# Patient Record
Sex: Female | Born: 1945 | Race: White | Hispanic: No | Smoking: Former smoker
Health system: Southern US, Community
[De-identification: ages and names within clinical notes are randomized; demographics above are authoritative.]

## PROBLEM LIST (undated history)

## (undated) DIAGNOSIS — K439 Ventral hernia without obstruction or gangrene: Secondary | ICD-10-CM

## (undated) DIAGNOSIS — A4902 Methicillin resistant Staphylococcus aureus infection, unspecified site: Secondary | ICD-10-CM

## (undated) DIAGNOSIS — E039 Hypothyroidism, unspecified: Secondary | ICD-10-CM

## (undated) DIAGNOSIS — F419 Anxiety disorder, unspecified: Secondary | ICD-10-CM

## (undated) DIAGNOSIS — C50411 Malignant neoplasm of upper-outer quadrant of right female breast: Secondary | ICD-10-CM

## (undated) DIAGNOSIS — I10 Essential (primary) hypertension: Secondary | ICD-10-CM

## (undated) DIAGNOSIS — Z170 Estrogen receptor positive status [ER+]: Secondary | ICD-10-CM

## (undated) DIAGNOSIS — I499 Cardiac arrhythmia, unspecified: Secondary | ICD-10-CM

## (undated) DIAGNOSIS — F329 Major depressive disorder, single episode, unspecified: Secondary | ICD-10-CM

## (undated) DIAGNOSIS — M1990 Unspecified osteoarthritis, unspecified site: Secondary | ICD-10-CM

## (undated) DIAGNOSIS — R7303 Prediabetes: Secondary | ICD-10-CM

## (undated) DIAGNOSIS — C55 Malignant neoplasm of uterus, part unspecified: Secondary | ICD-10-CM

## (undated) DIAGNOSIS — F32A Depression, unspecified: Secondary | ICD-10-CM

## (undated) DIAGNOSIS — Z87442 Personal history of urinary calculi: Secondary | ICD-10-CM

## (undated) DIAGNOSIS — K219 Gastro-esophageal reflux disease without esophagitis: Secondary | ICD-10-CM

## (undated) DIAGNOSIS — E785 Hyperlipidemia, unspecified: Secondary | ICD-10-CM

## (undated) DIAGNOSIS — K8020 Calculus of gallbladder without cholecystitis without obstruction: Secondary | ICD-10-CM

## (undated) HISTORY — PX: BUNIONECTOMY: SHX129

## (undated) HISTORY — PX: TUBAL LIGATION: SHX77

## (undated) HISTORY — PX: CHOLECYSTECTOMY: SHX55

## (undated) HISTORY — DX: Unspecified osteoarthritis, unspecified site: M19.90

## (undated) HISTORY — PX: EXCISION NEUROMA: SHX6350

## (undated) HISTORY — DX: Essential (primary) hypertension: I10

## (undated) HISTORY — DX: Malignant neoplasm of uterus, part unspecified: C55

## (undated) HISTORY — PX: NASAL SINUS SURGERY: SHX719

## (undated) SURGERY — CHOLECYSTECTOMY, ROBOT-ASSISTED, LAPAROSCOPIC
Anesthesia: General

---

## 2004-04-22 LAB — HM PAP SMEAR: HM Pap smear: NORMAL

## 2004-09-12 DIAGNOSIS — A4902 Methicillin resistant Staphylococcus aureus infection, unspecified site: Secondary | ICD-10-CM

## 2004-09-12 HISTORY — DX: Methicillin resistant Staphylococcus aureus infection, unspecified site: A49.02

## 2005-09-29 ENCOUNTER — Other Ambulatory Visit: Payer: Self-pay

## 2005-09-29 ENCOUNTER — Ambulatory Visit: Payer: Self-pay | Admitting: Family Medicine

## 2005-10-07 ENCOUNTER — Ambulatory Visit: Payer: Self-pay | Admitting: Podiatry

## 2007-09-13 DIAGNOSIS — C55 Malignant neoplasm of uterus, part unspecified: Secondary | ICD-10-CM

## 2007-09-13 HISTORY — PX: ABDOMINAL HYSTERECTOMY: SHX81

## 2007-09-13 HISTORY — DX: Malignant neoplasm of uterus, part unspecified: C55

## 2007-11-11 ENCOUNTER — Ambulatory Visit: Payer: Self-pay | Admitting: Gynecologic Oncology

## 2008-01-01 ENCOUNTER — Ambulatory Visit: Payer: Self-pay | Admitting: Gynecologic Oncology

## 2008-01-01 ENCOUNTER — Ambulatory Visit: Payer: Self-pay | Admitting: Radiation Oncology

## 2008-01-11 ENCOUNTER — Ambulatory Visit: Payer: Self-pay | Admitting: Radiation Oncology

## 2008-01-29 ENCOUNTER — Ambulatory Visit: Payer: Self-pay | Admitting: Radiation Oncology

## 2008-02-11 ENCOUNTER — Ambulatory Visit: Payer: Self-pay | Admitting: Radiation Oncology

## 2008-03-12 ENCOUNTER — Ambulatory Visit: Payer: Self-pay | Admitting: Radiation Oncology

## 2008-04-12 ENCOUNTER — Ambulatory Visit: Payer: Self-pay | Admitting: Radiation Oncology

## 2008-04-22 ENCOUNTER — Ambulatory Visit: Payer: Self-pay | Admitting: Gynecologic Oncology

## 2008-06-25 ENCOUNTER — Ambulatory Visit: Payer: Self-pay

## 2008-08-11 ENCOUNTER — Ambulatory Visit: Payer: Self-pay | Admitting: Podiatry

## 2008-08-12 ENCOUNTER — Ambulatory Visit: Payer: Self-pay | Admitting: Gynecologic Oncology

## 2008-08-26 ENCOUNTER — Ambulatory Visit: Payer: Self-pay | Admitting: Gynecologic Oncology

## 2008-09-12 ENCOUNTER — Ambulatory Visit: Payer: Self-pay | Admitting: Gynecologic Oncology

## 2008-09-16 ENCOUNTER — Encounter: Payer: Self-pay | Admitting: Podiatry

## 2008-10-09 ENCOUNTER — Ambulatory Visit: Payer: Self-pay | Admitting: Radiation Oncology

## 2008-10-13 ENCOUNTER — Ambulatory Visit: Payer: Self-pay | Admitting: Gynecologic Oncology

## 2009-01-20 ENCOUNTER — Ambulatory Visit: Payer: Self-pay | Admitting: Gynecologic Oncology

## 2009-02-10 ENCOUNTER — Ambulatory Visit: Payer: Self-pay | Admitting: Gynecologic Oncology

## 2009-03-12 ENCOUNTER — Ambulatory Visit: Payer: Self-pay | Admitting: Radiation Oncology

## 2009-03-19 ENCOUNTER — Ambulatory Visit: Payer: Self-pay | Admitting: Radiation Oncology

## 2009-04-12 ENCOUNTER — Ambulatory Visit: Payer: Self-pay | Admitting: Radiation Oncology

## 2009-05-26 LAB — HM DEXA SCAN

## 2009-07-13 ENCOUNTER — Ambulatory Visit: Payer: Self-pay | Admitting: Gynecologic Oncology

## 2009-07-21 ENCOUNTER — Ambulatory Visit: Payer: Self-pay | Admitting: Radiation Oncology

## 2009-08-03 ENCOUNTER — Ambulatory Visit: Payer: Self-pay | Admitting: Podiatry

## 2009-08-12 ENCOUNTER — Ambulatory Visit: Payer: Self-pay | Admitting: Radiation Oncology

## 2009-08-24 ENCOUNTER — Ambulatory Visit: Payer: Self-pay | Admitting: Podiatry

## 2009-09-30 ENCOUNTER — Ambulatory Visit: Payer: Self-pay | Admitting: Podiatry

## 2009-12-28 ENCOUNTER — Ambulatory Visit: Payer: Self-pay | Admitting: Family Medicine

## 2009-12-29 ENCOUNTER — Ambulatory Visit: Payer: Self-pay | Admitting: Family Medicine

## 2010-01-10 ENCOUNTER — Ambulatory Visit: Payer: Self-pay | Admitting: Gynecologic Oncology

## 2010-01-19 ENCOUNTER — Ambulatory Visit: Payer: Self-pay | Admitting: Gynecologic Oncology

## 2010-02-10 ENCOUNTER — Ambulatory Visit: Payer: Self-pay | Admitting: Gynecologic Oncology

## 2010-07-22 ENCOUNTER — Ambulatory Visit: Payer: Self-pay | Admitting: Radiation Oncology

## 2010-08-12 ENCOUNTER — Ambulatory Visit: Payer: Self-pay | Admitting: Radiation Oncology

## 2011-01-13 ENCOUNTER — Ambulatory Visit: Payer: Self-pay | Admitting: Family Medicine

## 2011-01-25 ENCOUNTER — Ambulatory Visit: Payer: Self-pay | Admitting: Radiation Oncology

## 2011-02-11 ENCOUNTER — Ambulatory Visit: Payer: Self-pay | Admitting: Radiation Oncology

## 2011-02-21 ENCOUNTER — Encounter: Payer: Self-pay | Admitting: Gynecologic Oncology

## 2011-03-13 ENCOUNTER — Encounter: Payer: Self-pay | Admitting: Gynecologic Oncology

## 2011-07-28 ENCOUNTER — Ambulatory Visit: Payer: Self-pay | Admitting: Radiation Oncology

## 2011-08-13 ENCOUNTER — Ambulatory Visit: Payer: Self-pay | Admitting: Radiation Oncology

## 2011-08-24 LAB — LIPID PANEL
Cholesterol: 197 mg/dL (ref 0–200)
HDL: 63 mg/dL (ref 35–70)
LDL Cholesterol: 102 mg/dL
LDl/HDL Ratio: 1.6
Triglycerides: 158 mg/dL (ref 40–160)

## 2011-09-23 ENCOUNTER — Emergency Department: Payer: Self-pay | Admitting: Emergency Medicine

## 2011-09-26 ENCOUNTER — Ambulatory Visit: Payer: Self-pay | Admitting: Family Medicine

## 2011-09-29 ENCOUNTER — Ambulatory Visit: Payer: Self-pay | Admitting: Family Medicine

## 2011-10-04 ENCOUNTER — Ambulatory Visit: Payer: Self-pay | Admitting: Family Medicine

## 2012-01-31 ENCOUNTER — Ambulatory Visit: Payer: Self-pay | Admitting: Gynecologic Oncology

## 2012-02-11 ENCOUNTER — Ambulatory Visit: Payer: Self-pay | Admitting: Gynecologic Oncology

## 2012-03-25 ENCOUNTER — Emergency Department: Payer: Self-pay | Admitting: *Deleted

## 2012-03-25 LAB — CBC
HCT: 44 % (ref 35.0–47.0)
HGB: 14.7 g/dL (ref 12.0–16.0)
MCH: 30.2 pg (ref 26.0–34.0)
MCHC: 33.3 g/dL (ref 32.0–36.0)
MCV: 91 fL (ref 80–100)
Platelet: 198 10*3/uL (ref 150–440)
RBC: 4.85 10*6/uL (ref 3.80–5.20)
RDW: 14 % (ref 11.5–14.5)
WBC: 8.3 10*3/uL (ref 3.6–11.0)

## 2012-03-25 LAB — COMPREHENSIVE METABOLIC PANEL
Albumin: 4 g/dL (ref 3.4–5.0)
Alkaline Phosphatase: 92 U/L (ref 50–136)
Anion Gap: 9 (ref 7–16)
BUN: 13 mg/dL (ref 7–18)
Bilirubin,Total: 0.3 mg/dL (ref 0.2–1.0)
Calcium, Total: 8.8 mg/dL (ref 8.5–10.1)
Chloride: 106 mmol/L (ref 98–107)
Co2: 28 mmol/L (ref 21–32)
Creatinine: 0.89 mg/dL (ref 0.60–1.30)
EGFR (African American): >60
EGFR (Non-African Amer.): >60
Glucose: 133 mg/dL — ABNORMAL HIGH (ref 65–99)
Osmolality: 287 (ref 275–301)
Potassium: 3.6 mmol/L (ref 3.5–5.1)
SGOT(AST): 28 U/L (ref 15–37)
SGPT (ALT): 25 U/L
Sodium: 143 mmol/L (ref 136–145)
Total Protein: 7.8 g/dL (ref 6.4–8.2)

## 2012-03-25 LAB — CK TOTAL AND CKMB (NOT AT ARMC)
CK, Total: 66 U/L (ref 21–215)
CK-MB: <0.5 ng/mL — ABNORMAL LOW (ref 0.5–3.6)

## 2012-03-25 LAB — TSH: Thyroid Stimulating Horm: 3.8 u[IU]/mL

## 2012-03-25 LAB — TROPONIN I: Troponin-I: <0.02 ng/mL

## 2012-09-11 ENCOUNTER — Ambulatory Visit: Payer: Self-pay | Admitting: Gynecologic Oncology

## 2012-09-12 ENCOUNTER — Ambulatory Visit: Payer: Self-pay | Admitting: Gynecologic Oncology

## 2012-11-05 ENCOUNTER — Ambulatory Visit: Payer: Self-pay | Admitting: Family Medicine

## 2013-07-15 ENCOUNTER — Ambulatory Visit: Payer: Self-pay | Admitting: Unknown Physician Specialty

## 2013-07-15 LAB — HM COLONOSCOPY

## 2013-07-16 LAB — PATHOLOGY REPORT

## 2014-05-08 ENCOUNTER — Ambulatory Visit: Payer: Self-pay

## 2014-05-16 ENCOUNTER — Ambulatory Visit: Payer: Self-pay | Admitting: Physician Assistant

## 2014-05-26 ENCOUNTER — Ambulatory Visit: Payer: Self-pay | Admitting: Family Medicine

## 2014-05-30 ENCOUNTER — Ambulatory Visit: Payer: Self-pay | Admitting: Family Medicine

## 2014-08-13 LAB — HEPATIC FUNCTION PANEL
ALT: 26 U/L (ref 7–35)
AST: 29 U/L (ref 13–35)
Alkaline Phosphatase: 74 U/L (ref 25–125)

## 2014-08-13 LAB — BASIC METABOLIC PANEL
BUN: 14 mg/dL (ref 4–21)
Creatinine: 1 mg/dL (ref 0.5–1.1)
Glucose: 155 mg/dL
Potassium: 4 mmol/L (ref 3.4–5.3)
Sodium: 141 mmol/L (ref 137–147)

## 2014-08-13 LAB — TSH: TSH: 3.42 u[IU]/mL (ref 0.41–5.90)

## 2014-08-13 LAB — CBC AND DIFFERENTIAL
HCT: 46 % (ref 36–46)
Hemoglobin: 15.2 g/dL (ref 12.0–16.0)
Neutrophils Absolute: 5 /uL
Platelets: 249 10*3/uL (ref 150–399)
WBC: 7.5 10^3/mL

## 2015-02-25 ENCOUNTER — Other Ambulatory Visit: Payer: Self-pay

## 2015-02-25 DIAGNOSIS — I10 Essential (primary) hypertension: Secondary | ICD-10-CM

## 2015-02-25 MED ORDER — QUINAPRIL HCL 40 MG PO TABS: 40.0000 mg | ORAL_TABLET | Freq: Every day | ORAL | Status: DC

## 2015-02-26 MED ORDER — QUINAPRIL HCL 40 MG PO TABS: 40.0000 mg | ORAL_TABLET | Freq: Every day | ORAL | Status: DC

## 2015-03-20 DIAGNOSIS — E039 Hypothyroidism, unspecified: Secondary | ICD-10-CM | POA: Insufficient documentation

## 2015-03-20 DIAGNOSIS — F32A Depression, unspecified: Secondary | ICD-10-CM | POA: Insufficient documentation

## 2015-03-20 DIAGNOSIS — F419 Anxiety disorder, unspecified: Secondary | ICD-10-CM | POA: Insufficient documentation

## 2015-03-20 DIAGNOSIS — M8580 Other specified disorders of bone density and structure, unspecified site: Secondary | ICD-10-CM | POA: Insufficient documentation

## 2015-03-20 DIAGNOSIS — C549 Malignant neoplasm of corpus uteri, unspecified: Secondary | ICD-10-CM | POA: Insufficient documentation

## 2015-03-20 DIAGNOSIS — G4700 Insomnia, unspecified: Secondary | ICD-10-CM | POA: Insufficient documentation

## 2015-03-20 DIAGNOSIS — F329 Major depressive disorder, single episode, unspecified: Secondary | ICD-10-CM | POA: Insufficient documentation

## 2015-03-20 DIAGNOSIS — I10 Essential (primary) hypertension: Secondary | ICD-10-CM | POA: Insufficient documentation

## 2015-03-20 DIAGNOSIS — D126 Benign neoplasm of colon, unspecified: Secondary | ICD-10-CM | POA: Insufficient documentation

## 2015-03-20 DIAGNOSIS — M1990 Unspecified osteoarthritis, unspecified site: Secondary | ICD-10-CM | POA: Insufficient documentation

## 2015-03-20 DIAGNOSIS — E785 Hyperlipidemia, unspecified: Secondary | ICD-10-CM | POA: Insufficient documentation

## 2015-04-21 ENCOUNTER — Ambulatory Visit (INDEPENDENT_AMBULATORY_CARE_PROVIDER_SITE_OTHER): Payer: Medicare Other | Admitting: Family Medicine

## 2015-04-21 ENCOUNTER — Encounter: Payer: Self-pay | Admitting: Family Medicine

## 2015-04-21 VITALS — BP 122/68 | HR 76 | Temp 98.0°F | Resp 16 | Wt 198.0 lb

## 2015-04-21 DIAGNOSIS — I10 Essential (primary) hypertension: Secondary | ICD-10-CM | POA: Diagnosis not present

## 2015-04-21 DIAGNOSIS — E785 Hyperlipidemia, unspecified: Secondary | ICD-10-CM | POA: Diagnosis not present

## 2015-04-21 DIAGNOSIS — E039 Hypothyroidism, unspecified: Secondary | ICD-10-CM | POA: Diagnosis not present

## 2015-04-21 NOTE — Progress Notes (Signed)
Patient ID: Rebecca Patton, female   DOB: 08-21-1946, 69 y.o.   MRN: [id number] Patient: Rebecca Patton, Female    DOB: 1946/06/28, 69 y.o.   MRN: [id number] Visit Date: 04/21/2015  Today's Provider: [name], MD   Subjective:  1. Essential hypertension Patient is a 69 year old female who presents for follow up of her hypertension.  Her last visit was 10/15/14.  Her pressure at that time was 126/68.  No changes in management at that time.  She did complain that her feet were swelling on the Amlodipine and in April she discontinued taking it.  She has not noticed any change in her blood pressure. Her readings from outside the office are running 130s-150s over 70s-90.  She is currently taking Quinapril and HCTZ.  She reports good compliance and tolerance.     2. Hypothyroidism, unspecified hypothyroidism type She is also here for follow up on her thyroid.  Her last TSH was 08/13/14.  She is currently on Synthroid and does not know of any symptoms related to that disease.  - TSH  3. Hyperlipidemia Patient has not had her lipids done since 2012.  She is not currently on any medication but has had elevated lipids in the past that have never required medication.    - Lipid Panel With LDL/HDL Ratio   Review of Systems  Constitutional: Negative for fever, chills, diaphoresis, fatigue and unexpected weight change.  Eyes: Negative.   Respiratory: Negative.  Negative for cough, choking, chest tightness, shortness of breath and wheezing.   Cardiovascular: Positive for leg swelling. Negative for chest pain and palpitations.  Endocrine: Negative for cold intolerance, heat intolerance, polydipsia, polyphagia and polyuria.  Neurological: Positive for light-headedness (Pastient believes this is from her eye glasses.).  Psychiatric/Behavioral: Negative.     History   Social History  . Marital Status: Married    Spouse Name: N/A  . Number of Children: N/A  . Years of Education: N/A   Occupational  History  . Not on file.   Social History Main Topics  . Smoking status: Former Smoker    Quit date: 09/11/1997  . Smokeless tobacco: Not on file  . Alcohol Use: No  . Drug Use: No  . Sexual Activity: Not on file   Other Topics Concern  . Not on file   Social History Narrative    Patient Active Problem List   Diagnosis Date Noted  . Anxiety 03/20/2015  . Arthritis 03/20/2015  . 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 Clinical depression 03/20/2015  . Malignant neoplasm of corpus uteri, except isthmus 03/20/2015  . Essential (primary) hypertension 03/20/2015  . HLD (hyperlipidemia) 03/20/2015  . Cannot sleep 03/20/2015  . Osteopenia 03/20/2015  . Adult hypothyroidism 03/20/2015    Past Surgical History  Procedure Laterality Date  . Abdominal hysterectomy  2009     BSO  . Nasal sinus surgery    . Excision neuroma    . Bunionectomy    . Tubal ligation      Her family history includes CAD in her father; Cancer in her sister; Diabetes in her father; Pancreatic cancer in her brother; Parkinson's disease in her mother; Tremor in her brother.    Outpatient Prescriptions Prior to Visit  Medication Sig Dispense Refill  . aspirin 81 MG tablet Take by mouth.    . hydrochlorothiazide (HYDRODIURIL) 25 MG tablet Take by mouth.    . MULTIPLE VITAMIN PO Take by mouth.    . OMEGA-3 FATTY  ACIDS PO Take by mouth.    . quinapril (ACCUPRIL) 40 MG tablet Take 1 tablet (40 mg total) by mouth daily. 90 tablet 3  . amLODipine (NORVASC) 5 MG tablet Take by mouth.    . levothyroxine (SYNTHROID) 50 MCG tablet Take by mouth.    . meloxicam (MOBIC) 15 MG tablet Take by mouth.    . Pyridoxine HCl (VITAMIN B6) 250 MG TABS Take by mouth.     No facility-administered medications prior to visit.    Patient Care Team: Jerrol Banana., MD as PCP - General (Family Medicine)     Objective:   Vitals:  Filed Vitals:   04/21/15 1446  BP: 122/68  Pulse: 76  Temp: 98 F (36.7 C)   TempSrc: Oral  Resp: 16  Weight: 198 lb (89.812 kg)    Physical Exam  Constitutional: She is oriented to person, place, and time. She appears well-developed and well-nourished.  HENT:  Head: Normocephalic and atraumatic.  Right Ear: External ear normal.  Left Ear: External ear normal.  Nose: Nose normal.  Eyes: Conjunctivae are normal.  Neck: Neck supple.  Cardiovascular: Normal rate, regular rhythm and normal heart sounds.   Pulmonary/Chest: Effort normal and breath sounds normal.  Abdominal: Soft.  Neurological: She is alert and oriented to person, place, and time.  Skin: Skin is warm and dry.  Psychiatric: She has a normal mood and affect. Her behavior is normal. Judgment and thought content normal.       Assessment & Plan:   HTN HLD Hypothyroid Check labs. I have done the exam and reviewed the above chart and it is accurate to the best of my knowledge.  Exercise Activities and Dietary recommendations Goals    None      Immunization History  Administered Date(s) Administered  . Pneumococcal Conjugate-13 08/13/2013  . Zoster 07/03/2012    Health Maintenance  Topic Date Due  . Hepatitis C Screening  06-24-1946  . TETANUS/TDAP  12/20/1966  . MAMMOGRAM  12/19/1997  . COLONOSCOPY  12/19/1997  . PNA vac Low Risk Adult (2 of 2 - PPSV23) 08/13/2014  . INFLUENZA VACCINE  04/13/2015  . DEXA SCAN  Completed  . ZOSTAVAX  Completed       ------------------------------------------------------------------------------------------------------------

## 2015-04-23 LAB — CBC WITH DIFFERENTIAL/PLATELET

## 2015-04-23 LAB — LIPID PANEL WITH LDL/HDL RATIO
Cholesterol, Total: 225 mg/dL — ABNORMAL HIGH (ref 100–199)
HDL: 60 mg/dL (ref >39)
LDL Calculated: 128 mg/dL — ABNORMAL HIGH (ref 0–99)
LDl/HDL Ratio: 2.1 ratio units (ref 0.0–3.2)
Triglycerides: 184 mg/dL — ABNORMAL HIGH (ref 0–149)
VLDL Cholesterol Cal: 37 mg/dL (ref 5–40)

## 2015-04-23 LAB — TSH: TSH: 3.09 u[IU]/mL (ref 0.450–4.500)

## 2015-10-06 ENCOUNTER — Encounter: Payer: Self-pay | Admitting: Obstetrics and Gynecology

## 2015-10-06 ENCOUNTER — Ambulatory Visit (INDEPENDENT_AMBULATORY_CARE_PROVIDER_SITE_OTHER): Payer: Medicare Other | Admitting: Obstetrics and Gynecology

## 2015-10-06 VITALS — BP 146/65 | HR 78 | Ht 66.0 in | Wt 202.9 lb

## 2015-10-06 DIAGNOSIS — Z01419 Encounter for gynecological examination (general) (routine) without abnormal findings: Secondary | ICD-10-CM | POA: Diagnosis not present

## 2015-10-06 NOTE — Patient Instructions (Signed)
  Place annual gynecologic exam patient instructions here.  Thank you for enrolling in [facility]. Please follow the instructions below to securely access your online medical record. MyChart allows you to send messages to your doctor, view your test results, manage appointments, and more.   How Do I Sign Up? 1. In your Internet browser, go to [employer] and enter [email or web address]. 2. Click on the Sign Up Now link in the Sign In box. You will see the New Member Sign Up page. 3. Enter your MyChart Access Code exactly as it appears below. You will not need to use this code after you've completed the sign-up process. If you do not sign up before the expiration date, you must request a new code.  MyChart Access Code: [email or web address] Expires: 12/05/2015  1:59 PM  4. Enter your Social Security Number ([id number]) and Date of Birth (mm/dd/yyyy) as indicated and click Submit. You will be taken to the next sign-up page. 5. Create a MyChart ID. This will be your MyChart login ID and cannot be changed, so think of one that is secure and easy to remember. 6. Create a MyChart password. You can change your password at any time. 7. Enter your Password Reset Question and Answer. This can be used at a later time if you forget your password.  8. Enter your e-mail address. You will receive e-mail notification when new information is available in [place]. 9. Click Sign Up. You can now view your medical record.   Additional Information Remember, MyChart is NOT to be used for urgent needs. For medical emergencies, dial 911.

## 2015-10-06 NOTE — Progress Notes (Signed)
Subjective:   Rebecca Patton is a 70 y.o. No obstetric history on file. Caucasian female here for a routine well-woman exam.  No LMP recorded. Patient has had a hysterectomy.    Current complaints: none PCP: Rosanna Randy       Doesn't need labs as PCP did them in Aug. 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  Past Medical History History reviewed. No pertinent past medical history.  Past Surgical History Past Surgical History  Procedure Laterality Date  . Abdominal hysterectomy  2009     BSO  . Nasal sinus surgery    . Excision neuroma    . Bunionectomy    . Tubal ligation      Gynecologic History No obstetric history on file.  No LMP recorded. Patient has had a hysterectomy. Contraception: status post hysterectomy Last Pap: 2015. Results were: normal Last mammogram: 2016. Results were: normal   Obstetric History OB History  No data available    Current Medications Current Outpatient Prescriptions on File Prior to Visit  Medication Sig Dispense Refill  . aspirin 81 MG tablet Take by mouth.    . hydrochlorothiazide (HYDRODIURIL) 25 MG tablet Take by mouth.    . levothyroxine (SYNTHROID) 50 MCG tablet Take by mouth.    . MULTIPLE VITAMIN PO Take by mouth.    . OMEGA-3 FATTY ACIDS PO Take by mouth.    . Pyridoxine HCl (VITAMIN B6) 250 MG TABS Take by mouth.    . quinapril (ACCUPRIL) 40 MG tablet Take 1 tablet (40 mg total) by mouth daily. 90 tablet 3  . amLODipine (NORVASC) 5 MG tablet Take by mouth. Reported on 10/06/2015    . meloxicam (MOBIC) 15 MG tablet Take by  mouth. Reported on 10/06/2015     No current facility-administered medications on file prior to visit.    Review of Systems Patient denies any headaches, blurred vision, shortness of breath, chest pain, abdominal pain, problems with bowel movements, urination, or intercourse.  Objective:  BP 146/65 mmHg  Pulse 78  Ht 5\' 6"  (1.676 m)  Wt 202 lb 14.4 oz (92.035 kg)  BMI 32.76 kg/m2 Physical Exam  General:  Well developed, well nourished, no acute distress. She is alert and oriented x3. Skin:  Warm and dry Neck:  Midline trachea, no thyromegaly or nodules Cardiovascular: Regular rate and rhythm, no murmur heard Lungs:  Effort normal, all lung fields clear to auscultation bilaterally Breasts:  No dominant palpable mass, retraction, or nipple discharge Abdomen:  Soft, non tender, no hepatosplenomegaly or masses Pelvic:  External genitalia is normal in appearance.  The vagina is atrophic and shortened. The cervix is surgically absent.  Thin prep pap is not done  Uterus is surgically absent  No adnexal masses or tenderness noted. Extremities:  No swelling or varicosities noted Psych:  She has a normal mood and affect  Assessment:   Healthy well-woman exam S/p complete hysterectomy  Plan:  No labs neded F/U 1 year for AE, or sooner if needed Mammogram scheduled  Johnnye Sandford Rockney Ghee, CNM

## 2015-10-27 ENCOUNTER — Encounter: Payer: Medicare Other | Admitting: Family Medicine

## 2015-10-28 ENCOUNTER — Other Ambulatory Visit: Payer: Self-pay | Admitting: Family Medicine

## 2015-11-02 ENCOUNTER — Other Ambulatory Visit: Payer: Self-pay

## 2015-11-02 MED ORDER — HYDROCHLOROTHIAZIDE 25 MG PO TABS: 25.0000 mg | ORAL_TABLET | Freq: Every day | ORAL | Status: DC

## 2015-11-05 ENCOUNTER — Encounter: Payer: Self-pay | Admitting: Family Medicine

## 2015-11-05 ENCOUNTER — Ambulatory Visit (INDEPENDENT_AMBULATORY_CARE_PROVIDER_SITE_OTHER): Payer: Medicare Other | Admitting: Family Medicine

## 2015-11-05 VITALS — BP 130/72 | HR 72 | Temp 98.0°F | Resp 18 | Wt 205.0 lb

## 2015-11-05 DIAGNOSIS — E785 Hyperlipidemia, unspecified: Secondary | ICD-10-CM | POA: Diagnosis not present

## 2015-11-05 DIAGNOSIS — E039 Hypothyroidism, unspecified: Secondary | ICD-10-CM

## 2015-11-05 DIAGNOSIS — I10 Essential (primary) hypertension: Secondary | ICD-10-CM | POA: Diagnosis not present

## 2015-11-05 NOTE — Progress Notes (Signed)
Patient ID: Rebecca Patton, female   DOB: 10-28-1945, 70 y.o.   MRN: [id number]    Subjective:  HPI  Hypertension, follow-up:  BP Readings from Last 3 Encounters:  11/05/15 130/72  10/06/15 146/65  04/21/15 122/68    She was last seen for hypertension 6 months ago.  BP at that visit was 146/55. Management since that visit includes none. She reports good compliance with treatment. She is not having side effects.  She is exercising. She is adherent to low salt diet.   Outside blood pressures are 130-140's/60's. She is experiencing none.  Patient denies chest pain, chest pressure/discomfort, claudication, dyspnea, exertional chest pressure/discomfort, fatigue, lower extremity edema, orthopnea and palpitations.     Wt Readings from Last 3 Encounters:  11/05/15 205 lb (92.987 kg)  10/06/15 202 lb 14.4 oz (92.035 kg)  04/21/15 198 lb (89.812 kg)   -----------------------------------------------------------------------     Prior to Admission medications   Medication Sig Start Date End Date Taking? Authorizing Provider  amLODipine (NORVASC) 5 MG tablet Take by mouth. Reported on 10/06/2015 08/13/14  Yes Historical Provider, MD  aspirin 81 MG tablet Take by mouth.   Yes Historical Provider, MD  hydrochlorothiazide (HYDRODIURIL) 25 MG tablet Take 1 tablet (25 mg total) by mouth daily. 11/02/15  Yes Richard Maceo Pro., MD  levothyroxine (SYNTHROID) 50 MCG tablet Take by mouth. 12/02/14  Yes Historical Provider, MD  meloxicam (MOBIC) 15 MG tablet Take by mouth. Reported on 10/06/2015 10/15/14  Yes Historical Provider, MD  MULTIPLE VITAMIN PO Take by mouth.   Yes Historical Provider, MD  OMEGA-3 FATTY ACIDS PO Take by mouth.   Yes Historical Provider, MD  Pyridoxine HCl (VITAMIN B6) 250 MG TABS Take by mouth daily.    Yes Historical Provider, MD  quinapril (ACCUPRIL) 40 MG tablet Take 1 tablet (40 mg total) by mouth daily. 02/26/15  Yes Richard Maceo Pro., MD    Patient Active Problem  List   Diagnosis Date Noted  . Anxiety 03/20/2015  . Arthritis 03/20/2015  . 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 Clinical depression 03/20/2015  . Malignant neoplasm of corpus uteri, except isthmus ([place]) 03/20/2015  . Essential (primary) hypertension 03/20/2015  . HLD (hyperlipidemia) 03/20/2015  . Cannot sleep 03/20/2015  . Osteopenia 03/20/2015  . Adult hypothyroidism 03/20/2015    History reviewed. No pertinent past medical history.  Social History   Social History  . Marital Status: Married    Spouse Name: N/A  . Number of Children: N/A  . Years of Education: N/A   Occupational History  . Not on file.   Social History Main Topics  . Smoking status: Former Smoker    Quit date: 09/11/1997  . Smokeless tobacco: Not on file  . Alcohol Use: No  . Drug Use: No  . Sexual Activity: Not Currently   Other Topics Concern  . Not on file   Social History Narrative    No Known Allergies  Review of Systems  Constitutional: Negative.   HENT: Negative.   Eyes: Negative.   Respiratory: Negative.   Cardiovascular: Positive for leg swelling.  Gastrointestinal: Negative.   Genitourinary: Negative.   Musculoskeletal: Positive for back pain.  Skin: Negative.   Neurological: Negative.   Endo/Heme/Allergies: Negative.   Psychiatric/Behavioral: Negative.     Immunization History  Administered Date(s) Administered  . Influenza-Unspecified 06/13/2015  . Pneumococcal Conjugate-13 08/13/2013  . Zoster 07/03/2012   Objective:  BP 130/72 mmHg  Pulse 72  Temp(Src) 98 F (  36.7 C) (Oral)  Resp 18  Wt 205 lb (92.987 kg)  Physical Exam  Constitutional: She is oriented to person, place, and time and well-developed, well-nourished, and in no distress.  Eyes: Conjunctivae and EOM are normal. Pupils are equal, round, and reactive to light.  Neck: Normal range of motion. Neck supple.  Cardiovascular: Normal rate, regular rhythm, normal heart sounds and intact distal pulses.     Pulmonary/Chest: Effort normal and breath sounds normal.  Musculoskeletal: Normal range of motion.  Neurological: She is alert and oriented to person, place, and time. She has normal reflexes. Gait normal. GCS score is 15.  Skin: Skin is warm and dry.  Psychiatric: Mood, memory, affect and judgment normal.    Lab Results  Component Value Date   WBC CANCELED 04/21/2015   HGB 15.2 08/13/2014   HCT CANCELED 04/21/2015   PLT CANCELED 04/21/2015   GLUCOSE 133* 03/25/2012   CHOL 225* 04/21/2015   TRIG 184* 04/21/2015   HDL 60 04/21/2015   LDLCALC 128* 04/21/2015   TSH 3.090 04/21/2015    CMP     Component Value Date/Time   NA 141 08/13/2014   NA 143 03/25/2012 2049   K 4.0 08/13/2014   K 3.6 03/25/2012 2049   CL 106 03/25/2012 2049   CO2 28 03/25/2012 2049   GLUCOSE 133* 03/25/2012 2049   BUN 14 08/13/2014   BUN 13 03/25/2012 2049   CREATININE 1.0 08/13/2014   CREATININE 0.89 03/25/2012 2049   CALCIUM 8.8 03/25/2012 2049   PROT 7.8 03/25/2012 2049   ALBUMIN 4.0 03/25/2012 2049   AST 29 08/13/2014   AST 28 03/25/2012 2049   ALT 26 08/13/2014   ALT 25 03/25/2012 2049   ALKPHOS 74 08/13/2014   ALKPHOS 92 03/25/2012 2049   BILITOT 0.3 03/25/2012 2049   GFRNONAA >60 03/25/2012 2049   GFRAA >60 03/25/2012 2049    Assessment and Plan :  1. Hypothyroidism, unspecified hypothyroidism type  - TSH  2. Essential (primary) hypertension Stable. - CBC with Differential/Platelet - Comprehensive metabolic panel  3. HLD (hyperlipidemia)  - Lipid Panel With LDL/HDL Ratio   Patient was seen and examined by Dr. Miguel Aschoff, and noted scribed by Webb Laws, [place] MD [facility] 11/05/2015 1:50 PM

## 2015-11-07 LAB — CBC WITH DIFFERENTIAL/PLATELET
Basophils Absolute: 0 10*3/uL (ref 0.0–0.2)
Basos: 0 %
EOS (ABSOLUTE): 0.1 10*3/uL (ref 0.0–0.4)
Eos: 2 %
Hematocrit: 42.8 % (ref 34.0–46.6)
Hemoglobin: 14.5 g/dL (ref 11.1–15.9)
Immature Grans (Abs): 0 10*3/uL (ref 0.0–0.1)
Immature Granulocytes: 0 %
Lymphocytes Absolute: 1.5 10*3/uL (ref 0.7–3.1)
Lymphs: 25 %
MCH: 29.1 pg (ref 26.6–33.0)
MCHC: 33.9 g/dL (ref 31.5–35.7)
MCV: 86 fL (ref 79–97)
Monocytes Absolute: 0.4 10*3/uL (ref 0.1–0.9)
Monocytes: 8 %
Neutrophils Absolute: 3.8 10*3/uL (ref 1.4–7.0)
Neutrophils: 65 %
Platelets: 245 10*3/uL (ref 150–379)
RBC: 4.98 x10E6/uL (ref 3.77–5.28)
RDW: 13.9 % (ref 12.3–15.4)
WBC: 5.9 10*3/uL (ref 3.4–10.8)

## 2015-11-07 LAB — LIPID PANEL WITH LDL/HDL RATIO
Cholesterol, Total: 217 mg/dL — ABNORMAL HIGH (ref 100–199)
HDL: 58 mg/dL (ref >39)
LDL Calculated: 126 mg/dL — ABNORMAL HIGH (ref 0–99)
LDl/HDL Ratio: 2.2 ratio units (ref 0.0–3.2)
Triglycerides: 164 mg/dL — ABNORMAL HIGH (ref 0–149)
VLDL Cholesterol Cal: 33 mg/dL (ref 5–40)

## 2015-11-07 LAB — COMPREHENSIVE METABOLIC PANEL
ALT: 22 IU/L (ref 0–32)
AST: 24 IU/L (ref 0–40)
Albumin/Globulin Ratio: 1.5 (ref 1.1–2.5)
Albumin: 4.1 g/dL (ref 3.6–4.8)
Alkaline Phosphatase: 72 IU/L (ref 39–117)
BUN/Creatinine Ratio: 18 (ref 11–26)
BUN: 17 mg/dL (ref 8–27)
Bilirubin Total: 0.3 mg/dL (ref 0.0–1.2)
CO2: 25 mmol/L (ref 18–29)
Calcium: 9.6 mg/dL (ref 8.7–10.3)
Chloride: 99 mmol/L (ref 96–106)
Creatinine, Ser: 0.95 mg/dL (ref 0.57–1.00)
GFR calc Af Amer: 72 mL/min/{1.73_m2} (ref >59)
GFR calc non Af Amer: 62 mL/min/{1.73_m2} (ref >59)
Globulin, Total: 2.7 g/dL (ref 1.5–4.5)
Glucose: 104 mg/dL — ABNORMAL HIGH (ref 65–99)
Potassium: 4.1 mmol/L (ref 3.5–5.2)
Sodium: 141 mmol/L (ref 134–144)
Total Protein: 6.8 g/dL (ref 6.0–8.5)

## 2015-11-07 LAB — TSH: TSH: 3.68 u[IU]/mL (ref 0.450–4.500)

## 2015-11-10 ENCOUNTER — Encounter: Payer: Self-pay | Admitting: Family Medicine

## 2015-11-10 ENCOUNTER — Ambulatory Visit (INDEPENDENT_AMBULATORY_CARE_PROVIDER_SITE_OTHER): Payer: Medicare Other | Admitting: Family Medicine

## 2015-11-10 VITALS — BP 160/78 | HR 72 | Temp 98.2°F | Resp 16 | Wt 206.2 lb

## 2015-11-10 DIAGNOSIS — R05 Cough: Secondary | ICD-10-CM

## 2015-11-10 DIAGNOSIS — J069 Acute upper respiratory infection, unspecified: Secondary | ICD-10-CM

## 2015-11-10 DIAGNOSIS — R059 Cough, unspecified: Secondary | ICD-10-CM

## 2015-11-10 MED ORDER — HYDROCODONE-HOMATROPINE 5-1.5 MG/5ML PO SYRP: 5.0000 mL | ORAL_SOLUTION | Freq: Three times a day (TID) | ORAL | Status: DC | PRN

## 2015-11-10 MED ORDER — DOXYCYCLINE HYCLATE 100 MG PO TABS: 100.0000 mg | ORAL_TABLET | Freq: Two times a day (BID) | ORAL | Status: DC

## 2015-11-10 NOTE — Progress Notes (Signed)
Patient ID: Rebecca Patton, female   DOB: 01/17/1946, 70 y.o.   MRN: [id number]   Patient: Rebecca Patton Female    DOB: 1946-04-04   70 y.o.   MRN: [id number] Visit Date: 11/10/2015  Today's Provider: Vernie Murders, PA   Chief Complaint  Patient presents with  . Cough   Subjective:    Cough This is a new problem. Episode onset: onset past 1-2 days. The problem has been unchanged. The cough is productive of sputum. Associated symptoms include nasal congestion, postnasal drip and a sore throat. Pertinent negatives include no ear pain, fever or headaches. Associated symptoms comments: Congestion . Treatments tried: OTC medication. The treatment provided mild relief.    History reviewed. No pertinent past medical history. Patient Active Problem List   Diagnosis Date Noted  . Anxiety 03/20/2015  . Arthritis 03/20/2015  . 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 Clinical depression 03/20/2015  . Malignant neoplasm of corpus uteri, except isthmus ([place]) 03/20/2015  . Essential (primary) hypertension 03/20/2015  . HLD (hyperlipidemia) 03/20/2015  . Cannot sleep 03/20/2015  . Osteopenia 03/20/2015  . Adult hypothyroidism 03/20/2015   Past Surgical History  Procedure Laterality Date  . Abdominal hysterectomy  2009     BSO  . Nasal sinus surgery    . Excision neuroma    . Bunionectomy    . Tubal ligation     Family History  Problem Relation Age of Onset  . Parkinson's disease Mother   . Diabetes Father   . CAD Father   . Cancer Sister     squamous cell cancer in her eye  . Pancreatic cancer Brother   . Tremor Brother    Previous Medications   AMLODIPINE (NORVASC) 5 MG TABLET    Take by mouth. Reported on 10/06/2015   ASPIRIN 81 MG TABLET    Take by mouth.   HYDROCHLOROTHIAZIDE (HYDRODIURIL) 25 MG TABLET    Take 1 tablet (25 mg total) by mouth daily.   LEVOTHYROXINE (SYNTHROID) 50 MCG TABLET    Take by mouth.   MELOXICAM (MOBIC) 15 MG TABLET    Take by mouth. Reported on 10/06/2015    MULTIPLE VITAMIN PO    Take by mouth.   OMEGA-3 FATTY ACIDS PO    Take by mouth.   PYRIDOXINE HCL (VITAMIN B6) 250 MG TABS    Take by mouth daily.    QUINAPRIL (ACCUPRIL) 40 MG TABLET    Take 1 tablet (40 mg total) by mouth daily.   No Known Allergies  Review of Systems  Constitutional: Negative.  Negative for fever.  HENT: Positive for congestion, postnasal drip and sore throat. Negative for ear pain.   Eyes: Negative.   Respiratory: Positive for cough.   Cardiovascular: Negative.   Gastrointestinal: Negative.   Endocrine: Negative.   Genitourinary: Negative.   Musculoskeletal: Negative.   Skin: Negative.   Allergic/Immunologic: Negative.   Neurological: Negative.  Negative for headaches.  Hematological: Negative.   Psychiatric/Behavioral: Negative.     Social History  Substance Use Topics  . Smoking status: Former Smoker    Quit date: 09/11/1997  . Smokeless tobacco: Not on file  . Alcohol Use: No   Objective:   BP 160/78 mmHg  Pulse 72  Temp(Src) 98.2 F (36.8 C) (Oral)  Resp 16  Wt 206 lb 3.2 oz (93.532 kg)  Physical Exam  Constitutional: She is oriented to person, place, and time. She appears well-developed and well-nourished. No distress.  HENT:  Head: Normocephalic  and atraumatic.  Right Ear: Hearing and external ear normal.  Left Ear: Hearing and external ear normal.  Nose: Nose normal.  Slightly erythematous posterior pharynx with cobblestone appearance. No sinus tenderness.  Eyes: Conjunctivae, EOM and lids are normal. Right eye exhibits no discharge. Left eye exhibits no discharge. No scleral icterus.  Cardiovascular: Normal rate, regular rhythm and normal heart sounds.   Pulmonary/Chest: Effort normal and breath sounds normal. No respiratory distress.  Musculoskeletal: Normal range of motion.  Neurological: She is alert and oriented to person, place, and time.  Skin: Skin is intact. No lesion and no rash noted.  Psychiatric: She has a normal mood  and affect. Her speech is normal and behavior is normal. Thought content normal.      Assessment & Plan:     1. Cough Onset over the past 2-3 days without fever. Some clear to white sputum occasionally. Feel some postnasal drip but no wheeze or dyspnea. May use Mucinex-DM for daytime cough and Hycodan in the evenings. Increase fluid intake and may use Claritin or Zyrtec prn rhinorrhea and sneezing. Recheck prn. - HYDROcodone-homatropine (HYCODAN) 5-1.5 MG/5ML syrup; Take 5 mLs by mouth every 8 (eight) hours as needed for cough.  Dispense: 120 mL; Refill: 0  2. Upper respiratory infection Onset with ticklish cough over the past 2-3 days. Nasal stuffiness and postnasal drip with some sneezing. Use OTC antihistamine and cough medications as above. May use Tylenol prn headache, body aches or pain. Given written prescription for antibiotic (concerned about getting "walking pneumonia" she had January 2016) which she can fill if any signs of purulent sputum or fever. Recheck prn. - doxycycline (VIBRA-TABS) 100 MG tablet; Take 1 tablet (100 mg total) by mouth 2 (two) times daily.  Dispense: 20 tablet; Refill: 0

## 2015-11-10 NOTE — Patient Instructions (Signed)
Upper Respiratory Infection, Adult Most upper respiratory infections (URIs) are a viral infection of the air passages leading to the lungs. A URI affects the nose, throat, and upper air passages. The most common type of URI is nasopharyngitis and is typically referred to as "the common cold." URIs run their course and usually go away on their own. Most of the time, a URI does not require medical attention, but sometimes a bacterial infection in the upper airways can follow a viral infection. This is called a secondary infection. Sinus and middle ear infections are common types of secondary upper respiratory infections. Bacterial pneumonia can also complicate a URI. A URI can worsen asthma and chronic obstructive pulmonary disease (COPD). Sometimes, these complications can require emergency medical care and may be life threatening.  CAUSES Almost all URIs are caused by viruses. A virus is a type of germ and can spread from one person to another.  RISKS FACTORS You may be at risk for a URI if:   You smoke.   You have chronic heart or lung disease.  You have a weakened defense (immune) system.   You are very young or very old.   You have nasal allergies or asthma.  You work in crowded or poorly ventilated areas.  You work in health care facilities or schools. SIGNS AND SYMPTOMS  Symptoms typically develop 2-3 days after you come in contact with a cold virus. Most viral URIs last 7-10 days. However, viral URIs from the influenza virus (flu virus) can last 14-18 days and are typically more severe. Symptoms may include:   Runny or stuffy (congested) nose.   Sneezing.   Cough.   Sore throat.   Headache.   Fatigue.   Fever.   Loss of appetite.   Pain in your forehead, behind your eyes, and over your cheekbones (sinus pain).  Muscle aches.  DIAGNOSIS  Your health care provider may diagnose a URI by:  Physical exam.  Tests to check that your symptoms are not due to  another condition such as:  Strep throat.  Sinusitis.  Pneumonia.  Asthma. TREATMENT  A URI goes away on its own with time. It cannot be cured with medicines, but medicines may be prescribed or recommended to relieve symptoms. Medicines may help:  Reduce your fever.  Reduce your cough.  Relieve nasal congestion. HOME CARE INSTRUCTIONS   Take medicines only as directed by your health care provider.   Gargle warm saltwater or take cough drops to comfort your throat as directed by your health care provider.  Use a warm mist humidifier or inhale steam from a shower to increase air moisture. This may make it easier to breathe.  Drink enough fluid to keep your urine clear or pale yellow.   Eat soups and other clear broths and maintain good nutrition.   Rest as needed.   Return to work when your temperature has returned to normal or as your health care provider advises. You may need to stay home longer to avoid infecting others. You can also use a face mask and careful hand washing to prevent spread of the virus.  Increase the usage of your inhaler if you have asthma.   Do not use any tobacco products, including cigarettes, chewing tobacco, or electronic cigarettes. If you need help quitting, ask your health care provider. PREVENTION  The best way to protect yourself from getting a cold is to practice good hygiene.   Avoid oral or hand contact with people with cold   symptoms.   Wash your hands often if contact occurs.  There is no clear evidence that vitamin C, vitamin E, echinacea, or exercise reduces the chance of developing a cold. However, it is always recommended to get plenty of rest, exercise, and practice good nutrition.  SEEK MEDICAL CARE IF:   You are getting worse rather than better.   Your symptoms are not controlled by medicine.   You have chills.  You have worsening shortness of breath.  You have brown or red mucus.  You have yellow or brown nasal  discharge.  You have pain in your face, especially when you bend forward.  You have a fever.  You have swollen neck glands.  You have pain while swallowing.  You have white areas in the back of your throat. SEEK IMMEDIATE MEDICAL CARE IF:   You have severe or persistent:  Headache.  Ear pain.  Sinus pain.  Chest pain.  You have chronic lung disease and any of the following:  Wheezing.  Prolonged cough.  Coughing up blood.  A change in your usual mucus.  You have a stiff neck.  You have changes in your:  Vision.  Hearing.  Thinking.  Mood. MAKE SURE YOU:   Understand these instructions.  Will watch your condition.  Will get help right away if you are not doing well or get worse.   This information is not intended to replace advice given to you by your health care provider. Make sure you discuss any questions you have with your health care provider.   Document Released: 02/22/2001 Document Revised: 01/13/2015 Document Reviewed: 12/04/2013 Elsevier Interactive Patient Education 2016 Elsevier Inc.  

## 2016-01-27 ENCOUNTER — Other Ambulatory Visit: Payer: Self-pay

## 2016-01-27 DIAGNOSIS — I10 Essential (primary) hypertension: Secondary | ICD-10-CM

## 2016-01-27 MED ORDER — LEVOTHYROXINE SODIUM 50 MCG PO TABS: 50.0000 ug | ORAL_TABLET | Freq: Every day | ORAL | Status: DC

## 2016-01-27 MED ORDER — QUINAPRIL HCL 40 MG PO TABS: 40.0000 mg | ORAL_TABLET | Freq: Every day | ORAL | Status: DC

## 2016-05-04 ENCOUNTER — Encounter: Payer: Self-pay | Admitting: Family Medicine

## 2016-05-04 ENCOUNTER — Ambulatory Visit (INDEPENDENT_AMBULATORY_CARE_PROVIDER_SITE_OTHER): Payer: Medicare Other | Admitting: Family Medicine

## 2016-05-04 VITALS — BP 128/60 | HR 76 | Temp 98.6°F | Resp 16 | Wt 195.0 lb

## 2016-05-04 DIAGNOSIS — E785 Hyperlipidemia, unspecified: Secondary | ICD-10-CM

## 2016-05-04 DIAGNOSIS — G5793 Unspecified mononeuropathy of bilateral lower limbs: Secondary | ICD-10-CM

## 2016-05-04 DIAGNOSIS — I10 Essential (primary) hypertension: Secondary | ICD-10-CM

## 2016-05-04 DIAGNOSIS — E039 Hypothyroidism, unspecified: Secondary | ICD-10-CM

## 2016-05-04 MED ORDER — GABAPENTIN 100 MG PO CAPS: 100.0000 mg | ORAL_CAPSULE | Freq: Three times a day (TID) | ORAL | 3 refills | Status: DC

## 2016-05-04 NOTE — Patient Instructions (Signed)
Take 1 capsule at bedtime for 1 week, then 2 capsule at bedtime for 2 weeks, then 1 capsule in the morning and 2 capsules at bedtime.

## 2016-05-04 NOTE — Progress Notes (Signed)
Patient: Rebecca Patton Female    DOB: 1946/07/31   70 y.o.   MRN: [id number] Visit Date: 05/04/2016  Today's Provider: [name], MD   Chief Complaint  Patient presents with  . Hypertension    6 month follow up   . Hyperlipidemia  . Hypothyroidism   Subjective:    HPI  Hypertension, follow-up:  BP Readings from Last 3 Encounters:  05/04/16 128/60  11/10/15 (!) 160/78  11/05/15 130/72    She was last seen for hypertension 6 months ago.  BP at that visit was 160/78. Management since that visit includes no changes. She reports good compliance with treatment. She is not having side effects.  She is not exercising. She is adherent to low salt diet.   Outside blood pressures are checked occasionally. Patient reports that it is usually around 120s-130s/60s-70s. Patient denies chest pain, dyspnea, irregular heart beat and lower extremity edema.   Cardiovascular risk factors include dyslipidemia. .     Weight trend: stable Wt Readings from Last 3 Encounters:  05/04/16 195 lb (88.5 kg)  11/10/15 206 lb 3.2 oz (93.5 kg)  11/05/15 205 lb (93 kg)    Current diet: well balanced     Lipid/Cholesterol, Follow-up:   Last seen for this6 months ago.  Management changes since that visit include no changes. . Last Lipid Panel:    Component Value Date/Time   CHOL 217 (H) 11/06/2015 0810   TRIG 164 (H) 11/06/2015 0810   HDL 58 11/06/2015 0810   LDLCALC 126 (H) 11/06/2015 0810    She reports good compliance with treatment. She is not having side effects.  Current symptoms include none and have been stable. Weight trend: stable Prior visit with dietician: no Current diet: well balanced Current exercise: none  Wt Readings from Last 3 Encounters:  05/04/16 195 lb (88.5 kg)  11/10/15 206 lb 3.2 oz (93.5 kg)  11/05/15 205 lb (93 kg)     Hypothyroidism: Patient is also here to follow up on her thyroid levels. Patient denies any hair loss or excessive  fatigue. Denies abnormal weight gain, or changes in appetite.     No Known Allergies Current Meds  Medication Sig  . amLODipine (NORVASC) 5 MG tablet Take by mouth. Reported on 10/06/2015  . aspirin 81 MG tablet Take by mouth.  . hydrochlorothiazide (HYDRODIURIL) 25 MG tablet Take 1 tablet (25 mg total) by mouth daily.  Marland Kitchen levothyroxine (SYNTHROID) 50 MCG tablet Take 1 tablet (50 mcg total) by mouth daily before breakfast.  . MULTIPLE VITAMIN PO Take by mouth.  . OMEGA-3 FATTY ACIDS PO Take by mouth.  . Pyridoxine HCl (VITAMIN B6) 250 MG TABS Take by mouth daily.   . quinapril (ACCUPRIL) 40 MG tablet Take 1 tablet (40 mg total) by mouth daily.  . [DISCONTINUED] meloxicam (MOBIC) 15 MG tablet Take by mouth. Reported on 10/06/2015    Review of Systems  Constitutional: Negative.   Respiratory: Negative.   Cardiovascular: Negative.   Endocrine: Negative.   Musculoskeletal: Negative.   Skin: Negative.   Neurological: Negative.   Psychiatric/Behavioral: Negative.     Social History  Substance Use Topics  . Smoking status: Former Smoker    Quit date: 09/11/1997  . Smokeless tobacco: Not on file  . Alcohol use No   Objective:   BP 128/60   Pulse 76   Temp 98.6 F (37 C)   Resp 16   Wt 195 lb (88.5 kg)  BMI 31.47 kg/m   Physical Exam  Constitutional: She is oriented to person, place, and time. She appears well-developed and well-nourished.  HENT:  Head: Normocephalic and atraumatic.  Right Ear: External ear normal.  Left Ear: External ear normal.  Nose: Nose normal.  Eyes: Conjunctivae are normal. No scleral icterus.  Neck: Neck supple. No thyromegaly present.  Cardiovascular: Normal rate, regular rhythm and normal heart sounds.   Pulmonary/Chest: Effort normal and breath sounds normal.  Abdominal: Soft.  Lymphadenopathy:    She has no cervical adenopathy.  Neurological: She is alert and oriented to person, place, and time.  Skin: Skin is warm and dry.  Psychiatric:  She has a normal mood and affect. Her behavior is normal. Judgment and thought content normal.        Assessment & Plan:    1. Essential (primary) hypertension   2. Hypothyroidism, unspecified hypothyroidism type   3. HLD (hyperlipidemia)   4. Neuropathy involving both lower extremities (HCC)  -gabapentin (NEURONTIN) 100 MG capsule. Take 1 capsule (100mg  total) by mouth 3 (three) times daily.   May need neurology referral. Presently idiopathic. Check A1c and B12 level.       Paetyn Pietrzak Cranford Mon, MD  [facility]

## 2016-05-05 NOTE — Addendum Note (Signed)
Addended by: Luvenia Heller on: 05/05/2016 09:09 AM   Modules accepted: Orders

## 2016-05-12 LAB — HEMOGLOBIN A1C
Est. average glucose Bld gHb Est-mCnc: 114 mg/dL
Hgb A1c MFr Bld: 5.6 % (ref 4.8–5.6)

## 2016-05-12 LAB — VITAMIN B12: Vitamin B-12: 635 pg/mL (ref 211–946)

## 2016-08-09 ENCOUNTER — Telehealth: Payer: Self-pay | Admitting: Family Medicine

## 2016-08-09 NOTE — Telephone Encounter (Signed)
Patient states that she wanted to make sure its okay to stop medication "cold [place]" or if she should be weaned off this medication? Please advise. KW

## 2016-08-09 NOTE — Telephone Encounter (Signed)
Please review-aa 

## 2016-08-09 NOTE — Telephone Encounter (Signed)
Every other day for 1 week then stop.

## 2016-08-09 NOTE — Telephone Encounter (Signed)
Pt stated that she thinks she is having side effects of the gabapentin (NEURONTIN) 100 MG capsule. Pt stated for the last month she has had stomach pain, nausea, dizzy, & fatigue. Pt believes it is due to the medication and would like to know if she can stop all together or if she needs to lower the dosage and slowly stop taking the medication. Pt declined the OV that was offered and stated that she is fairly certain the medication is causing the symptoms and she wants to know who to stop the medication. Pt stated then if the symptoms continue she will come in for OV. Please advise. Thanks TNP

## 2016-08-09 NOTE — Telephone Encounter (Signed)
Patient has been advised. KW 

## 2016-08-09 NOTE — Telephone Encounter (Signed)
Ok to stop

## 2016-10-03 ENCOUNTER — Encounter: Payer: Self-pay | Admitting: Obstetrics and Gynecology

## 2016-10-05 ENCOUNTER — Ambulatory Visit
Admission: RE | Admit: 2016-10-05 | Discharge: 2016-10-05 | Disposition: A | Discharge status: Home / Self Care | Payer: Medicare HMO | Source: Ambulatory Visit | Attending: Obstetrics and Gynecology | Admitting: Obstetrics and Gynecology

## 2016-10-05 DIAGNOSIS — Z1231 Encounter for screening mammogram for malignant neoplasm of breast: Secondary | ICD-10-CM | POA: Insufficient documentation

## 2016-10-05 DIAGNOSIS — Z01419 Encounter for gynecological examination (general) (routine) without abnormal findings: Secondary | ICD-10-CM

## 2016-10-07 ENCOUNTER — Encounter: Payer: Self-pay | Admitting: Obstetrics and Gynecology

## 2016-10-07 ENCOUNTER — Ambulatory Visit (INDEPENDENT_AMBULATORY_CARE_PROVIDER_SITE_OTHER): Payer: Medicare HMO | Admitting: Obstetrics and Gynecology

## 2016-10-07 VITALS — BP 139/61 | HR 71 | Ht 66.0 in | Wt 196.2 lb

## 2016-10-07 DIAGNOSIS — Z01419 Encounter for gynecological examination (general) (routine) without abnormal findings: Secondary | ICD-10-CM | POA: Diagnosis not present

## 2016-10-07 NOTE — Progress Notes (Signed)
Subjective:   Rebecca Patton is a 71 y.o. G41P2 Caucasian female here for a routine well-woman exam.  No LMP recorded. Patient has had a hysterectomy.    Current complaints: No new complaints.  PCP: Dr. Eulas Post      Does not need labs.   Social History:  Sexual: heterosexual Marital Status: married Living situation: with spouse Occupation: Research scientist (physical sciences) Tobacco/alcohol: no tobacco use Illicit drugs: no history of illicit drug use  The following portions of the patient's history were reviewed and updated as appropriate: allergies, current medications, past family history, past medical history, past social history, past surgical history and problem list.  Past Medical History Past Medical History:  Diagnosis Date  . Arthritis   . High blood pressure   . Uterine cancer [facility])     Past Surgical History Past Surgical History:  Procedure Laterality Date  . ABDOMINAL HYSTERECTOMY  2009    BSO  . BUNIONECTOMY    . EXCISION NEUROMA    . NASAL SINUS SURGERY    . TUBAL LIGATION      Gynecologic History G2P2  No LMP recorded. Patient has had a hysterectomy. Contraception: post menopausal status Last Pap: 2015. Results were: normal Last mammogram: October 05, 2016. Results were: normal   Obstetric History OB History  Gravida Para Term Preterm AB Living  2 2          SAB TAB Ectopic Multiple Live Births               # Outcome Date GA Lbr Len/2nd Weight Sex Delivery Anes PTL Lv  2 Para 1972    F Vag-Spont     1 Para 1969    F Vag-Spont         Current Medications Current Outpatient Prescriptions on File Prior to Visit  Medication Sig Dispense Refill  . aspirin 81 MG tablet Take by mouth.    . hydrochlorothiazide (HYDRODIURIL) 25 MG tablet Take 1 tablet (25 mg total) by mouth daily. 90 tablet 3  . levothyroxine (SYNTHROID) 50 MCG tablet Take 1 tablet (50 mcg total) by mouth daily before breakfast. 90 tablet 3  . MULTIPLE VITAMIN PO Take by mouth.    . OMEGA-3  FATTY ACIDS PO Take by mouth.    . Pyridoxine HCl (VITAMIN B6) 250 MG TABS Take by mouth daily.     . quinapril (ACCUPRIL) 40 MG tablet Take 1 tablet (40 mg total) by mouth daily. 90 tablet 3   No current facility-administered medications on file prior to visit.     Review of Systems Patient denies any headaches, blurred vision, shortness of breath, chest pain, abdominal pain, problems with bowel movements, urination, or intercourse.  Objective:  BP 139/61   Pulse 71   Ht 5\' 6"  (1.676 m)   Wt 196 lb 3.2 oz (89 kg)   BMI 31.67 kg/m  Physical Exam  General:  Well developed, well nourished, no acute distress. She is alert and oriented x3. Skin:  Warm and dry Neck:  Midline trachea, no thyromegaly or nodules Cardiovascular: Regular rate and rhythm, no murmur heard Lungs:  Effort normal, all lung fields clear to auscultation bilaterally Breasts:  Not examined. Abdomen:  Not examined. Pelvic:  External genitalia is normal in appearance.  The vagina is atrophic and shortened (no change from last years exam). The cervical cuff is atrophic.  Thin prep pap is not done . Uterus is surgically absentr.  No adnexal masses or tenderness noted. Extremities:  No swelling or varicosities noted Psych:  She has a normal mood and affect  Assessment:   Healthy well-woman exam Pelvic completed. Hysterectomy.   Plan:  No labs needed F/U 1 year for pelvic exam, or sooner if needed Mammogram 1 year or sooner if problems  Kamerin Axford Rockney Ghee, CNM

## 2016-10-18 DIAGNOSIS — D492 Neoplasm of unspecified behavior of bone, soft tissue, and skin: Secondary | ICD-10-CM | POA: Diagnosis not present

## 2016-10-18 DIAGNOSIS — L570 Actinic keratosis: Secondary | ICD-10-CM | POA: Diagnosis not present

## 2016-10-18 DIAGNOSIS — L821 Other seborrheic keratosis: Secondary | ICD-10-CM | POA: Diagnosis not present

## 2016-10-18 DIAGNOSIS — Z859 Personal history of malignant neoplasm, unspecified: Secondary | ICD-10-CM | POA: Diagnosis not present

## 2016-10-27 ENCOUNTER — Ambulatory Visit
Admission: RE | Admit: 2016-10-27 | Discharge: 2016-10-27 | Disposition: A | Discharge status: Home / Self Care | Payer: Medicare HMO | Source: Ambulatory Visit | Attending: Physician Assistant | Admitting: Physician Assistant

## 2016-10-27 ENCOUNTER — Telehealth: Payer: Self-pay

## 2016-10-27 ENCOUNTER — Ambulatory Visit (INDEPENDENT_AMBULATORY_CARE_PROVIDER_SITE_OTHER): Payer: Medicare HMO | Admitting: Physician Assistant

## 2016-10-27 VITALS — BP 132/72 | HR 66 | Temp 99.1°F | Resp 16 | Wt 195.0 lb

## 2016-10-27 DIAGNOSIS — J069 Acute upper respiratory infection, unspecified: Secondary | ICD-10-CM

## 2016-10-27 DIAGNOSIS — I700 Atherosclerosis of aorta: Secondary | ICD-10-CM | POA: Insufficient documentation

## 2016-10-27 DIAGNOSIS — B9789 Other viral agents as the cause of diseases classified elsewhere: Principal | ICD-10-CM

## 2016-10-27 DIAGNOSIS — R05 Cough: Secondary | ICD-10-CM | POA: Diagnosis not present

## 2016-10-27 LAB — POCT INFLUENZA A/B
Influenza A, POC: NEGATIVE
Influenza B, POC: NEGATIVE

## 2016-10-27 MED ORDER — HYDROCODONE-HOMATROPINE 5-1.5 MG/5ML PO SYRP: 5.0000 mL | ORAL_SOLUTION | Freq: Every evening | ORAL | 0 refills | Status: DC | PRN

## 2016-10-27 MED ORDER — DOXYCYCLINE HYCLATE 100 MG PO TABS: 100.0000 mg | ORAL_TABLET | Freq: Two times a day (BID) | ORAL | 0 refills | Status: AC

## 2016-10-27 NOTE — Telephone Encounter (Signed)
-----   Message from Trinna Post, [place] sent at 10/27/2016  3:38 PM EST ----- CXR normal.

## 2016-10-27 NOTE — Patient Instructions (Signed)
Upper Respiratory Infection, Adult Most upper respiratory infections (URIs) are caused by a virus. A URI affects the nose, throat, and upper air passages. The most common type of URI is often called "the common cold." Follow these instructions at home:  Take medicines only as told by your doctor.  Gargle warm saltwater or take cough drops to comfort your throat as told by your doctor.  Use a warm mist humidifier or inhale steam from a shower to increase air moisture. This may make it easier to breathe.  Drink enough fluid to keep your pee (urine) clear or pale yellow.  Eat soups and other clear broths.  Have a healthy diet.  Rest as needed.  Go back to work when your fever is gone or your doctor says it is okay.  You may need to stay home longer to avoid giving your URI to others.  You can also wear a face mask and wash your hands often to prevent spread of the virus.  Use your inhaler more if you have asthma.  Do not use any tobacco products, including cigarettes, chewing tobacco, or electronic cigarettes. If you need help quitting, ask your doctor. Contact a doctor if:  You are getting worse, not better.  Your symptoms are not helped by medicine.  You have chills.  You are getting more short of breath.  You have brown or red mucus.  You have yellow or brown discharge from your nose.  You have pain in your face, especially when you bend forward.  You have a fever.  You have puffy (swollen) neck glands.  You have pain while swallowing.  You have white areas in the back of your throat. Get help right away if:  You have very bad or constant:  Headache.  Ear pain.  Pain in your forehead, behind your eyes, and over your cheekbones (sinus pain).  Chest pain.  You have long-lasting (chronic) lung disease and any of the following:  Wheezing.  Long-lasting cough.  Coughing up blood.  A change in your usual mucus.  You have a stiff neck.  You have  changes in your:  Vision.  Hearing.  Thinking.  Mood. This information is not intended to replace advice given to you by your health care provider. Make sure you discuss any questions you have with your health care provider. Document Released: 02/15/2008 Document Revised: 05/01/2016 Document Reviewed: 12/04/2013 Elsevier Interactive Patient Education  2017 Elsevier Inc.  

## 2016-10-27 NOTE — Telephone Encounter (Signed)
Patient has been advised. KW 

## 2016-10-27 NOTE — Addendum Note (Signed)
Addended by: Trinna Post on: 10/27/2016 02:10 PM   Modules accepted: Orders

## 2016-10-27 NOTE — Progress Notes (Signed)
Patient has been advised. KW 

## 2016-10-27 NOTE — Progress Notes (Signed)
Patient: Rebecca Patton Female    DOB: [birth year]   71 y.o.   MRN: [id number] Visit Date: 10/27/2016  Today's Provider: Trinna Post, PA-C   Chief Complaint  Patient presents with  . URI   Subjective:    URI   The current episode started in the past 7 days (about 3 days ago). The problem has been gradually worsening. There has been no fever (has felt feverish). Associated symptoms include congestion, coughing, headaches, rhinorrhea, sinus pain, sneezing and a sore throat. She has tried decongestant for the symptoms. The treatment provided mild relief.   Patient is a 71 y/o woman with hx of sinus surgery in 2000- c/o above sx. Says she had walking pneumonia last year and it felt like this. Is taking care of her grand kids next week and wants to "nip this in the bud." Some nausea, no vomiting. No diarrhea.     No Known Allergies   Current Outpatient Prescriptions:  .  aspirin 81 MG tablet, Take by mouth., Disp: , Rfl:  .  hydrochlorothiazide (HYDRODIURIL) 25 MG tablet, Take 1 tablet (25 mg total) by mouth daily., Disp: 90 tablet, Rfl: 3 .  levothyroxine (SYNTHROID) 50 MCG tablet, Take 1 tablet (50 mcg total) by mouth daily before breakfast., Disp: 90 tablet, Rfl: 3 .  MULTIPLE VITAMIN PO, Take by mouth., Disp: , Rfl:  .  OMEGA-3 FATTY ACIDS PO, Take by mouth., Disp: , Rfl:  .  Pyridoxine HCl (VITAMIN B6) 250 MG TABS, Take by mouth daily. , Disp: , Rfl:  .  quinapril (ACCUPRIL) 40 MG tablet, Take 1 tablet (40 mg total) by mouth daily., Disp: 90 tablet, Rfl: 3 .  doxycycline (VIBRA-TABS) 100 MG tablet, Take 1 tablet (100 mg total) by mouth 2 (two) times daily., Disp: 14 tablet, Rfl: 0 .  HYDROcodone-homatropine (HYCODAN) 5-1.5 MG/5ML syrup, Take 5 mLs by mouth at bedtime as needed for cough., Disp: 120 mL, Rfl: 0  Review of Systems  HENT: Positive for congestion, rhinorrhea, sinus pain, sneezing and sore throat.   Respiratory: Positive for cough.   Neurological: Positive for  headaches.    Social History  Substance Use Topics  . Smoking status: Former Smoker    Quit date: 09/11/1997  . Smokeless tobacco: Never Used  . Alcohol use No   Objective:   BP 132/72 (BP Location: Left Arm, Patient Position: Sitting, Cuff Size: Normal)   Pulse 66   Temp 99.1 F (37.3 C)   Resp 16   Wt 195 lb (88.5 kg)   SpO2 96%   BMI 31.47 kg/m   Physical Exam  Constitutional: She appears well-developed and well-nourished.  HENT:  Right Ear: External ear normal.  Left Ear: External ear normal.  Nose: Rhinorrhea present.  Mouth/Throat: Oropharynx is clear and moist. No oropharyngeal exudate.  Eyes: Right eye exhibits discharge. Left eye exhibits discharge.  Neck: Neck supple.  Cardiovascular: Normal rate and regular rhythm.   Pulmonary/Chest: Effort normal and breath sounds normal. No respiratory distress. She has no rales.  Lymphadenopathy:    She has no cervical adenopathy.  Skin: Skin is warm and dry.  Psychiatric: She has a normal mood and affect. Her behavior is normal.        Assessment & Plan:     1. Viral URI with cough  Will eval as below. Counseled on sedation of cough syrup. If CXR is negative, would like pt to hold off on filling doxycycline unless she  gets worse.  - HYDROcodone-homatropine (HYCODAN) 5-1.5 MG/5ML syrup; Take 5 mLs by mouth at bedtime as needed for cough.  Dispense: 120 mL; Refill: 0 - doxycycline (VIBRA-TABS) 100 MG tablet; Take 1 tablet (100 mg total) by mouth 2 (two) times daily.  Dispense: 14 tablet; Refill: 0 - DG Chest 2 View; Future  Return if symptoms worsen or fail to improve.  Patient Instructions  Upper Respiratory Infection, Adult Most upper respiratory infections (URIs) are caused by a virus. A URI affects the nose, throat, and upper air passages. The most common type of URI is often called "the common cold." Follow these instructions at home:  Take medicines only as told by your doctor.  Gargle warm saltwater or  take cough drops to comfort your throat as told by your doctor.  Use a warm mist humidifier or inhale steam from a shower to increase air moisture. This may make it easier to breathe.  Drink enough fluid to keep your pee (urine) clear or pale yellow.  Eat soups and other clear broths.  Have a healthy diet.  Rest as needed.  Go back to work when your fever is gone or your doctor says it is okay.  You may need to stay home longer to avoid giving your URI to others.  You can also wear a face mask and wash your hands often to prevent spread of the virus.  Use your inhaler more if you have asthma.  Do not use any tobacco products, including cigarettes, chewing tobacco, or electronic cigarettes. If you need help quitting, ask your doctor. Contact a doctor if:  You are getting worse, not better.  Your symptoms are not helped by medicine.  You have chills.  You are getting more short of breath.  You have brown or red mucus.  You have yellow or brown discharge from your nose.  You have pain in your face, especially when you bend forward.  You have a fever.  You have puffy (swollen) neck glands.  You have pain while swallowing.  You have white areas in the back of your throat. Get help right away if:  You have very bad or constant:  Headache.  Ear pain.  Pain in your forehead, behind your eyes, and over your cheekbones (sinus pain).  Chest pain.  You have long-lasting (chronic) lung disease and any of the following:  Wheezing.  Long-lasting cough.  Coughing up blood.  A change in your usual mucus.  You have a stiff neck.  You have changes in your:  Vision.  Hearing.  Thinking.  Mood. This information is not intended to replace advice given to you by your health care provider. Make sure you discuss any questions you have with your health care provider. Document Released: 02/15/2008 Document Revised: 05/01/2016 Document Reviewed: 12/04/2013 Elsevier  Interactive Patient Education  2017 [employer].    The entirety of the information documented in the History of Present Illness, Review of Systems and Physical Exam were personally obtained by me. Portions of this information were initially documented by Ashley Royalty, CMA and reviewed by me for thoroughness and accuracy.        Trinna Post, PA-C  [facility]

## 2016-11-07 ENCOUNTER — Ambulatory Visit: Payer: Medicare Other | Admitting: Family Medicine

## 2016-12-06 ENCOUNTER — Telehealth: Payer: Self-pay | Admitting: Family Medicine

## 2016-12-06 DIAGNOSIS — I10 Essential (primary) hypertension: Secondary | ICD-10-CM

## 2016-12-06 MED ORDER — LEVOTHYROXINE SODIUM 50 MCG PO TABS: 50.0000 ug | ORAL_TABLET | Freq: Every day | ORAL | 3 refills | Status: DC

## 2016-12-06 MED ORDER — QUINAPRIL HCL 40 MG PO TABS: 40.0000 mg | ORAL_TABLET | Freq: Every day | ORAL | 3 refills | Status: DC

## 2016-12-06 MED ORDER — HYDROCHLOROTHIAZIDE 25 MG PO TABS: 25.0000 mg | ORAL_TABLET | Freq: Every day | ORAL | 3 refills | Status: DC

## 2016-12-06 NOTE — Telephone Encounter (Signed)
Pt contacted office for refill request on the following medications: 1. hydrochlorothiazide (HYDRODIURIL) 25 MG tablet  2. levothyroxine (SYNTHROID) 50 MCG tablet 3. quinapril (ACCUPRIL) 40 MG tablet 90 day supply  [place] [phone] Pt stated that her insurance changed and she has to use the mail order. Pt stated that she had advised [place] of this in January but the new Rx haven't been sent it. Pt stated that she is about to run out of the medications and would like them sent in today if possible. Please advise. Thanks TNP

## 2016-12-06 NOTE — Telephone Encounter (Signed)
Ok to send in medication?   

## 2016-12-06 NOTE — Telephone Encounter (Signed)
6 months thx

## 2017-02-13 DIAGNOSIS — R69 Illness, unspecified: Secondary | ICD-10-CM | POA: Diagnosis not present

## 2017-06-20 DIAGNOSIS — R69 Illness, unspecified: Secondary | ICD-10-CM | POA: Diagnosis not present

## 2017-07-04 DIAGNOSIS — H40019 Open angle with borderline findings, low risk, unspecified eye: Secondary | ICD-10-CM | POA: Diagnosis not present

## 2017-07-04 DIAGNOSIS — H401130 Primary open-angle glaucoma, bilateral, stage unspecified: Secondary | ICD-10-CM | POA: Diagnosis not present

## 2017-07-04 DIAGNOSIS — H2513 Age-related nuclear cataract, bilateral: Secondary | ICD-10-CM | POA: Diagnosis not present

## 2017-08-17 DIAGNOSIS — R69 Illness, unspecified: Secondary | ICD-10-CM | POA: Diagnosis not present

## 2017-10-10 ENCOUNTER — Encounter: Payer: Medicare HMO | Admitting: Obstetrics and Gynecology

## 2017-10-18 DIAGNOSIS — Z872 Personal history of diseases of the skin and subcutaneous tissue: Secondary | ICD-10-CM | POA: Diagnosis not present

## 2017-10-18 DIAGNOSIS — L821 Other seborrheic keratosis: Secondary | ICD-10-CM | POA: Diagnosis not present

## 2017-10-18 DIAGNOSIS — L918 Other hypertrophic disorders of the skin: Secondary | ICD-10-CM | POA: Diagnosis not present

## 2017-10-18 DIAGNOSIS — D1800 Hemangioma unspecified site: Secondary | ICD-10-CM | POA: Diagnosis not present

## 2017-10-18 DIAGNOSIS — L578 Other skin changes due to chronic exposure to nonionizing radiation: Secondary | ICD-10-CM | POA: Diagnosis not present

## 2017-10-18 DIAGNOSIS — Z859 Personal history of malignant neoplasm, unspecified: Secondary | ICD-10-CM | POA: Diagnosis not present

## 2017-11-01 ENCOUNTER — Ambulatory Visit (INDEPENDENT_AMBULATORY_CARE_PROVIDER_SITE_OTHER): Payer: Medicare HMO

## 2017-11-01 VITALS — BP 144/64 | HR 72 | Temp 98.5°F | Ht 66.0 in | Wt 205.8 lb

## 2017-11-01 DIAGNOSIS — Z0000 Encounter for general adult medical examination without abnormal findings: Secondary | ICD-10-CM
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
## 2017-11-01 NOTE — Patient Instructions (Addendum)
Rebecca Patton , Thank you for taking time to come for your Medicare Wellness Visit. I appreciate your ongoing commitment to your health goals. Please review the following plan we discussed and let me know if I can assist you in the future.   Screening recommendations/referrals: Colonoscopy: Up to date Mammogram: Up to date Bone Density: Up to date Recommended yearly ophthalmology/optometry visit for glaucoma screening and checkup Recommended yearly dental visit for hygiene and checkup  Vaccinations: Influenza vaccine: Up to date Pneumococcal vaccine: Up to date Tdap vaccine: Pt declines today.  Shingles vaccine: Pt declines today.     Advanced directives: Please bring a copy of your POA (Power of Attorney) and/or Living Will to your next appointment.   Conditions/risks identified: Obesity- recommend exercising 3 days a week for at least 30 minutes. Pt to start swimming in Spring.   Next appointment: 01/04/18 @ 3:00 PM   Preventive Care 70 Years and Older, Female Preventive care refers to lifestyle choices and visits with your health care provider that can promote health and wellness. What does preventive care include?  A yearly physical exam. This is also called an annual well check.  Dental exams once or twice a year.  Routine eye exams. Ask your health care provider how often you should have your eyes checked.  Personal lifestyle choices, including:  Daily care of your teeth and gums.  Regular physical activity.  Eating a healthy diet.  Avoiding tobacco and drug use.  Limiting alcohol use.  Practicing safe sex.  Taking low-dose aspirin every day.  Taking vitamin and mineral supplements as recommended by your health care provider. What happens during an annual well check? The services and screenings done by your health care provider during your annual well check will depend on your age, overall health, lifestyle risk factors, and family history of disease. Counseling   Your health care provider may ask you questions about your:  Alcohol use.  Tobacco use.  Drug use.  Emotional well-being.  Home and relationship well-being.  Sexual activity.  Eating habits.  History of falls.  Memory and ability to understand (cognition).  Work and work Statistician.  Reproductive health. Screening  You may have the following tests or measurements:  Height, weight, and BMI.  Blood pressure.  Lipid and cholesterol levels. These may be checked every 5 years, or more frequently if you are over 53 years old.  Skin check.  Lung cancer screening. You may have this screening every year starting at age 60 if you have a 30-pack-year history of smoking and currently smoke or have quit within the past 15 years.  Fecal occult blood test (FOBT) of the stool. You may have this test every year starting at age 72.  Flexible sigmoidoscopy or colonoscopy. You may have a sigmoidoscopy every 5 years or a colonoscopy every 10 years starting at age 76.  Hepatitis C blood test.  Hepatitis B blood test.  Sexually transmitted disease (STD) testing.  Diabetes screening. This is done by checking your blood sugar (glucose) after you have not eaten for a while (fasting). You may have this done every 1-3 years.  Bone density scan. This is done to screen for osteoporosis. You may have this done starting at age 71.  Mammogram. This may be done every 1-2 years. Talk to your health care provider about how often you should have regular mammograms. Talk with your health care provider about your test results, treatment options, and if necessary, the need for more tests. Vaccines  Your health care provider may recommend certain vaccines, such as:  Influenza vaccine. This is recommended every year.  Tetanus, diphtheria, and acellular pertussis (Tdap, Td) vaccine. You may need a Td booster every 10 years.  Zoster vaccine. You may need this after age 17.  Pneumococcal 13-valent  conjugate (PCV13) vaccine. One dose is recommended after age 14.  Pneumococcal polysaccharide (PPSV23) vaccine. One dose is recommended after age 55. Talk to your health care provider about which screenings and vaccines you need and how often you need them. This information is not intended to replace advice given to you by your health care provider. Make sure you discuss any questions you have with your health care provider. Document Released: 09/25/2015 Document Revised: 05/18/2016 Document Reviewed: 06/30/2015 Elsevier Interactive Patient Education  2017 [place] Prevention in the Home Falls can cause injuries. They can happen to people of all ages. There are many things you can do to make your home safe and to help prevent falls. What can I do on the outside of my home?  Regularly fix the edges of walkways and driveways and fix any cracks.  Remove anything that might make you trip as you walk through a door, such as a raised step or threshold.  Trim any bushes or trees on the path to your home.  Use bright outdoor lighting.  Clear any walking paths of anything that might make someone trip, such as rocks or tools.  Regularly check to see if handrails are loose or broken. Make sure that both sides of any steps have handrails.  Any raised decks and porches should have guardrails on the edges.  Have any leaves, snow, or ice cleared regularly.  Use sand or salt on walking paths during winter.  Clean up any spills in your garage right away. This includes oil or grease spills. What can I do in the bathroom?  Use night lights.  Install grab bars by the toilet and in the tub and shower. Do not use towel bars as grab bars.  Use non-skid mats or decals in the tub or shower.  If you need to sit down in the shower, use a plastic, non-slip stool.  Keep the floor dry. Clean up any water that spills on the floor as soon as it happens.  Remove soap buildup in the tub or  shower regularly.  Attach bath mats securely with double-sided non-slip rug tape.  Do not have throw rugs and other things on the floor that can make you trip. What can I do in the bedroom?  Use night lights.  Make sure that you have a light by your bed that is easy to reach.  Do not use any sheets or blankets that are too big for your bed. They should not hang down onto the floor.  Have a firm chair that has side arms. You can use this for support while you get dressed.  Do not have throw rugs and other things on the floor that can make you trip. What can I do in the kitchen?  Clean up any spills right away.  Avoid walking on wet floors.  Keep items that you use a lot in easy-to-reach places.  If you need to reach something above you, use a strong step stool that has a grab bar.  Keep electrical cords out of the way.  Do not use floor polish or wax that makes floors slippery. If you must use wax, use non-skid floor wax.  Do  not have throw rugs and other things on the floor that can make you trip. What can I do with my stairs?  Do not leave any items on the stairs.  Make sure that there are handrails on both sides of the stairs and use them. Fix handrails that are broken or loose. Make sure that handrails are as long as the stairways.  Check any carpeting to make sure that it is firmly attached to the stairs. Fix any carpet that is loose or worn.  Avoid having throw rugs at the top or bottom of the stairs. If you do have throw rugs, attach them to the floor with carpet tape.  Make sure that you have a light switch at the top of the stairs and the bottom of the stairs. If you do not have them, ask someone to add them for you. What else can I do to help prevent falls?  Wear shoes that:  Do not have high heels.  Have rubber bottoms.  Are comfortable and fit you well.  Are closed at the toe. Do not wear sandals.  If you use a stepladder:  Make sure that it is fully  opened. Do not climb a closed stepladder.  Make sure that both sides of the stepladder are locked into place.  Ask someone to hold it for you, if possible.  Clearly mark and make sure that you can see:  Any grab bars or handrails.  First and last steps.  Where the edge of each step is.  Use tools that help you move around (mobility aids) if they are needed. These include:  Canes.  Walkers.  Scooters.  Crutches.  Turn on the lights when you go into a dark area. Replace any light bulbs as soon as they burn out.  Set up your furniture so you have a clear path. Avoid moving your furniture around.  If any of your floors are uneven, fix them.  If there are any pets around you, be aware of where they are.  Review your medicines with your doctor. Some medicines can make you feel dizzy. This can increase your chance of falling. Ask your doctor what other things that you can do to help prevent falls. This information is not intended to replace advice given to you by your health care provider. Make sure you discuss any questions you have with your health care provider. Document Released: 06/25/2009 Document Revised: 02/04/2016 Document Reviewed: 10/03/2014 Elsevier Interactive Patient Education  2017 [employer].

## 2017-11-07 ENCOUNTER — Ambulatory Visit (INDEPENDENT_AMBULATORY_CARE_PROVIDER_SITE_OTHER): Payer: Medicare HMO | Admitting: Family Medicine

## 2017-11-07 VITALS — BP 148/72 | HR 78 | Temp 97.8°F | Resp 16 | Wt 205.0 lb

## 2017-11-07 DIAGNOSIS — E7800 Pure hypercholesterolemia, unspecified: Secondary | ICD-10-CM | POA: Diagnosis not present

## 2017-11-07 DIAGNOSIS — R0602 Shortness of breath: Secondary | ICD-10-CM | POA: Diagnosis not present

## 2017-11-07 DIAGNOSIS — R42 Dizziness and giddiness: Secondary | ICD-10-CM

## 2017-11-07 DIAGNOSIS — Z1159 Encounter for screening for other viral diseases: Secondary | ICD-10-CM | POA: Diagnosis not present

## 2017-11-07 DIAGNOSIS — I10 Essential (primary) hypertension: Secondary | ICD-10-CM | POA: Diagnosis not present

## 2017-11-07 MED ORDER — METOPROLOL SUCCINATE ER 25 MG PO TB24
25.0000 mg | ORAL_TABLET | Freq: Every day | ORAL | 11 refills | Status: DC
Start: 2017-11-07 — End: 2017-11-07

## 2017-11-07 MED ORDER — METOPROLOL SUCCINATE ER 25 MG PO TB24: 25.0000 mg | ORAL_TABLET | Freq: Every day | ORAL | 11 refills | Status: DC

## 2017-11-07 NOTE — Progress Notes (Signed)
Patient: Rebecca Patton Female    DOB: [birth year]   72 y.o.   MRN: [id number] Visit Date: 11/07/2017  Today's Provider: [name], MD   Chief Complaint  Patient presents with  . Dizziness  . Shortness of Breath   Subjective:    HPI Pt is here today because she has not been feeling well. She woke up about 2 weeks ago dizzy and thought she had vertigo. She reports that has not gotten any better, it is made worse when she turns her head fast. She has been checking her blood pressure and it has been running higher 130-150/160-70 and has been as high as 154/100. She reports that she just feels tired all the time, is short of breath, yesterday her right arm felt like it was going numb. She reports episodes of feeling shaky. Pt has also had a slight headache. Denies any chest pain. She also reports that she has decreased hearing in both ears but is worse in her right ear. She has known episodes of tachycardia and she reports that she has felt like she was about to have an episode of that a couple of times but never had the episode.       No Known Allergies   Current Outpatient Medications:  .  aspirin 81 MG tablet, Take 81 mg by mouth. , Disp: , Rfl:  .  hydrochlorothiazide (HYDRODIURIL) 25 MG tablet, Take 1 tablet (25 mg total) by mouth daily., Disp: 90 tablet, Rfl: 3 .  levothyroxine (SYNTHROID) 50 MCG tablet, Take 1 tablet (50 mcg total) by mouth daily before breakfast., Disp: 90 tablet, Rfl: 3 .  MULTIPLE VITAMIN PO, Take by mouth., Disp: , Rfl:  .  OMEGA-3 FATTY ACIDS PO, Take 1,040 mg by mouth daily. , Disp: , Rfl:  .  quinapril (ACCUPRIL) 40 MG tablet, Take 1 tablet (40 mg total) by mouth daily., Disp: 90 tablet, Rfl: 3 .  vitamin B-12 (CYANOCOBALAMIN) 100 MCG tablet, Take 200 mcg by mouth daily., Disp: , Rfl:   Review of Systems  Constitutional: Positive for fatigue.  HENT: Positive for hearing loss.   Eyes: Negative.   Respiratory: Positive for shortness of  breath.   Cardiovascular: Positive for palpitations.  Gastrointestinal: Negative.   Endocrine: Negative.   Genitourinary: Negative.   Musculoskeletal: Positive for back pain.  Skin: Negative.   Allergic/Immunologic: Negative.   Neurological: Positive for dizziness and numbness.  Hematological: Negative.   Psychiatric/Behavioral: Negative.     Social History   Tobacco Use  . Smoking status: Former Smoker    Last attempt to quit: 09/11/1997    Years since quitting: 20.1  . Smokeless tobacco: Never Used  Substance Use Topics  . Alcohol use: No    Alcohol/week: 0.0 oz   Objective:   BP (!) 148/72 (BP Location: Left Arm, Patient Position: Sitting, Cuff Size: Normal)   Pulse 78   Temp 97.8 F (36.6 C) (Oral)   Resp 16   Wt 205 lb (93 kg)   SpO2 98%   BMI 33.09 kg/m  Vitals:   11/07/17 1539  BP: (!) 148/72  Pulse: 78  Resp: 16  Temp: 97.8 F (36.6 C)  TempSrc: Oral  SpO2: 98%  Weight: 205 lb (93 kg)     Physical Exam  Constitutional: She is oriented to person, place, and time. She appears well-developed.  HENT:  Head: Normocephalic and atraumatic.  Right Ear: External ear normal.  Left Ear: External ear  normal.  Nose: Nose normal.  Eyes: Conjunctivae and EOM are normal. Pupils are equal, round, and reactive to light.  Mild nystagmus   Neck: Normal range of motion. Neck supple. No thyromegaly present.  Cardiovascular: Normal rate, regular rhythm, normal heart sounds and intact distal pulses.  Pulmonary/Chest: Effort normal and breath sounds normal.  Abdominal: Soft.  Musculoskeletal: Normal range of motion.  Neurological: She is alert and oriented to person, place, and time. She has normal reflexes.  Skin: Skin is warm and dry.  Psychiatric: She has a normal mood and affect. Her behavior is normal. Judgment and thought content normal.      Assessment & Plan:     1. Dizziness Mild vertigo - EKG 12-Lead  2. Shortness of breath Resolved. - EKG  12-Lead  3. Essential (primary) hypertension  - metoprolol succinate (TOPROL-XL) 25 MG 24 hr tablet; Take 1 tablet (25 mg total) by mouth daily. Take with or immediately following a meal.  Dispense: 30 tablet; Refill: 11 - CBC with Differential/Platelet - TSH  4. Pure hypercholesterolemia  - Lipid panel - Comprehensive metabolic panel  5. Need for hepatitis C screening test  - Hepatitis C Antibody 6.Possible [place]    HPI, Exam, and A&P Transcribed under the direction and in the presence of Richard L. Cranford Mon, MD  Electronically Signed: Katina Dung, CMA  I have done the exam and reviewed the above chart and it is accurate to the best of my knowledge. Development worker, community has been used in this note in any air is in the dictation or transcription are unintentional.  [name], MD  [place]

## 2017-11-07 NOTE — Patient Instructions (Signed)
Push fluids and move slowly for help with the dizziness.

## 2017-11-08 DIAGNOSIS — Z1159 Encounter for screening for other viral diseases: Secondary | ICD-10-CM | POA: Diagnosis not present

## 2017-11-08 DIAGNOSIS — I10 Essential (primary) hypertension: Secondary | ICD-10-CM | POA: Diagnosis not present

## 2017-11-08 DIAGNOSIS — E7800 Pure hypercholesterolemia, unspecified: Secondary | ICD-10-CM | POA: Diagnosis not present

## 2017-11-09 ENCOUNTER — Other Ambulatory Visit: Payer: Self-pay | Admitting: Family Medicine

## 2017-11-09 DIAGNOSIS — Z1231 Encounter for screening mammogram for malignant neoplasm of breast: Secondary | ICD-10-CM

## 2017-11-09 LAB — LIPID PANEL
Chol/HDL Ratio: 3.9 ratio (ref 0.0–4.4)
Cholesterol, Total: 219 mg/dL — ABNORMAL HIGH (ref 100–199)
HDL: 56 mg/dL (ref >39)
LDL Calculated: 126 mg/dL — ABNORMAL HIGH (ref 0–99)
Triglycerides: 183 mg/dL — ABNORMAL HIGH (ref 0–149)
VLDL Cholesterol Cal: 37 mg/dL (ref 5–40)

## 2017-11-09 LAB — CBC WITH DIFFERENTIAL/PLATELET
Basophils Absolute: 0 10*3/uL (ref 0.0–0.2)
Basos: 0 %
EOS (ABSOLUTE): 0.2 10*3/uL (ref 0.0–0.4)
Eos: 3 %
Hematocrit: 44 % (ref 34.0–46.6)
Hemoglobin: 14.5 g/dL (ref 11.1–15.9)
Immature Grans (Abs): 0 10*3/uL (ref 0.0–0.1)
Immature Granulocytes: 0 %
Lymphocytes Absolute: 1.7 10*3/uL (ref 0.7–3.1)
Lymphs: 27 %
MCH: 29.9 pg (ref 26.6–33.0)
MCHC: 33 g/dL (ref 31.5–35.7)
MCV: 91 fL (ref 79–97)
Monocytes Absolute: 0.4 10*3/uL (ref 0.1–0.9)
Monocytes: 6 %
Neutrophils Absolute: 4.1 10*3/uL (ref 1.4–7.0)
Neutrophils: 64 %
Platelets: 236 10*3/uL (ref 150–379)
RBC: 4.85 x10E6/uL (ref 3.77–5.28)
RDW: 14.4 % (ref 12.3–15.4)
WBC: 6.4 10*3/uL (ref 3.4–10.8)

## 2017-11-09 LAB — COMPREHENSIVE METABOLIC PANEL
ALT: 18 IU/L (ref 0–32)
AST: 18 IU/L (ref 0–40)
Albumin/Globulin Ratio: 1.7 (ref 1.2–2.2)
Albumin: 4.3 g/dL (ref 3.6–4.8)
Alkaline Phosphatase: 65 IU/L (ref 39–117)
BUN/Creatinine Ratio: 21 (ref 12–28)
BUN: 20 mg/dL (ref 8–27)
Bilirubin Total: 0.3 mg/dL (ref 0.0–1.2)
CO2: 25 mmol/L (ref 20–29)
Calcium: 9.4 mg/dL (ref 8.7–10.3)
Chloride: 101 mmol/L (ref 96–106)
Creatinine, Ser: 0.97 mg/dL (ref 0.57–1.00)
GFR calc Af Amer: 69 mL/min/{1.73_m2} (ref >59)
GFR calc non Af Amer: 60 mL/min/{1.73_m2} (ref >59)
Globulin, Total: 2.5 g/dL (ref 1.5–4.5)
Glucose: 103 mg/dL — ABNORMAL HIGH (ref 65–99)
Potassium: 4.3 mmol/L (ref 3.5–5.2)
Sodium: 141 mmol/L (ref 134–144)
Total Protein: 6.8 g/dL (ref 6.0–8.5)

## 2017-11-09 LAB — TSH: TSH: 4.18 u[IU]/mL (ref 0.450–4.500)

## 2017-11-09 LAB — HEPATITIS C ANTIBODY: Hep C Virus Ab: <0.1 s/co ratio (ref 0.0–0.9)

## 2017-11-14 ENCOUNTER — Other Ambulatory Visit: Payer: Self-pay | Admitting: Family Medicine

## 2017-11-14 DIAGNOSIS — I10 Essential (primary) hypertension: Secondary | ICD-10-CM

## 2017-11-14 MED ORDER — METOPROLOL SUCCINATE ER 25 MG PO TB24: 25.0000 mg | ORAL_TABLET | Freq: Every day | ORAL | 3 refills | Status: DC

## 2017-11-14 NOTE — Telephone Encounter (Signed)
Pt contacted office for refill request on the following medications:  metoprolol succinate (TOPROL-XL) 25 MG 24 hr tablet  [place]  90 day supply  Pt is requesting that this be sent to mail order because it is cheaper than her getting it from her local pharmacy. Please advise. Thanks TNP

## 2017-11-19 ENCOUNTER — Other Ambulatory Visit: Payer: Self-pay | Admitting: Family Medicine

## 2017-11-19 DIAGNOSIS — I10 Essential (primary) hypertension: Secondary | ICD-10-CM

## 2017-11-28 ENCOUNTER — Encounter: Payer: Self-pay | Admitting: Family Medicine

## 2017-11-28 ENCOUNTER — Ambulatory Visit (INDEPENDENT_AMBULATORY_CARE_PROVIDER_SITE_OTHER): Payer: Medicare HMO | Admitting: Family Medicine

## 2017-11-28 VITALS — BP 124/60 | HR 54 | Temp 97.9°F | Resp 16

## 2017-11-28 DIAGNOSIS — R5383 Other fatigue: Secondary | ICD-10-CM | POA: Diagnosis not present

## 2017-11-28 DIAGNOSIS — R42 Dizziness and giddiness: Secondary | ICD-10-CM | POA: Diagnosis not present

## 2017-11-28 DIAGNOSIS — I10 Essential (primary) hypertension: Secondary | ICD-10-CM

## 2017-11-28 NOTE — Progress Notes (Signed)
Patient: Rebecca Patton Female    DOB: 1946-06-21   72 y.o.   MRN: [id number] Visit Date: 11/28/2017  Today's Provider: [name], MD    Chief Complaint  Patient presents with  . Hypertension   Subjective:    HPI  Hypertension, follow-up:  BP Readings from Last 3 Encounters:  11/28/17 124/60  11/07/17 (!) 148/72  11/01/17 (!) 144/64    She was last seen for hypertension 3 weeks ago.  BP at that visit was 148/72. Management since that visit includes add Metoprolol to 25 mg daily . She reports good compliance with treatment. She is not having side effects.  She is not exercising. She is adherent to low salt diet.   Outside blood pressures are 140/70's. She is experiencing fatigue.  Patient denies chest pain, chest pressure/discomfort, claudication, exertional chest pressure/discomfort, irregular heart beat, lower extremity edema, near-syncope, orthopnea, palpitations, paroxysmal nocturnal dyspnea, syncope and tachypnea.   Wt Readings from Last 3 Encounters:  11/07/17 205 lb (93 kg)  11/01/17 205 lb 12.8 oz (93.4 kg)  10/27/16 195 lb (88.5 kg)   ------------------------------------------------------------------------ Pt reports that she is feeling better as far as the dizziness goes but she is still having the fatigue. She reports that she has had some pains in her right arm, like numbness. It last a few minutes then it stops.        No Known Allergies   Current Outpatient Medications:  .  aspirin 81 MG tablet, Take 81 mg by mouth. , Disp: , Rfl:  .  hydrochlorothiazide (HYDRODIURIL) 25 MG tablet, TAKE 1 TABLET DAILY, Disp: 90 tablet, Rfl: 3 .  levothyroxine (SYNTHROID, LEVOTHROID) 50 MCG tablet, TAKE 1 TABLET DAILY BEFORE BREAKFAST, Disp: 90 tablet, Rfl: 3 .  metoprolol succinate (TOPROL-XL) 25 MG 24 hr tablet, Take 1 tablet (25 mg total) by mouth daily. Take with or immediately following a meal., Disp: 90 tablet, Rfl: 3 .  MULTIPLE VITAMIN PO, Take  by mouth., Disp: , Rfl:  .  OMEGA-3 FATTY ACIDS PO, Take 1,040 mg by mouth daily. , Disp: , Rfl:  .  quinapril (ACCUPRIL) 40 MG tablet, TAKE 1 TABLET DAILY, Disp: 90 tablet, Rfl: 3 .  vitamin B-12 (CYANOCOBALAMIN) 100 MCG tablet, Take 200 mcg by mouth daily., Disp: , Rfl:   Review of Systems  Constitutional: Positive for fatigue.  HENT: Positive for congestion and postnasal drip.   Eyes: Negative.   Respiratory: Negative.   Cardiovascular: Negative.   Gastrointestinal: Negative.   Endocrine: Negative.   Genitourinary: Negative.   Musculoskeletal: Positive for arthralgias.  Skin: Negative.   Allergic/Immunologic: Negative.   Neurological: Negative.   Hematological: Negative.   Psychiatric/Behavioral: Negative.     Social History   Tobacco Use  . Smoking status: Former Smoker    Last attempt to quit: 09/11/1997    Years since quitting: 20.2  . Smokeless tobacco: Never Used  Substance Use Topics  . Alcohol use: No    Alcohol/week: 0.0 oz   Objective:   BP 124/60 (BP Location: Left Arm, Patient Position: Sitting, Cuff Size: Large)   Pulse (!) 54   Temp 97.9 F (36.6 C) (Oral)   Resp 16  Vitals:   11/28/17 1430  BP: 124/60  Pulse: (!) 54  Resp: 16  Temp: 97.9 F (36.6 C)  TempSrc: Oral     Physical Exam  Constitutional: She is oriented to person, place, and time. She appears well-developed and well-nourished.  Eyes:  Conjunctivae and EOM are normal. Pupils are equal, round, and reactive to light.  Mild nystagmus to the left   Neck: Normal range of motion. Neck supple.  Cardiovascular: Normal rate, regular rhythm, normal heart sounds and intact distal pulses.  Pulmonary/Chest: Effort normal and breath sounds normal.  Musculoskeletal: Normal range of motion.  Neurological: She is alert and oriented to person, place, and time. She has normal reflexes.  Mild nystagmus to left.otherwise normal.  Skin: Skin is warm and dry.  Psychiatric: She has a normal mood and  affect. Her behavior is normal. Judgment and thought content normal.        Assessment & Plan:     1. Essential (primary) hypertension Improved. Continue current medications encouraged to exercise.     2. Dizziness/vertigo Improved. If does not continue to get better. Pt will call in and will send in a short dose of prednisone taper.   3. Other fatigue  4.Chronic allergic Rhinitis      HPI, Exam, and A&P Transcribed under the direction and in the presence of Braxxton Stoudt L. Cranford Mon, MD  Electronically Signed: Katina Dung, CMA    I have done the exam and reviewed the above chart and it is accurate to the best of my knowledge. Development worker, community has been used in this note in any air is in the dictation or transcription are unintentional.  [name], MD  [place]

## 2017-12-05 ENCOUNTER — Ambulatory Visit
Admission: RE | Admit: 2017-12-05 | Discharge: 2017-12-05 | Disposition: A | Discharge status: Home / Self Care | Payer: Medicare HMO | Source: Ambulatory Visit | Attending: Family Medicine | Admitting: Family Medicine

## 2017-12-05 DIAGNOSIS — Z1231 Encounter for screening mammogram for malignant neoplasm of breast: Secondary | ICD-10-CM | POA: Diagnosis not present

## 2018-01-04 ENCOUNTER — Encounter: Payer: Medicare HMO | Admitting: Obstetrics and Gynecology

## 2018-02-06 ENCOUNTER — Telehealth: Payer: Self-pay | Admitting: Family Medicine

## 2018-02-06 NOTE — Telephone Encounter (Signed)
Chart updated

## 2018-02-06 NOTE — Telephone Encounter (Signed)
Pt called saying she does not use any [facility]ies.  She uses [facility] or [facility] in [place] for local if she needs a local.  Thank s Con Memos

## 2018-02-08 ENCOUNTER — Ambulatory Visit (INDEPENDENT_AMBULATORY_CARE_PROVIDER_SITE_OTHER): Payer: Medicare HMO | Admitting: Physician Assistant

## 2018-02-08 ENCOUNTER — Encounter: Payer: Self-pay | Admitting: Physician Assistant

## 2018-02-08 VITALS — BP 132/72 | HR 60 | Temp 98.3°F | Resp 16 | Wt 207.0 lb

## 2018-02-08 DIAGNOSIS — R0981 Nasal congestion: Secondary | ICD-10-CM | POA: Diagnosis not present

## 2018-02-08 NOTE — Progress Notes (Signed)
Patient: Rebecca Patton Female    DOB: 10-May-1946   72 y.o.   MRN: [id number] Visit Date: 02/08/2018  Today's Provider: Trinna Post, PA-C   Chief Complaint  Patient presents with  . Sinusitis    Started last night   Subjective:    Rebecca Patton is a 72 y/o woman with history of sinus surgery who presents today with 12 hours of congestion. She reports sinus congestion. She has not tried any OTC medications including antihistamines, pain relievers.   Sinusitis  This is a recurrent problem. The current episode started yesterday. The problem has been gradually worsening since onset. There has been no fever. Associated symptoms include congestion, sinus pressure, a sore throat and swollen glands. Pertinent negatives include no chills, coughing, diaphoresis, ear pain, headaches, hoarse voice, neck pain, shortness of breath or sneezing. Past treatments include nothing. The treatment provided no relief.       No Known Allergies   Current Outpatient Medications:  .  aspirin 81 MG tablet, Take 81 mg by mouth. , Disp: , Rfl:  .  hydrochlorothiazide (HYDRODIURIL) 25 MG tablet, TAKE 1 TABLET DAILY, Disp: 90 tablet, Rfl: 3 .  levothyroxine (SYNTHROID, LEVOTHROID) 50 MCG tablet, TAKE 1 TABLET DAILY BEFORE BREAKFAST, Disp: 90 tablet, Rfl: 3 .  metoprolol succinate (TOPROL-XL) 25 MG 24 hr tablet, Take 1 tablet (25 mg total) by mouth daily. Take with or immediately following a meal., Disp: 90 tablet, Rfl: 3 .  MULTIPLE VITAMIN PO, Take by mouth., Disp: , Rfl:  .  OMEGA-3 FATTY ACIDS PO, Take 1,040 mg by mouth daily. , Disp: , Rfl:  .  quinapril (ACCUPRIL) 40 MG tablet, TAKE 1 TABLET DAILY, Disp: 90 tablet, Rfl: 3 .  vitamin B-12 (CYANOCOBALAMIN) 100 MCG tablet, Take 200 mcg by mouth daily., Disp: , Rfl:   Review of Systems  Constitutional: Positive for fatigue. Negative for activity change, appetite change, chills, diaphoresis, fever and unexpected weight change.  HENT: Positive for  congestion, postnasal drip, sinus pressure, sinus pain and sore throat. Negative for ear discharge, ear pain, hoarse voice, nosebleeds, rhinorrhea, sneezing, tinnitus, trouble swallowing and voice change.   Eyes: Negative.   Respiratory: Negative for cough and shortness of breath.   Gastrointestinal: Negative.   Musculoskeletal: Negative for neck pain.  Neurological: Negative for dizziness, light-headedness and headaches.    Social History   Tobacco Use  . Smoking status: Former Smoker    Last attempt to quit: 09/11/1997    Years since quitting: 20.4  . Smokeless tobacco: Never Used  Substance Use Topics  . Alcohol use: No    Alcohol/week: 0.0 oz   Objective:   BP 132/72 (BP Location: Right Arm, Patient Position: Sitting, Cuff Size: Large)   Pulse 60   Temp 98.3 F (36.8 C) (Oral)   Resp 16   Wt 207 lb (93.9 kg)   BMI 33.41 kg/m  Vitals:   02/08/18 1043  BP: 132/72  Pulse: 60  Resp: 16  Temp: 98.3 F (36.8 C)  TempSrc: Oral  Weight: 207 lb (93.9 kg)     Physical Exam  Constitutional: She is oriented to person, place, and time. She appears well-developed and well-nourished.  HENT:  Head: Normocephalic and atraumatic.  Right Ear: External ear normal.  Left Ear: External ear normal.  Nose: Right sinus exhibits no maxillary sinus tenderness and no frontal sinus tenderness. Left sinus exhibits no maxillary sinus tenderness and no frontal sinus tenderness.  Mouth/Throat:  Oropharynx is clear and moist.  Eyes: Pupils are equal, round, and reactive to light. Conjunctivae are normal. Right eye exhibits no discharge. Left eye exhibits no discharge.  Neck: Neck supple.  Cardiovascular: Normal rate and regular rhythm.  Pulmonary/Chest: Effort normal and breath sounds normal.  Lymphadenopathy:    She has no cervical adenopathy.  Neurological: She is alert and oriented to person, place, and time.  Skin: Skin is warm and dry.  Psychiatric: She has a normal mood and affect. Her  behavior is normal.        Assessment & Plan:     1. Sinus congestion  Patient does have a history of sinus surgery, and so I would be inclined to giver her antibiotics sooner than 10 days if she has persistent or worsening symptoms. However, she has had symptoms for a total of 12 hours which include sinus congestion but no fever, facial pain, or worsening of symptoms. She has not tried any over the counter medications including antihistamines or pain relief. She says she knows her body, she's been dealing with this for 20 years and she wants an antibiotic because she needs to "get ahead of it." She says "so you want me to continue suffering." I assured her I did not want her to suffer, but I think an antibiotic after 12 hours of symptoms is a little premature and has the potential to cause more harm than good, and does not prevent any infection. I have told her I will call in an antibiotic if her symptoms persist on Monday and recommended OTC treatment in the meantime.   Return if symptoms worsen or fail to improve.  The entirety of the information documented in the History of Present Illness, Review of Systems and Physical Exam were personally obtained by me. Portions of this information were initially documented by Ashley Royalty, CMA and reviewed by me for thoroughness and accuracy.          Trinna Post, PA-C  [facility]

## 2018-02-08 NOTE — Patient Instructions (Signed)

## 2018-02-12 ENCOUNTER — Telehealth: Payer: Self-pay | Admitting: Family Medicine

## 2018-02-12 ENCOUNTER — Other Ambulatory Visit: Payer: Self-pay

## 2018-02-12 MED ORDER — AMOXICILLIN-POT CLAVULANATE 875-125 MG PO TABS: 1.0000 | ORAL_TABLET | Freq: Two times a day (BID) | ORAL | 0 refills | Status: DC

## 2018-02-12 NOTE — Telephone Encounter (Signed)
Augmentin if not allergic.

## 2018-02-12 NOTE — Telephone Encounter (Signed)
done

## 2018-02-12 NOTE — Telephone Encounter (Signed)
Pas in last week and seen week and was told she could get an antibiotic if she wasn't feeling any better.  She would like an antbiobic sent in   She uses local pharmacy [facility]  Thanks teri

## 2018-02-12 NOTE — Telephone Encounter (Signed)
Pt saw Adriana on 02/08/18 and told pt if she was not better on Monday she would call in an antibiotic. Fabio Bering is not here this morning) please advise thanks.

## 2018-02-12 NOTE — Progress Notes (Signed)
mentin

## 2018-03-12 DIAGNOSIS — R69 Illness, unspecified: Secondary | ICD-10-CM | POA: Diagnosis not present

## 2018-04-20 ENCOUNTER — Encounter: Payer: Self-pay | Admitting: Obstetrics and Gynecology

## 2018-04-20 ENCOUNTER — Encounter

## 2018-04-20 ENCOUNTER — Ambulatory Visit (INDEPENDENT_AMBULATORY_CARE_PROVIDER_SITE_OTHER): Payer: Medicare HMO | Admitting: Obstetrics and Gynecology

## 2018-04-20 VITALS — BP 124/74 | HR 88 | Ht 66.0 in | Wt 206.3 lb

## 2018-04-20 DIAGNOSIS — Z01419 Encounter for gynecological examination (general) (routine) without abnormal findings: Secondary | ICD-10-CM | POA: Diagnosis not present

## 2018-04-20 NOTE — Progress Notes (Signed)
Subjective:   Rebecca Patton is a 72 y.o. G20P2 Caucasian female here for a routine well-woman exam.  No LMP recorded. Patient has had a hysterectomy.    Current complaints: arthritis flare up.  PCP: Rosanna Randy       doesn't desire labs  Social History: Sexual: heterosexual Marital Status: married Living situation: with spouse Occupation: PT Research scientist (physical sciences) at [employer] Tobacco/alcohol: no tobacco use Illicit drugs: no history of illicit drug use  The following portions of the patient's history were reviewed and updated as appropriate: allergies, current medications, past family history, past medical history, past social history, past surgical history and problem list.  Past Medical History Past Medical History:  Diagnosis Date  . Arthritis   . High blood pressure   . Uterine cancer [facility])     Past Surgical History Past Surgical History:  Procedure Laterality Date  . ABDOMINAL HYSTERECTOMY  2009    BSO  . BUNIONECTOMY    . EXCISION NEUROMA    . NASAL SINUS SURGERY    . TUBAL LIGATION      Gynecologic History G2P2  No LMP recorded. Patient has had a hysterectomy. Contraception: post menopausal status Last Pap: ?Marland Kitchen Results were: normal   Obstetric History OB History  Gravida Para Term Preterm AB Living  2 2          SAB TAB Ectopic Multiple Live Births               # Outcome Date GA Lbr Len/2nd Weight Sex Delivery Anes PTL Lv  2 Para 1972    F Vag-Spont     1 Para 1969    F Vag-Spont       Current Medications Current Outpatient Medications on File Prior to Visit  Medication Sig Dispense Refill  . amoxicillin-clavulanate (AUGMENTIN) 875-125 MG tablet Take 1 tablet by mouth 2 (two) times daily. 20 tablet 0  . aspirin 81 MG tablet Take 81 mg by mouth.     . hydrochlorothiazide (HYDRODIURIL) 25 MG tablet TAKE 1 TABLET DAILY 90 tablet 3  . levothyroxine (SYNTHROID, LEVOTHROID) 50 MCG tablet TAKE 1 TABLET DAILY BEFORE BREAKFAST 90 tablet 3  . metoprolol succinate  (TOPROL-XL) 25 MG 24 hr tablet Take 1 tablet (25 mg total) by mouth daily. Take with or immediately following a meal. 90 tablet 3  . MULTIPLE VITAMIN PO Take by mouth.    . OMEGA-3 FATTY ACIDS PO Take 1,040 mg by mouth daily.     . quinapril (ACCUPRIL) 40 MG tablet TAKE 1 TABLET DAILY 90 tablet 3  . vitamin B-12 (CYANOCOBALAMIN) 100 MCG tablet Take 200 mcg by mouth daily.     No current facility-administered medications on file prior to visit.     Review of Systems Patient denies any headaches, blurred vision, shortness of breath, chest pain, abdominal pain, problems with bowel movements, urination, or intercourse.  Objective:  There were no vitals taken for this visit. Physical Exam  General:  Well developed, well nourished, no acute distress. She is alert and oriented x3. Skin:  Warm and dry Neck:  Midline trachea, no thyromegaly or nodules Cardiovascular: Regular rate and rhythm, no murmur heard Lungs:  Effort normal, all lung fields clear to auscultation bilaterally Breasts:  No dominant palpable mass, retraction, or nipple discharge. Tenderness with increased density noted on underside of right breast in midline. Abdomen:  Soft, non tender, no hepatosplenomegaly or masses Pelvic:  External genitalia is normal in appearance.  The vagina is normal in appearance.  The cervix is bulbous, no CMT.  Thin prep pap is not done . Uterus is felt to be normal size, shape, and contour.  No adnexal masses or tenderness noted. Extremities:  No swelling or varicosities noted Psych:  She has a normal mood and affect  Assessment:   Healthy well-woman exam Right breast density with pain   Plan:  To watch are on right breast and if persist or worsens she will let me know and will order ultrasound. (declines MMG due to pain in that area caused at this years films) F/U  1 year for AE, or sooner if needed  Colonoscopy not due Melody Rockney Ghee, CNM

## 2018-05-08 ENCOUNTER — Encounter: Payer: Self-pay | Admitting: Obstetrics and Gynecology

## 2018-05-08 ENCOUNTER — Ambulatory Visit: Payer: Medicare HMO | Admitting: Obstetrics and Gynecology

## 2018-05-08 VITALS — BP 128/64 | HR 68 | Ht 66.0 in | Wt 206.8 lb

## 2018-05-08 DIAGNOSIS — R1900 Intra-abdominal and pelvic swelling, mass and lump, unspecified site: Secondary | ICD-10-CM

## 2018-05-08 NOTE — Patient Instructions (Signed)

## 2018-05-08 NOTE — Progress Notes (Signed)
  Subjective:     Patient ID: Rebecca Patton, female   DOB: 02-20-1946, 72 y.o.   MRN: [id number]  HPI Had upper abdominal bulging and pain, also has pain in back. Pain and pressure x 5 days, hurts to touch. Reports noticing a 'bulge' when she goes from laying down to sitting up for some time, but it has recently became painful.  Denies any fever, or nausea, or changes in BMs. Denies any recent trauma and does have history of hysterectomy and BTL.  Review of Systems  Constitutional: Negative.   HENT: Negative.   Eyes: Negative.   Respiratory: Negative.   Gastrointestinal: Positive for abdominal distention and abdominal pain.  Endocrine: Negative.   Genitourinary: Negative.   Musculoskeletal: Positive for back pain.  Skin: Negative.   Allergic/Immunologic: Negative.   Neurological: Negative.   Hematological: Negative.   Psychiatric/Behavioral: Negative.        Objective:   Physical Exam A&Ox4 Well female in no distress Blood pressure 128/64, pulse 68, height 5\' 6"  (1.676 m), weight 206 lb 12.8 oz (93.8 kg). Abdomen tender at midline with reducable bulging in middle of abdomen when laying down, tender to touch with 2 FB noted in herniation. Negative flank pain. Gallbladder not tender.     Assessment:     Retractable abdominal bulging with pain    Plan:     Abdominal ultrasound ordered and will refer to General surgery afterwards. Counseled on hernias and what would constitute ER visit.     [name],CNM

## 2018-05-09 ENCOUNTER — Ambulatory Visit
Admission: RE | Admit: 2018-05-09 | Discharge: 2018-05-09 | Disposition: A | Discharge status: Home / Self Care | Payer: Medicare HMO | Source: Ambulatory Visit | Attending: Obstetrics and Gynecology | Admitting: Obstetrics and Gynecology

## 2018-05-09 ENCOUNTER — Other Ambulatory Visit: Payer: Self-pay | Admitting: Obstetrics and Gynecology

## 2018-05-09 DIAGNOSIS — K439 Ventral hernia without obstruction or gangrene: Secondary | ICD-10-CM | POA: Diagnosis not present

## 2018-05-09 DIAGNOSIS — R1900 Intra-abdominal and pelvic swelling, mass and lump, unspecified site: Secondary | ICD-10-CM

## 2018-05-09 DIAGNOSIS — K429 Umbilical hernia without obstruction or gangrene: Secondary | ICD-10-CM | POA: Diagnosis not present

## 2018-05-10 ENCOUNTER — Telehealth: Payer: Self-pay

## 2018-05-10 ENCOUNTER — Ambulatory Visit (INDEPENDENT_AMBULATORY_CARE_PROVIDER_SITE_OTHER): Payer: Medicare HMO | Admitting: Surgery

## 2018-05-10 ENCOUNTER — Encounter: Payer: Self-pay | Admitting: Surgery

## 2018-05-10 VITALS — BP 161/77 | HR 59 | Temp 97.7°F | Ht 66.0 in | Wt 205.0 lb

## 2018-05-10 DIAGNOSIS — K429 Umbilical hernia without obstruction or gangrene: Secondary | ICD-10-CM

## 2018-05-10 NOTE — Telephone Encounter (Signed)
Medical clearance was faxed to patient's PCP. Awaiting on response to schedule surgery for patient. Patient's surgeon is Dr. Rosana Hoes.

## 2018-05-10 NOTE — Patient Instructions (Signed)
We will send a medical clearance to make sure that you are at your optimum health for surgery.  Once we have a response from your primary care doctor, we will call you to schedule your surgery.

## 2018-05-11 ENCOUNTER — Telehealth: Payer: Self-pay | Admitting: Family Medicine

## 2018-05-11 NOTE — Telephone Encounter (Signed)
Received medical clearance form  from [place], place form in providers box. Thanks
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
## 2018-05-15 ENCOUNTER — Ambulatory Visit (INDEPENDENT_AMBULATORY_CARE_PROVIDER_SITE_OTHER): Payer: Medicare HMO | Admitting: Family Medicine

## 2018-05-15 VITALS — BP 138/70 | HR 60 | Temp 97.9°F | Resp 16 | Wt 207.0 lb

## 2018-05-15 DIAGNOSIS — K439 Ventral hernia without obstruction or gangrene: Secondary | ICD-10-CM

## 2018-05-15 DIAGNOSIS — Z01818 Encounter for other preprocedural examination: Secondary | ICD-10-CM | POA: Diagnosis not present

## 2018-05-15 DIAGNOSIS — I10 Essential (primary) hypertension: Secondary | ICD-10-CM

## 2018-05-15 DIAGNOSIS — E7800 Pure hypercholesterolemia, unspecified: Secondary | ICD-10-CM | POA: Diagnosis not present
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
## 2018-05-16 ENCOUNTER — Other Ambulatory Visit: Payer: Self-pay

## 2018-05-16 ENCOUNTER — Telehealth: Payer: Self-pay

## 2018-05-16 ENCOUNTER — Encounter
Admission: RE | Admit: 2018-05-16 | Discharge: 2018-05-16 | Disposition: A | Discharge status: Home / Self Care | Payer: Medicare HMO | Source: Ambulatory Visit | Attending: Surgery | Admitting: Surgery

## 2018-05-16 ENCOUNTER — Encounter: Payer: Self-pay | Admitting: Surgery

## 2018-05-16 DIAGNOSIS — K432 Incisional hernia without obstruction or gangrene: Secondary | ICD-10-CM | POA: Diagnosis present

## 2018-05-16 DIAGNOSIS — Z7982 Long term (current) use of aspirin: Secondary | ICD-10-CM | POA: Diagnosis not present

## 2018-05-16 DIAGNOSIS — I10 Essential (primary) hypertension: Secondary | ICD-10-CM | POA: Diagnosis not present

## 2018-05-16 DIAGNOSIS — E669 Obesity, unspecified: Secondary | ICD-10-CM | POA: Diagnosis not present

## 2018-05-16 DIAGNOSIS — Z87891 Personal history of nicotine dependence: Secondary | ICD-10-CM | POA: Diagnosis not present

## 2018-05-16 DIAGNOSIS — R69 Illness, unspecified: Secondary | ICD-10-CM | POA: Diagnosis not present

## 2018-05-16 DIAGNOSIS — Z79899 Other long term (current) drug therapy: Secondary | ICD-10-CM | POA: Diagnosis not present

## 2018-05-16 DIAGNOSIS — F419 Anxiety disorder, unspecified: Secondary | ICD-10-CM | POA: Diagnosis not present

## 2018-05-16 DIAGNOSIS — M1990 Unspecified osteoarthritis, unspecified site: Secondary | ICD-10-CM | POA: Diagnosis not present

## 2018-05-16 DIAGNOSIS — Z6832 Body mass index (BMI) 32.0-32.9, adult: Secondary | ICD-10-CM | POA: Diagnosis not present

## 2018-05-16 DIAGNOSIS — F329 Major depressive disorder, single episode, unspecified: Secondary | ICD-10-CM | POA: Diagnosis not present

## 2018-05-16 HISTORY — DX: Major depressive disorder, single episode, unspecified: F32.9

## 2018-05-16 HISTORY — DX: Cardiac arrhythmia, unspecified: I49.9

## 2018-05-16 HISTORY — DX: Depression, unspecified: F32.A

## 2018-05-16 HISTORY — DX: Hypothyroidism, unspecified: E03.9

## 2018-05-16 LAB — CBC WITH DIFFERENTIAL/PLATELET
Basophils Absolute: 0.1 10*3/uL (ref 0–0.1)
Basophils Relative: 1 %
Eosinophils Absolute: 0.2 10*3/uL (ref 0–0.7)
Eosinophils Relative: 2 %
HCT: 45 % (ref 35.0–47.0)
Hemoglobin: 15.2 g/dL (ref 12.0–16.0)
Lymphocytes Relative: 19 %
Lymphs Abs: 1.7 10*3/uL (ref 1.0–3.6)
MCH: 29.9 pg (ref 26.0–34.0)
MCHC: 33.8 g/dL (ref 32.0–36.0)
MCV: 88.4 fL (ref 80.0–100.0)
Monocytes Absolute: 0.7 10*3/uL (ref 0.2–0.9)
Monocytes Relative: 8 %
Neutro Abs: 6.2 10*3/uL (ref 1.4–6.5)
Neutrophils Relative %: 70 %
Platelets: 230 10*3/uL (ref 150–440)
RBC: 5.1 MIL/uL (ref 3.80–5.20)
RDW: 14.5 % (ref 11.5–14.5)
WBC: 8.7 10*3/uL (ref 3.6–11.0)

## 2018-05-16 LAB — BASIC METABOLIC PANEL
Anion gap: 8 (ref 5–15)
BUN: 18 mg/dL (ref 8–23)
CO2: 28 mmol/L (ref 22–32)
Calcium: 9.3 mg/dL (ref 8.9–10.3)
Chloride: 103 mmol/L (ref 98–111)
Creatinine, Ser: 0.86 mg/dL (ref 0.44–1.00)
GFR calc Af Amer: >60 mL/min (ref >60)
GFR calc non Af Amer: >60 mL/min (ref >60)
Glucose, Bld: 120 mg/dL — ABNORMAL HIGH (ref 70–99)
Potassium: 3.6 mmol/L (ref 3.5–5.1)
Sodium: 139 mmol/L (ref 135–145)

## 2018-05-16 NOTE — Patient Instructions (Signed)
Your procedure is scheduled on: 05/18/18 Report to Day Surgery. [facility]OND FLOOR To find out your arrival time please call [phone] between 1PM - 3PM on 05/17/18  Remember: Instructions that are not followed completely may result in serious medical risk,  up to and including death, or upon the discretion of your surgeon and anesthesiologist your  surgery may need to be rescheduled.     _X__ 1. Do not eat food after midnight the night before your procedure.                 No gum chewing or hard candies. You may drink clear liquids up to 2 hours                 before you are scheduled to arrive for your surgery- DO not drink clear                 liquids within 2 hours of the start of your surgery.                 Clear Liquids include:  water, apple juice without pulp, clear carbohydrate                 drink such as Clearfast of Gatorade, Black Coffee or Tea (Do not add                 anything to coffee or tea).  __X__2.  On the morning of surgery brush your teeth with toothpaste and water, you                may rinse your mouth with mouthwash if you wish.  Do not swallow any toothpaste of mouthwash.     _X__ 3.  No Alcohol for 24 hours before or after surgery.   _X__ 4.  Do Not Smoke or use e-cigarettes For 24 Hours Prior to Your Surgery.                 Do not use any chewable tobacco products for at least 6 hours prior to                 surgery.  ____  5.  Bring all medications with you on the day of surgery if instructed.   ___X_  6.  Notify your doctor if there is any change in your medical condition      (cold, fever, infections).     Do not wear jewelry, make-up, hairpins, clips or nail polish. Do not wear lotions, powders, or perfumes. You may wear deodorant. Do not shave 48 hours prior to surgery. Men may shave face and neck. Do not bring valuables to the hospital.    [facility] is not responsible for any belongings or  valuables.  Contacts, dentures or bridgework may not be worn into surgery. Leave your suitcase in the car. After surgery it may be brought to your room. For patients admitted to the hospital, discharge time is determined by your treatment team.   Patients discharged the day of surgery will not be allowed to drive home.   Please read over the following fact sheets that you were given:   Surgical Site Infection Prevention    __X_ Take these medicines the morning of surgery with A SIP OF WATER:    1.LEVOTHROID  2. METOPROLOL  3.   4.  5.  6.  ____ Fleet Enema (as directed)   __X__ Use CHG Soap as directed  ____ Use inhalers on the day of surgery  ____ Stop metformin 2 days prior to surgery    ____ Take 1/2 of usual insulin dose the night before surgery. No insulin the morning          of surgery.   __X__ Stop Coumadin/Plavix/aspirin on      ALREADY STOPPED ASPIRIN  ____ Stop Anti-inflammatories on    _X___ Stop supplements until after surgery.  STOP FISH OIL TODAY 05/16/18  ____ Bring C-Pap to the hospital.

## 2018-05-16 NOTE — Telephone Encounter (Signed)
We received medical clearance from patient's PCP. She will have her surgery done this Friday 05/18/2018.

## 2018-05-16 NOTE — Telephone Encounter (Signed)
Patient's FMLA form was filled out and faxed to [phone] per patient's request.

## 2018-05-16 NOTE — Telephone Encounter (Signed)
Patient called and would like to get her surgery set up for this Friday. The patient is scheduled for repair of umbilical hernia at [facility] with Dr Rosana Hoes on 05/18/18. We have obtained medical clearance for her. She will pre admit by phone. The patient is aware of date and instructions.

## 2018-05-17 ENCOUNTER — Encounter: Payer: Self-pay | Admitting: Anesthesiology

## 2018-05-17 MED ORDER — CEFAZOLIN SODIUM-DEXTROSE 2-4 GM/100ML-% IV SOLN
2.0000 g | INTRAVENOUS | Status: AC
  Administered 2018-05-18: 2 g via INTRAVENOUS

## 2018-05-18 ENCOUNTER — Other Ambulatory Visit: Payer: Self-pay

## 2018-05-18 ENCOUNTER — Encounter
Admission: RE | Disposition: A | Discharge status: Home / Self Care | Payer: Self-pay | Source: Ambulatory Visit | Attending: Surgery

## 2018-05-18 ENCOUNTER — Ambulatory Visit: Payer: Medicare HMO | Admitting: Anesthesiology

## 2018-05-18 ENCOUNTER — Ambulatory Visit
Admission: RE | Admit: 2018-05-18 | Discharge: 2018-05-18 | Disposition: A | Discharge status: Home / Self Care | Payer: Medicare HMO | Source: Ambulatory Visit | Attending: Surgery | Admitting: Surgery

## 2018-05-18 DIAGNOSIS — I10 Essential (primary) hypertension: Secondary | ICD-10-CM | POA: Diagnosis not present

## 2018-05-18 DIAGNOSIS — E669 Obesity, unspecified: Secondary | ICD-10-CM | POA: Diagnosis not present

## 2018-05-18 DIAGNOSIS — Z6832 Body mass index (BMI) 32.0-32.9, adult: Secondary | ICD-10-CM | POA: Insufficient documentation

## 2018-05-18 DIAGNOSIS — F419 Anxiety disorder, unspecified: Secondary | ICD-10-CM | POA: Insufficient documentation

## 2018-05-18 DIAGNOSIS — Z79899 Other long term (current) drug therapy: Secondary | ICD-10-CM | POA: Diagnosis not present

## 2018-05-18 DIAGNOSIS — Z7982 Long term (current) use of aspirin: Secondary | ICD-10-CM | POA: Diagnosis not present

## 2018-05-18 DIAGNOSIS — Z87891 Personal history of nicotine dependence: Secondary | ICD-10-CM | POA: Diagnosis not present

## 2018-05-18 DIAGNOSIS — K432 Incisional hernia without obstruction or gangrene: Secondary | ICD-10-CM | POA: Insufficient documentation

## 2018-05-18 DIAGNOSIS — M1990 Unspecified osteoarthritis, unspecified site: Secondary | ICD-10-CM | POA: Insufficient documentation

## 2018-05-18 DIAGNOSIS — K429 Umbilical hernia without obstruction or gangrene: Secondary | ICD-10-CM | POA: Diagnosis not present

## 2018-05-18 DIAGNOSIS — R69 Illness, unspecified: Secondary | ICD-10-CM | POA: Diagnosis not present

## 2018-05-18 DIAGNOSIS — F329 Major depressive disorder, single episode, unspecified: Secondary | ICD-10-CM | POA: Insufficient documentation

## 2018-05-18 HISTORY — PX: INSERTION OF MESH: SHX5868

## 2018-05-18 HISTORY — PX: UMBILICAL HERNIA REPAIR: SHX196

## 2018-05-18 HISTORY — DX: Methicillin resistant Staphylococcus aureus infection, unspecified site: A49.02

## 2018-05-18 SURGERY — REPAIR, HERNIA, UMBILICAL, ADULT
Anesthesia: General | Site: Abdomen | Wound class: Clean

## 2018-05-18 MED ORDER — SUCCINYLCHOLINE 20MG/ML (10ML) SYRINGE FOR MEDFUSION PUMP - OPTIME
INTRAMUSCULAR | Status: DC | PRN
  Administered 2018-05-18: 100 mg via INTRAVENOUS

## 2018-05-18 MED ORDER — PROPOFOL 10 MG/ML IV BOLUS
INTRAVENOUS | Status: DC | PRN
  Administered 2018-05-18: 120 mg via INTRAVENOUS

## 2018-05-18 MED ORDER — ROCURONIUM BROMIDE 100 MG/10ML IV SOLN
INTRAVENOUS | Status: DC | PRN
  Administered 2018-05-18: 10 mg via INTRAVENOUS
  Administered 2018-05-18: 30 mg via INTRAVENOUS

## 2018-05-18 MED ORDER — ONDANSETRON HCL 4 MG/2ML IJ SOLN
INTRAMUSCULAR | Status: DC | PRN
  Administered 2018-05-18: 4 mg via INTRAVENOUS

## 2018-05-18 MED ORDER — LIDOCAINE HCL (PF) 1 % IJ SOLN
INTRAMUSCULAR | Status: AC
  Filled 2018-05-18: qty 30

## 2018-05-18 MED ORDER — FAMOTIDINE 20 MG PO TABS
ORAL_TABLET | ORAL | Status: AC
  Administered 2018-05-18: 20 mg via ORAL
  Filled 2018-05-18: qty 1

## 2018-05-18 MED ORDER — LACTATED RINGERS IV SOLN
INTRAVENOUS | Status: DC
  Administered 2018-05-18: 06:00:00 via INTRAVENOUS

## 2018-05-18 MED ORDER — DEXAMETHASONE SODIUM PHOSPHATE 10 MG/ML IJ SOLN
INTRAMUSCULAR | Status: DC | PRN
  Administered 2018-05-18: 5 mg via INTRAVENOUS

## 2018-05-18 MED ORDER — LIDOCAINE HCL (CARDIAC) PF 100 MG/5ML IV SOSY
PREFILLED_SYRINGE | INTRAVENOUS | Status: DC | PRN
  Administered 2018-05-18: 100 mg via INTRATRACHEAL

## 2018-05-18 MED ORDER — PROPOFOL 10 MG/ML IV BOLUS
INTRAVENOUS | Status: AC
  Filled 2018-05-18: qty 20

## 2018-05-18 MED ORDER — FENTANYL CITRATE (PF) 100 MCG/2ML IJ SOLN
INTRAMUSCULAR | Status: DC | PRN
  Administered 2018-05-18: 100 ug via INTRAVENOUS

## 2018-05-18 MED ORDER — OXYCODONE-ACETAMINOPHEN 5-325 MG PO TABS
1.0000 | ORAL_TABLET | Freq: Once | ORAL | Status: AC
  Administered 2018-05-18: 1 via ORAL

## 2018-05-18 MED ORDER — GABAPENTIN 300 MG PO CAPS
ORAL_CAPSULE | ORAL | Status: AC
  Administered 2018-05-18: 300 mg via ORAL
  Filled 2018-05-18: qty 1

## 2018-05-18 MED ORDER — LIDOCAINE HCL 1 % IJ SOLN
INTRAMUSCULAR | Status: DC | PRN
  Administered 2018-05-18: 20 mL

## 2018-05-18 MED ORDER — NEOSTIGMINE METHYLSULFATE 10 MG/10ML IV SOLN
INTRAVENOUS | Status: DC | PRN
  Administered 2018-05-18: 2 mg via INTRAVENOUS

## 2018-05-18 MED ORDER — FENTANYL CITRATE (PF) 100 MCG/2ML IJ SOLN
INTRAMUSCULAR | Status: AC
  Filled 2018-05-18: qty 2

## 2018-05-18 MED ORDER — GABAPENTIN 300 MG PO CAPS
300.0000 mg | ORAL_CAPSULE | ORAL | Status: AC
  Administered 2018-05-18: 300 mg via ORAL

## 2018-05-18 MED ORDER — LIDOCAINE HCL (CARDIAC) PF 100 MG/5ML IV SOSY: PREFILLED_SYRINGE | INTRAVENOUS | Status: DC | PRN

## 2018-05-18 MED ORDER — OXYCODONE-ACETAMINOPHEN 5-325 MG PO TABS
ORAL_TABLET | ORAL | Status: AC
  Filled 2018-05-18: qty 1

## 2018-05-18 MED ORDER — BUPIVACAINE HCL (PF) 0.5 % IJ SOLN
INTRAMUSCULAR | Status: AC
  Filled 2018-05-18: qty 30

## 2018-05-18 MED ORDER — CHLORHEXIDINE GLUCONATE CLOTH 2 % EX PADS: 6.0000 | MEDICATED_PAD | Freq: Once | CUTANEOUS | Status: DC

## 2018-05-18 MED ORDER — ACETAMINOPHEN 500 MG PO TABS
ORAL_TABLET | ORAL | Status: AC
  Administered 2018-05-18: 1000 mg via ORAL
  Filled 2018-05-18: qty 2

## 2018-05-18 MED ORDER — FAMOTIDINE 20 MG PO TABS
20.0000 mg | ORAL_TABLET | Freq: Once | ORAL | Status: AC
  Administered 2018-05-18: 20 mg via ORAL

## 2018-05-18 MED ORDER — MIDAZOLAM HCL 5 MG/5ML IJ SOLN
INTRAMUSCULAR | Status: DC | PRN
  Administered 2018-05-18: 1 mg via INTRAVENOUS

## 2018-05-18 MED ORDER — OXYCODONE-ACETAMINOPHEN 5-325 MG PO TABS: 1.0000 | ORAL_TABLET | ORAL | 0 refills | Status: DC | PRN

## 2018-05-18 MED ORDER — SEVOFLURANE IN SOLN
RESPIRATORY_TRACT | Status: AC
  Filled 2018-05-18: qty 250

## 2018-05-18 MED ORDER — CEFAZOLIN SODIUM-DEXTROSE 2-4 GM/100ML-% IV SOLN
INTRAVENOUS | Status: AC
  Filled 2018-05-18: qty 100

## 2018-05-18 MED ORDER — MIDAZOLAM HCL 2 MG/2ML IJ SOLN
INTRAMUSCULAR | Status: AC
  Filled 2018-05-18: qty 2

## 2018-05-18 MED ORDER — GLYCOPYRROLATE 0.2 MG/ML IJ SOLN
INTRAMUSCULAR | Status: DC | PRN
  Administered 2018-05-18: 0.2 mg via INTRAVENOUS
  Administered 2018-05-18: 0.4 mg via INTRAVENOUS

## 2018-05-18 MED ORDER — FENTANYL CITRATE (PF) 100 MCG/2ML IJ SOLN: 25.0000 ug | INTRAMUSCULAR | Status: DC | PRN

## 2018-05-18 MED ORDER — ACETAMINOPHEN 500 MG PO TABS
1000.0000 mg | ORAL_TABLET | ORAL | Status: AC
  Administered 2018-05-18: 1000 mg via ORAL

## 2018-05-18 MED ORDER — ONDANSETRON HCL 4 MG/2ML IJ SOLN: 4.0000 mg | Freq: Once | INTRAMUSCULAR | Status: DC | PRN

## 2018-05-18 SURGICAL SUPPLY — 25 items
CHLORAPREP W/TINT 26ML (MISCELLANEOUS) ×2 IMPLANT
DECANTER SPIKE VIAL GLASS SM (MISCELLANEOUS) ×4 IMPLANT
DERMABOND ADVANCED (GAUZE/BANDAGES/DRESSINGS) ×1
DERMABOND ADVANCED .7 DNX12 (GAUZE/BANDAGES/DRESSINGS) ×1 IMPLANT
DRAPE LAPAROTOMY 77X122 PED (DRAPES) ×2 IMPLANT
ELECT REM PT RETURN 9FT ADLT (ELECTROSURGICAL) ×2
ELECTRODE REM PT RTRN 9FT ADLT (ELECTROSURGICAL) ×1 IMPLANT
GLOVE BIO SURGEON STRL SZ7 (GLOVE) ×2 IMPLANT
GLOVE BIOGEL PI IND STRL 7.5 (GLOVE) ×1 IMPLANT
GLOVE BIOGEL PI INDICATOR 7.5 (GLOVE) ×1
GOWN STRL REUS W/ TWL LRG LVL3 (GOWN DISPOSABLE) ×1 IMPLANT
GOWN STRL REUS W/TWL LRG LVL3 (GOWN DISPOSABLE) ×1
INSERT FOAM CUSHION (MISCELLANEOUS) IMPLANT
KIT TURNOVER KIT A (KITS) ×2 IMPLANT
MESH VENTRALEX ST 2.5 CRC MED (Mesh General) ×2 IMPLANT
NEEDLE HYPO 22GX1.5 SAFETY (NEEDLE) ×2 IMPLANT
NS IRRIG 1000ML POUR BTL (IV SOLUTION) ×2 IMPLANT
PACK BASIN MINOR ARMC (MISCELLANEOUS) ×2 IMPLANT
SUT ETHIBOND 0 MO6 C/R (SUTURE) ×2 IMPLANT
SUT MNCRL AB 4-0 PS2 18 (SUTURE) ×2 IMPLANT
SUT VIC AB 2-0 SH 27 (SUTURE) ×1
SUT VIC AB 2-0 SH 27XBRD (SUTURE) ×1 IMPLANT
SUT VIC AB 3-0 SH 27 (SUTURE) ×1
SUT VIC AB 3-0 SH 27X BRD (SUTURE) ×1 IMPLANT
SYR 10ML LL (SYRINGE) ×2 IMPLANT

## 2018-05-18 NOTE — Anesthesia Postprocedure Evaluation (Signed)
Anesthesia Post Note  Patient: Rebecca Patton  Procedure(s) Performed: HERNIA REPAIR SUPRA-UMBILICAL ADULT (N/A Abdomen) INSERTION OF MESH (N/A Abdomen)  Patient location during evaluation: PACU Anesthesia Type: General Level of consciousness: awake and alert Pain management: pain level controlled Vital Signs Assessment: post-procedure vital signs reviewed and stable Respiratory status: spontaneous breathing, nonlabored ventilation, respiratory function stable and patient connected to nasal cannula oxygen Cardiovascular status: blood pressure returned to baseline and stable Postop Assessment: no apparent nausea or vomiting Anesthetic complications: no     Last Vitals:  Vitals:   05/18/18 1046 05/18/18 1102  BP: (!) 154/58 (!) 141/54  Pulse: (!) 54   Resp: 16   Temp:    SpO2: 98%     Last Pain:  Vitals:   05/18/18 1046  TempSrc:   PainSc: 2                  Ikenna Ohms S

## 2018-05-18 NOTE — Discharge Instructions (Addendum)
In addition to included general post-operative instructions for Open Repair of Supra-Umbilical Incisional Hernia with mesh,  Diet: Resume home heart healthy diet.   Activity: No heavy lifting >15 - 20 pounds (children, pets, laundry, garbage) or strenuous activity until follow-up, but light activity and walking are encouraged. Do not drive or drink alcohol if taking narcotic pain medications.  Wound care: 2 days after surgery (Sunday, 9/8), you may shower/get incision wet with soapy water and pat dry (do not rub incisions), but no baths or submerging incision underwater until follow-up.   Medications: Resume all home medications. For mild to moderate pain: acetaminophen (Tylenol) or ibuprofen/naproxen (if no kidney disease). Combining Tylenol with alcohol can substantially increase your risk of causing liver disease. Narcotic pain medications, if prescribed, can be used for severe pain, though may cause nausea, constipation, and drowsiness. Do not combine Tylenol and Percocet (or similar) within a 6 hour period as Percocet (and similar) contain(s) Tylenol. If you do not need the narcotic pain medication, you do not need to fill the prescription.  Call office [phone]) at any time if any questions, worsening pain, fevers/chills, bleeding, drainage from incision site, or other concerns.  AMBULATORY SURGERY  DISCHARGE INSTRUCTIONS   1) The drugs that you were given will stay in your system until tomorrow so for the next 24 hours you should not:  A) Drive an automobile B) Make any legal decisions C) Drink any alcoholic beverage   2) You may resume regular meals tomorrow.  Today it is better to start with liquids and gradually work up to solid foods.  You may eat anything you prefer, but it is better to start with liquids, then soup and crackers, and gradually work up to solid foods.   3) Please notify your doctor immediately if you have any unusual bleeding, trouble breathing, redness  and pain at the surgery site, drainage, fever, or pain not relieved by medication.    4) Additional Instructions:        Please contact your physician with any problems or Same Day Surgery at [phone], Monday through Friday 6 am to 4 pm, or [place] at [facility] number at [phone].

## 2018-05-18 NOTE — Op Note (Signed)
SURGICAL OPERATIVE REPORT  DATE OF PROCEDURE: 05/18/2018  ATTENDING Surgeon(s): Vickie Epley, MD  ANESTHESIA: GETA  PRE-OPERATIVE DIAGNOSIS: Increasingly symptomatic (painful) reducible supra-umbilical incisional port site hernia (icd-10: K43.2)  POST-OPERATIVE DIAGNOSIS: Increasingly symptomatic (painful) reducible supra-umbilical incisional port site hernia (icd-10: K43.2)  PROCEDURE(S):  1.) Open repair of increasingly symptomatic (painful) supra-umbilical incisional hernia (cpt: 49561) 2.) Implantation of mesh for open repair of incisional abdominal wall hernia (cpt: 48889)  INTRAOPERATIVE FINDINGS: 1.5 - 2 cm umbilical hernia with adherent omentum and hernia sac  INTRAVENOUS FLUIDS: 700 mL crystalloid   ESTIMATED BLOOD LOSS: Minimal (<20 mL)   URINE OUTPUT: No Foley catheter  SPECIMENS: None  IMPLANTS: 6.4 cm Bard Ventralex ST ventral hernia repair mesh  DRAINS: None  COMPLICATIONS: None apparent  CONDITION AT END OF PROCEDURE: Hemodynamically stable and extubated  DISPOSITION OF PATIENT: PACU  INDICATIONS FOR PROCEDURE:  Patient is a 72 y.o. female who presented upon referral from her PMD for evaluation of patient's supra-umbilical incisional port site hernia. She denied abdominal distention, change in bowel function, or N/V and likewise denied constipation, straining with urination, or frequent coughing and expressed that she'd like to have it repaired. All risks, benefits, and alternatives to above elective procedure were discussed with the patient, all of patient's questions were answered to her expressed satisfaction, and informed consent was obtained and documented accordingly.  DETAILS OF PROCEDURE: Patient was brought to the operating suite and appropriately identified. General anesthesia was administered along with appropriate pre-operative antibiotics, and endotracheal intubation was performed by anesthetist. In supine position, operative site was prepped  and draped in the usual sterile fashion, and following a brief time out, local anesthetic was injected superior to the umbilicus, and a 2 - 3 cm vertical midline incision was made using a #15 blade scalpel and extended deep through subcutaneous tissues using blunt dissection and electrocautery. The hernia sac was then dissected from the fascial defect and peritoneum, which was cleared of all omental adhesions. A 6.4 cm Bard Ventralex ST mesh patch was selected, inserted, confirmed to approximate well to fascial edges without intervening viscera or omentum, and patch was secured without tension using Ethibond braided non-absorbable suture. The umbilicus was then invaginated using 2-0 Vicryl suture from the dermis to fascia, and the wound was irrigated using sterile saline. Overlying tissues were re-approximated in layers using buried interrupted 3-0 Vicryl suture for dermis and interrupted subcuticular 4-0 Monocryl suture where indicated to re-approximate skin, which was cleaned and dried and sterile Dermabond skin glue was applied and allowed to dry.  Patient was then safely able to be extubated, awakened, and transferred to PACU for post-operative monitoring and care.  I was present for all aspects of the above procedure, and no operative complications were apparent.

## 2018-05-18 NOTE — Transfer of Care (Signed)
Immediate Anesthesia Transfer of Care Note  Patient: Rebecca Patton  Procedure(s) Performed: HERNIA REPAIR SUPRA-UMBILICAL ADULT (N/A Abdomen) INSERTION OF MESH (N/A Abdomen)  Patient Location: PACU  Anesthesia Type:General  Level of Consciousness: awake and drowsy  Airway & Oxygen Therapy: Patient Spontanous Breathing and Patient connected to face mask oxygen  Post-op Assessment: Report given to RN and Post -op Vital signs reviewed and stable  Post vital signs: Reviewed and stable  Last Vitals:  Vitals Value Taken Time  BP 154/69 05/18/2018  9:15 AM  Temp 36.8 C 05/18/2018  9:13 AM  Pulse 56 05/18/2018  9:17 AM  Resp 15 05/18/2018  9:17 AM  SpO2 100 % 05/18/2018  9:17 AM  Vitals shown include unvalidated device data.  Last Pain:  Vitals:   05/18/18 0606  TempSrc: Temporal  PainSc: 0-No pain         Complications: No apparent anesthesia complications

## 2018-05-18 NOTE — Anesthesia Procedure Notes (Signed)
Procedure Name: Intubation Date/Time: 05/18/2018 7:43 AM Performed by: Babs Sciara, CRNA Pre-anesthesia Checklist: Patient identified, Emergency Drugs available, Suction available, Patient being monitored and Timeout performed Patient Re-evaluated:Patient Re-evaluated prior to induction Oxygen Delivery Method: Circle system utilized Preoxygenation: Pre-oxygenation with 100% oxygen Induction Type: IV induction Ventilation: Mask ventilation without difficulty and Oral airway inserted - appropriate to patient size Laryngoscope Size: Mac and 3 Grade View: Grade I Tube type: Oral Tube size: 7.5 mm Number of attempts: 1 Airway Equipment and Method: Stylet Placement Confirmation: ETT inserted through vocal cords under direct vision,  positive ETCO2 and breath sounds checked- equal and bilateral Secured at: 21 (lip) cm Tube secured with: Tape Dental Injury: Teeth and Oropharynx as per pre-operative assessment

## 2018-05-18 NOTE — Anesthesia Post-op Follow-up Note (Signed)
Anesthesia QCDR form completed.        

## 2018-05-18 NOTE — Anesthesia Preprocedure Evaluation (Addendum)
Anesthesia Evaluation  Patient identified by MRN, date of birth, ID band Patient awake    Reviewed: Allergy & Precautions, NPO status , Patient's Chart, lab work & pertinent test results, reviewed documented beta blocker date and time   Airway Mallampati: III  TM Distance: >3 FB     Dental  (+) Chipped   Pulmonary former smoker,           Cardiovascular hypertension, Pt. on medications and Pt. on home beta blockers + dysrhythmias      Neuro/Psych PSYCHIATRIC DISORDERS Anxiety Depression    GI/Hepatic   Endo/Other    Renal/GU      Musculoskeletal  (+) Arthritis ,   Abdominal   Peds  Hematology   Anesthesia Other Findings Obese. Constant sinus drainage. Uvula and part of the hard palate removed in the past.  Reproductive/Obstetrics                            Anesthesia Physical Anesthesia Plan  ASA: II  Anesthesia Plan: General   Post-op Pain Management:    Induction: Intravenous  PONV Risk Score and Plan:   Airway Management Planned: Oral ETT and LMA  Additional Equipment:   Intra-op Plan:   Post-operative Plan:   Informed Consent: I have reviewed the patients History and Physical, chart, labs and discussed the procedure including the risks, benefits and alternatives for the proposed anesthesia with the patient or authorized representative who has indicated his/her understanding and acceptance.     Plan Discussed with: CRNA  Anesthesia Plan Comments:         Anesthesia Quick Evaluation

## 2018-05-18 NOTE — Interval H&P Note (Signed)
History and Physical Interval Note:  05/18/2018 7:25 AM  Rebecca Patton  has presented today for surgery, with the diagnosis of [place]  The various methods of treatment have been discussed with the patient and family. After consideration of risks, benefits and other options for treatment, the patient has consented to  Procedure(s): HERNIA REPAIR SUPRA-UMBILICAL ADULT (N/A) INSERTION OF MESH (N/A) as a surgical intervention .  The patient's history has been reviewed, patient examined, no change in status, stable for surgery.  I have reviewed the patient's chart and labs.  Questions were answered to the patient's satisfaction.     Vickie Epley

## 2018-05-25 ENCOUNTER — Ambulatory Visit (INDEPENDENT_AMBULATORY_CARE_PROVIDER_SITE_OTHER): Payer: Medicare HMO | Admitting: Surgery

## 2018-05-25 ENCOUNTER — Encounter: Payer: Self-pay | Admitting: Surgery

## 2018-05-25 ENCOUNTER — Telehealth: Payer: Self-pay

## 2018-05-25 ENCOUNTER — Telehealth: Payer: Self-pay | Admitting: *Deleted

## 2018-05-25 ENCOUNTER — Ambulatory Visit: Payer: Self-pay

## 2018-05-25 VITALS — BP 130/70 | HR 86 | Temp 97.9°F | Resp 12 | Ht 67.0 in | Wt 202.0 lb

## 2018-05-25 DIAGNOSIS — K429 Umbilical hernia without obstruction or gangrene: Secondary | ICD-10-CM

## 2018-05-25 DIAGNOSIS — Z09 Encounter for follow-up examination after completed treatment for conditions other than malignant neoplasm: Secondary | ICD-10-CM

## 2018-05-25 MED ORDER — IBUPROFEN 600 MG PO TABS: 600.0000 mg | ORAL_TABLET | Freq: Three times a day (TID) | ORAL | 0 refills | Status: DC | PRN

## 2018-05-25 NOTE — Progress Notes (Signed)
05/25/2018  HPI: Patient is s/p periumbilical hernia repair with mesh by Dr. Rosana Hoes on 9/6.  She called today because she felt a "popping" sensation yesterday and started having more pain at the surgical site.  She also reports that she can feel a lump which feels like her hernia prior to surgical repair.  She was given an appointment for Tuesday 9/17 but she then called again because her pain continued and she wanted to be seen sooner.  She denies any fevers, chills, chest pain, shortness of breath, nausea, or vomiting.  She reports she was constipated until 4 days ago but has been having bowel movements and not straining hard.  She also has not been exerting herself too much and has not done any heavy lifting.  Denies any skin changes except for post-surgical bruising.  Vital signs: BP 130/70   Pulse 86   Temp 97.9 F (36.6 C) (Skin)   Resp 12   Ht 5\' 7"  (1.702 m)   Wt 202 lb (91.6 kg)   BMI 31.64 kg/m    Physical Exam: Constitutional: No acute distress Abdomen:  Soft, obese, nondistended, with tenderness to palpation at the incision above the umbilicus.  There is surrounding ecchymosis.  No drainage or other skin changes.  Posterior to the incision is an area of firm tissue, but unclear if there is any recurrence or hernia defect.  Ultrasound was used and no clear defect or tissue protruding through.  Assessment/Plan: 72 yo female s/p periumbilical hernia repair with mesh.  Based on exam and ultrasound at bedside, I cannot confirm that there is a recurrence of her hernia.  The mesh appears to be in place.  I suspect that the lump that the patient is feeling is post-surgical changes and scarring, and if so, this would continue to improve as she heals.  Discussed with the patient that a CT scan may not show anything but post-surgical changes and inflammation.  Recommended that she can start taking Ibuprofen 600 mg every 8 hrs as needed and apply ice packs to the wound to help with pain.  If  there is no improvement or if there is any worsening, then she should come to the ED for further evaluation.  She will come back on Tuesday 9/17 to see Dr. Rosana Hoes as scheduled.   Melvyn Neth, [facility]

## 2018-05-25 NOTE — Telephone Encounter (Signed)
Patient called and stated that she had umbilical hernia surgery with Dr.Davis on 05/18/18 and she stated that since yesterday she feels like the hernia has came back. Please call and advise

## 2018-05-25 NOTE — Telephone Encounter (Signed)
Spoke with patient. She states she feels her hernia is back. We discussed she could be feeling the mesh that was used, however patient is adamant that the hernia is back and is requesting to be seen. She states she is having pain as she had before however she is not using the Oxycodone . She feels a lump moving around.   Spoke with Dr.Davis and he advised adding her to schedule on 05/29/18.  Called patient back and she stated she is going to see her daughter ( paramedic) and see if she thinks her hernia has come back as she was the one who diagnosed it.  She was given the option to being seen in the emergency room and is not for sure she will go. I made an appointment 05/29/18 @ 2:15.

## 2018-05-25 NOTE — Telephone Encounter (Signed)
Patient added to Dr.Piscoy's schedule today.

## 2018-05-25 NOTE — Patient Instructions (Addendum)
Return in on 05/29/2018. Use ice.

## 2018-05-28 NOTE — Telephone Encounter (Signed)
Patient came in on 05/25/2018.

## 2018-05-29 ENCOUNTER — Ambulatory Visit (INDEPENDENT_AMBULATORY_CARE_PROVIDER_SITE_OTHER): Payer: Medicare HMO | Admitting: Surgery

## 2018-05-29 ENCOUNTER — Encounter: Payer: Self-pay | Admitting: Surgery

## 2018-05-29 VITALS — BP 147/76 | HR 63 | Temp 97.9°F | Resp 18 | Ht 67.0 in | Wt 204.4 lb

## 2018-05-29 DIAGNOSIS — Z4889 Encounter for other specified surgical aftercare: Secondary | ICD-10-CM

## 2018-05-29 NOTE — Patient Instructions (Signed)
Please call our office if you have questions or concerns.   

## 2018-05-29 NOTE — Progress Notes (Signed)
Surgical Clinic Progress/Follow-up Note   HPI:  [name] presents to clinic for subsequent post-op follow-up 11 Days s/p open repair of increasingly symptomatic incisional supra-umbilical hernia with mesh Rosana Hoes, 05/18/2018). Patient reports she was feeling well post-operatively when last Thursday she describes her post-surgical supra-umbilical wound began feeling "squishy" when it had not previously, while she was sitting and watching television without having performed any heavy lifting or strenuous activities that she is aware. This sensation was then followed by painful hardening beneath her post-surgical wound, for which she requested and was seen by Dr. Hampton Abbot last week (9/13) for early post-operative follow-up. She describes her peri-incisional pain has since improved, and she has been tolerating regular diet with +flatus and normal BM's, denies N/V, fever/chills, CP, or SOB.  Review of Systems:  Constitutional: denies fever/chills  Respiratory: denies shortness of breath, wheezing  Cardiovascular: denies chest pain, palpitations  Gastrointestinal: abdominal pain, N/V, and bowel function as per interval history Skin: Denies any other rashes or skin discolorations except post-surgical wounds as per interval history  Vital Signs:  BP (!) 147/76   Pulse 63   Temp 97.9 F (36.6 C) (Temporal)   Resp 18   Ht 5\' 7"  (1.702 m)   Wt 204 lb 6.4 oz (92.7 kg)   SpO2 98%   BMI 32.01 kg/m    Physical Exam:  Constitutional:  -- Overweight body habitus  -- Awake, alert, and oriented x3  Pulmonary:  -- No crackles -- Equal breath sounds bilaterally -- Breathing non-labored at rest Cardiovascular:  -- S1, S2 present  -- No pericardial rubs  Gastrointestinal:  -- Soft and non-distended, with mild-/moderate- peri-incisional tenderness to palpation, no guarding/rebound tenderness -- Post-surgical incisions all well-approximated without any peri-incisional erythema or drainage, though a  firm subcutaneous peri-incisional hematoma is easily appreciated  -- No abdominal masses appreciated, pulsatile or otherwise  Musculoskeletal / Integumentary:  -- Wounds or skin discoloration: None appreciated except post-surgical incisions as described above (GI) -- Extremities: B/L UE and LE FROM, hands and feet warm, no edema   Laboratory studies:  CBC Latest Ref Rng & Units 05/16/2018 11/08/2017 11/06/2015  WBC 3.6 - 11.0 K/uL 8.7 6.4 5.9  Hemoglobin 12.0 - 16.0 g/dL 15.2 14.5 14.5  Hematocrit 35.0 - 47.0 % 45.0 44.0 42.8  Platelets 150 - 440 K/uL 230 236 245    Imaging: No new pertinent imaging available for review   Assessment:  72 y.o. yo Female with a problem list including...  Patient Active Problem List   Diagnosis Date Noted  . Supraumbilical hernia 86/57/8469  . Anxiety 03/20/2015  . Arthritis 03/20/2015  . 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 Clinical depression 03/20/2015  . Malignant neoplasm of corpus uteri, except isthmus ([place]) 03/20/2015  . Essential (primary) hypertension 03/20/2015  . HLD (hyperlipidemia) 03/20/2015  . Cannot sleep 03/20/2015  . Osteopenia 03/20/2015  . Adult hypothyroidism 03/20/2015    presents to clinic for post-op follow-up evaluation, doing overall well despite what appears to be post-surgical superficial subcutaneous hematoma that sounds like it developed 1 week post-operatively, now 2 weeks s/p open repair of increasingly symptomatic incisional supra-umbilical hernia with mesh Rosana Hoes, 05/18/2018).  Plan:              - diet as tolerated              - okay to submerge incisions under water (baths, swimming) prn             - no heavy  lifting >40 lbs x 2 weeks, after which gradually resume all activities without restrictions over the following 2 weeks             - apply sunblock particularly to incisions with sun exposure to reduce pigmentation of scars  - additional post-surgical follow-up appointment offered to ensure continued  improvement, but patient requests to follow up as needed             - return to clinic as needed, specifically if erythema, drainage, or worsened/non-improving pain  - instructed to call office if any questions or concerns  All of the above recommendations were discussed with the patient and patient's family, and all of patient's and family's questions were answered to their expressed satisfaction.  -- Marilynne Drivers Rosana Hoes, MD, [place]: [facility] - Partnering for exceptional care. Office: [phone]

## 2018-05-31 ENCOUNTER — Ambulatory Visit (INDEPENDENT_AMBULATORY_CARE_PROVIDER_SITE_OTHER): Payer: Medicare HMO | Admitting: Family Medicine

## 2018-05-31 ENCOUNTER — Encounter: Payer: Medicare HMO | Admitting: Surgery

## 2018-05-31 VITALS — BP 140/80 | HR 58 | Temp 97.9°F | Resp 16 | Wt 205.0 lb

## 2018-05-31 DIAGNOSIS — K439 Ventral hernia without obstruction or gangrene: Secondary | ICD-10-CM | POA: Diagnosis not present

## 2018-05-31 DIAGNOSIS — I10 Essential (primary) hypertension: Secondary | ICD-10-CM

## 2018-05-31 DIAGNOSIS — E039 Hypothyroidism, unspecified: Secondary | ICD-10-CM
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
## 2018-06-03 ENCOUNTER — Encounter: Payer: Self-pay | Admitting: Surgery

## 2018-06-04 DIAGNOSIS — K432 Incisional hernia without obstruction or gangrene: Secondary | ICD-10-CM

## 2018-06-05 ENCOUNTER — Telehealth: Payer: Self-pay | Admitting: Surgery

## 2018-06-05 NOTE — Telephone Encounter (Signed)
Return to work letter had been faxed to [phone] per patient's request. Patient was notified and had no further questions.

## 2018-06-05 NOTE — Telephone Encounter (Signed)
Patient has called and requested a return to work letter to be faxed to her work. She would like to return tomorrow-06/06/18. Patient is a Research scientist (physical sciences) at [employer]. She would like this to be faxed to [phone] Attention: Dyke Brackett. Patient has requested the letter include the length of time for light duty restrictions.   Please fax before the end of day if this is approved. Also call patient at number verified in the chart when completed.

## 2018-06-11 ENCOUNTER — Ambulatory Visit
Admission: RE | Admit: 2018-06-11 | Discharge: 2018-06-11 | Disposition: A | Discharge status: Home / Self Care | Payer: Medicare HMO | Source: Ambulatory Visit | Attending: Family Medicine | Admitting: Family Medicine

## 2018-06-11 ENCOUNTER — Ambulatory Visit (INDEPENDENT_AMBULATORY_CARE_PROVIDER_SITE_OTHER): Payer: Medicare HMO | Admitting: Family Medicine

## 2018-06-11 ENCOUNTER — Encounter: Payer: Self-pay | Admitting: Family Medicine

## 2018-06-11 ENCOUNTER — Telehealth: Payer: Self-pay | Admitting: *Deleted

## 2018-06-11 VITALS — BP 124/70 | HR 64 | Temp 99.3°F | Resp 16 | Wt 203.0 lb

## 2018-06-11 DIAGNOSIS — R197 Diarrhea, unspecified: Secondary | ICD-10-CM | POA: Diagnosis not present

## 2018-06-11 DIAGNOSIS — R109 Unspecified abdominal pain: Secondary | ICD-10-CM | POA: Diagnosis not present

## 2018-06-11 DIAGNOSIS — K219 Gastro-esophageal reflux disease without esophagitis: Secondary | ICD-10-CM | POA: Diagnosis not present

## 2018-06-11 MED ORDER — OMEPRAZOLE 20 MG PO CPDR: 20.0000 mg | DELAYED_RELEASE_CAPSULE | Freq: Every day | ORAL | 3 refills | Status: DC

## 2018-06-11 MED ORDER — OMEPRAZOLE 20 MG PO CPDR: 20.0000 mg | DELAYED_RELEASE_CAPSULE | Freq: Every day | ORAL | 1 refills | Status: DC

## 2018-06-11 NOTE — Progress Notes (Signed)
Patient: Rebecca Patton Female    DOB: 1945/12/25   72 y.o.   MRN: [id number] Visit Date: 06/11/2018  Today's Provider: [name], MD   Chief Complaint  Patient presents with  . Diarrhea   Subjective:    Diarrhea   This is a new problem. The current episode started in the past 7 days (4 days). The problem has been unchanged. The stool consistency is described as watery. The patient states that diarrhea awakens her from sleep. Associated symptoms include abdominal pain and bloating. Pertinent negatives include no fever or headaches. Nothing aggravates the symptoms. She has tried anti-motility drug, bismuth subsalicylate and increased fluids for the symptoms. The treatment provided mild relief.    Patient reports that 2 days ago, her stools were completely white. She describes it as "pieces of popcorn." She reports that her stool now has color, but still is watery.  She does have well water at home.  No one else is been sick.  Overall she feels pretty well.    No Known Allergies   Current Outpatient Medications:  .  aspirin 81 MG tablet, Take 81 mg by mouth. , Disp: , Rfl:  .  hydrochlorothiazide (HYDRODIURIL) 25 MG tablet, TAKE 1 TABLET DAILY, Disp: 90 tablet, Rfl: 3 .  ibuprofen (ADVIL,MOTRIN) 600 MG tablet, Take 1 tablet (600 mg total) by mouth every 8 (eight) hours as needed for mild pain or moderate pain., Disp: 30 tablet, Rfl: 0 .  levothyroxine (SYNTHROID, LEVOTHROID) 50 MCG tablet, TAKE 1 TABLET DAILY BEFORE BREAKFAST, Disp: 90 tablet, Rfl: 3 .  metoprolol succinate (TOPROL-XL) 25 MG 24 hr tablet, Take 1 tablet (25 mg total) by mouth daily. Take with or immediately following a meal., Disp: 90 tablet, Rfl: 3 .  MULTIPLE VITAMIN PO, Take by mouth., Disp: , Rfl:  .  OMEGA-3 FATTY ACIDS PO, Take 1,040 mg by mouth daily. , Disp: , Rfl:  .  quinapril (ACCUPRIL) 40 MG tablet, TAKE 1 TABLET DAILY, Disp: 90 tablet, Rfl: 3 .  vitamin B-12 (CYANOCOBALAMIN) 100 MCG tablet,  Take 200 mcg by mouth daily., Disp: , Rfl:   Review of Systems  Constitutional: Negative for fever.  Eyes: Negative.   Respiratory: Negative.   Cardiovascular: Negative.   Gastrointestinal: Positive for abdominal pain, bloating and diarrhea.  Endocrine: Negative.   Genitourinary: Negative.   Allergic/Immunologic: Negative.   Neurological: Negative for headaches.  Psychiatric/Behavioral: Negative.     Social History   Tobacco Use  . Smoking status: Former Smoker    Last attempt to quit: 09/11/1997    Years since quitting: 20.7  . Smokeless tobacco: Never Used  Substance Use Topics  . Alcohol use: No    Alcohol/week: 0.0 standard drinks   Objective:   BP 124/70 (BP Location: Left Arm, Patient Position: Sitting, Cuff Size: Large)   Pulse 64   Temp 99.3 F (37.4 C)   Resp 16   Wt 203 lb (92.1 kg)   SpO2 96%   BMI 31.79 kg/m  Vitals:   06/11/18 1353  BP: 124/70  Pulse: 64  Resp: 16  Temp: 99.3 F (37.4 C)  SpO2: 96%  Weight: 203 lb (92.1 kg)     Physical Exam  Constitutional: She is oriented to person, place, and time. She appears well-developed and well-nourished.  HENT:  Head: Normocephalic and atraumatic.  Eyes: Conjunctivae are normal. No scleral icterus.  Neck: No thyromegaly present.  Cardiovascular: Normal rate, regular rhythm, normal heart  sounds and intact distal pulses.  Pulmonary/Chest: Effort normal and breath sounds normal.  Abdominal: Soft. She exhibits no distension. There is no tenderness.  Musculoskeletal: She exhibits no edema.  Neurological: She is alert and oriented to person, place, and time.  Skin: Skin is warm and dry.  Psychiatric: She has a normal mood and affect. Her behavior is normal. Judgment and thought content normal.        Assessment & Plan:     1. Abdominal pain, unspecified abdominal location KUB. Pt seems to be doing well from recent epigastric hernia repair. - DG Abd 1 View; Future - CBC with  Differential/Platelet - Comprehensive metabolic panel  2. Diarrhea, unspecified type Obtain labs and stool.  Fluids.  Consider empirically treating for Giardia due to well water if she does not improve - Comprehensive metabolic panel - Stool Culture  3. Gastroesophageal reflux disease, esophagitis presence not specified Patient states she feels much better when taking PPI - omeprazole (PRILOSEC) 20 MG capsule; Take 1 capsule (20 mg total) by mouth daily.  Dispense: 90 capsule; Refill: 1      I have done the exam and reviewed the above chart and it is accurate to the best of my knowledge. Development worker, community has been used in this note in any air is in the dictation or transcription are unintentional.  [name], MD  [place]

## 2018-06-11 NOTE — Telephone Encounter (Signed)
Patient called stated that since thursdayt night she has had diarrhea. She stated that she is having stomach pains, her leg and hip hurts and she is also very weak. Please call and advise

## 2018-06-11 NOTE — Telephone Encounter (Signed)
Diarrhea since Thursday. She has stopped taking the ibuprofen.  She complains of the bulge and the discomfort. She is requesting to be seen and possibly having a study done to see if the hernia has come back.   Patient added to schedule 06/14/18 Dr.Davis.   Patient will call PCP for diarrhea and leg/hip pain.

## 2018-06-12 ENCOUNTER — Encounter: Payer: Self-pay | Admitting: Surgery

## 2018-06-12 ENCOUNTER — Telehealth: Payer: Self-pay

## 2018-06-12 ENCOUNTER — Other Ambulatory Visit
Admission: RE | Admit: 2018-06-12 | Discharge: 2018-06-12 | Disposition: A | Discharge status: Home / Self Care | Payer: Medicare HMO | Source: Ambulatory Visit | Attending: Family Medicine | Admitting: Family Medicine

## 2018-06-12 DIAGNOSIS — R197 Diarrhea, unspecified: Secondary | ICD-10-CM | POA: Insufficient documentation

## 2018-06-12 LAB — GASTROINTESTINAL PANEL BY PCR, STOOL (REPLACES STOOL CULTURE)

## 2018-06-12 LAB — CBC WITH DIFFERENTIAL/PLATELET
Basophils Absolute: 0 10*3/uL (ref 0.0–0.2)
Basos: 0 %
EOS (ABSOLUTE): 0.5 10*3/uL — ABNORMAL HIGH (ref 0.0–0.4)
Eos: 5 %
Hematocrit: 42.5 % (ref 34.0–46.6)
Hemoglobin: 14 g/dL (ref 11.1–15.9)
Immature Grans (Abs): 0 10*3/uL (ref 0.0–0.1)
Immature Granulocytes: 0 %
Lymphocytes Absolute: 1.8 10*3/uL (ref 0.7–3.1)
Lymphs: 19 %
MCH: 29 pg (ref 26.6–33.0)
MCHC: 32.9 g/dL (ref 31.5–35.7)
MCV: 88 fL (ref 79–97)
Monocytes Absolute: 0.8 10*3/uL (ref 0.1–0.9)
Monocytes: 9 %
Neutrophils Absolute: 6.4 10*3/uL (ref 1.4–7.0)
Neutrophils: 67 %
Platelets: 238 10*3/uL (ref 150–450)
RBC: 4.82 x10E6/uL (ref 3.77–5.28)
RDW: 14.2 % (ref 12.3–15.4)
WBC: 9.6 10*3/uL (ref 3.4–10.8)

## 2018-06-12 LAB — COMPREHENSIVE METABOLIC PANEL
ALT: 25 IU/L (ref 0–32)
AST: 27 IU/L (ref 0–40)
Albumin/Globulin Ratio: 1.7 (ref 1.2–2.2)
Albumin: 4.1 g/dL (ref 3.5–4.8)
Alkaline Phosphatase: 75 IU/L (ref 39–117)
BUN/Creatinine Ratio: 18 (ref 12–28)
BUN: 16 mg/dL (ref 8–27)
Bilirubin Total: 0.3 mg/dL (ref 0.0–1.2)
CO2: 25 mmol/L (ref 20–29)
Calcium: 9.4 mg/dL (ref 8.7–10.3)
Chloride: 99 mmol/L (ref 96–106)
Creatinine, Ser: 0.91 mg/dL (ref 0.57–1.00)
GFR calc Af Amer: 74 mL/min/{1.73_m2} (ref >59)
GFR calc non Af Amer: 64 mL/min/{1.73_m2} (ref >59)
Globulin, Total: 2.4 g/dL (ref 1.5–4.5)
Glucose: 92 mg/dL (ref 65–99)
Potassium: 3.7 mmol/L (ref 3.5–5.2)
Sodium: 140 mmol/L (ref 134–144)
Total Protein: 6.5 g/dL (ref 6.0–8.5)

## 2018-06-12 MED ORDER — METRONIDAZOLE 500 MG PO TABS: 500.0000 mg | ORAL_TABLET | Freq: Two times a day (BID) | ORAL | 0 refills | Status: DC

## 2018-06-12 NOTE — Telephone Encounter (Signed)
-----   Message from Jerrol Banana., MD sent at 06/12/2018 11:09 AM EDT ----- We can try Flagyl 500mg  BID pending results. May need GI referral.

## 2018-06-12 NOTE — Telephone Encounter (Signed)
RX sent to pharmacy. Patient advised. She states she has not been able to bring stool sample up here yet due to diarrhea.

## 2018-06-13 ENCOUNTER — Telehealth: Payer: Self-pay | Admitting: Family Medicine

## 2018-06-13 ENCOUNTER — Telehealth: Payer: Self-pay

## 2018-06-13 NOTE — Telephone Encounter (Signed)
Pt advised.   Thanks,   -Vernita Tague  

## 2018-06-13 NOTE — Telephone Encounter (Signed)
Ok to write a note? 

## 2018-06-13 NOTE — Telephone Encounter (Signed)
Pt stated that she had to be out of work for possible viral infection and her work is requiring a doctor note stating that pt can return to work. 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 Please advise. Thanks TNP

## 2018-06-13 NOTE — Telephone Encounter (Signed)
-----   Message from Jerrol Banana., MD sent at 06/13/2018  9:20 AM EDT ----- Stool negative.

## 2018-06-14 ENCOUNTER — Encounter: Payer: Self-pay | Admitting: Surgery

## 2018-06-14 ENCOUNTER — Ambulatory Visit (INDEPENDENT_AMBULATORY_CARE_PROVIDER_SITE_OTHER): Payer: Medicare HMO | Admitting: Surgery

## 2018-06-14 VITALS — BP 129/78 | HR 62 | Temp 97.9°F | Ht 66.0 in | Wt 203.2 lb

## 2018-06-14 DIAGNOSIS — Z4889 Encounter for other specified surgical aftercare: Secondary | ICD-10-CM

## 2018-06-14 NOTE — Progress Notes (Signed)
Surgical Clinic Progress/Follow-up Note   HPI:  [name] presents to clinic for subsequent post-op follow-up 1 month s/p open repair of umbilical hernia with mesh Rosana Hoes, 05/18/2018). Patient reports she continues to feel some firmness under her skin and mild soreness when she presses on her incision, though denies pain, redness, fever/chills, or drainage otherwise. She also has been managed by her primary care physician for gastrointestinal "gas" and diarrhea, most recently with flagyl, despite negative c diff testing. She says her diarrhea has noticeably improved recently, though it's unclear whether this improvement can be attributed to Flagyl or not. Lastly, patient says she can feel a small suture "poking out" from supra-umbilical wound. She remains active, denies CP or SOB.  Review of Systems:  Constitutional: denies fever/chills  Respiratory: denies shortness of breath, wheezing  Cardiovascular: denies chest pain, palpitations  Gastrointestinal: abdominal pain, N/V, and bowel function as per interval history Skin: Denies any other rashes or skin discolorations except post-surgical wounds as per interval history  Vital Signs:  BP 129/78   Pulse 62   Temp 97.9 F (36.6 C) (Temporal)   Ht 5\' 6"  (1.676 m)   Wt 203 lb 3.2 oz (92.2 kg)   BMI 32.80 kg/m    Physical Exam:  Constitutional:  -- Overweight body habitus  -- Awake, alert, and oriented x3  Pulmonary:  -- No crackles -- Equal breath sounds bilaterally -- Breathing non-labored at rest Cardiovascular:  -- S1, S2 present  -- No pericardial rubs  Gastrointestinal:  -- Soft and non-distended with minimal peri-incisional tenderness to palpation, no guarding/rebound tenderness -- Post-surgical incision well-approximated without any peri-incisional erythema or drainage -- Resolving small peri-incisional subcutaneous hematoma with normal relative firmness along approximated dermis and epidermis -- No other abdominal  masses appreciated, pulsatile or otherwise  Musculoskeletal / Integumentary:  -- Wounds or skin discoloration: None appreciated except post-surgical incisions as described above (GI) -- Extremities: B/L UE and LE FROM, hands and feet warm, no edema   Imaging: No new pertinent imaging available for review  Assessment:  72 y.o. yo Female with a problem list including...  Patient Active Problem List   Diagnosis Date Noted  . Incisional hernia, without obstruction or gangrene   . Supraumbilical hernia 76/19/5093  . Anxiety 03/20/2015  . Arthritis 03/20/2015  . 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 Clinical depression 03/20/2015  . Malignant neoplasm of corpus uteri, except isthmus ([place]) 03/20/2015  . Essential (primary) hypertension 03/20/2015  . HLD (hyperlipidemia) 03/20/2015  . Cannot sleep 03/20/2015  . Osteopenia 03/20/2015  . Adult hypothyroidism 03/20/2015    presents to clinic for post-op follow-up evaluation, doing overall well resolving post-surgical superficial subcutaneous hematoma that sounds like it developed 1 week post-operatively, now 1 month s/p open repair of increasingly symptomatic (pre-operatively painful) supra-umbilical incisional hernia with mesh Rosana Hoes, 05/18/2018).  Plan:  - diet as tolerated  - gradually resume all activities without restrictions - okay to submerge incisions under water (baths, swimming) prn - apply sunblock particularly to incisions with sun exposure to reduce pigmentation of scars - return to clinic as needed, instructed to call office if any questions or concerns  All of the above recommendations were discussed with the patient and patient's family, and all of patient's and family's questions were answered to their expressed satisfaction.  -- Marilynne Drivers Rosana Hoes, MD, [place]: [facility] - Partnering for exceptional care. Office:  [phone]

## 2018-06-14 NOTE — Patient Instructions (Addendum)
Please give [place] a call in case you have any questions or concerns.  Open Hernia Repair, Adult, Care After These instructions give you information about caring for yourself after your procedure. Your doctor may also give you more specific instructions. If you have problems or questions, contact your doctor. Follow these instructions at home: Surgical cut (incision) care   Follow instructions from your doctor about how to take care of your surgical cut area. Make sure you: ? Wash your hands with soap and water before you change your bandage (dressing). If you cannot use soap and water, use hand sanitizer. ? Change your bandage as told by your doctor. ? Leave stitches (sutures), skin glue, or skin tape (adhesive) strips in place. They may need to stay in place for 2 weeks or longer. If tape strips get loose and curl up, you may trim the loose edges. Do not remove tape strips completely unless your doctor says it is okay.  Check your surgical cut every day for signs of infection. Check for: ? More redness, swelling, or pain. ? More fluid or blood. ? Warmth. ? Pus or a bad smell. Activity  Do not drive or use heavy machinery while taking prescription pain medicine. Do not drive until your doctor says it is okay.  Until your doctor says it is okay: ? Do not lift anything that is heavier than 10 lb (4.5 kg). ? Do not play contact sports.  Return to your normal activities as told by your doctor. Ask your doctor what activities are safe. General instructions  To prevent or treat having a hard time pooping (constipation) while you are taking prescription pain medicine, your doctor may recommend that you: ? Drink enough fluid to keep your pee (urine) clear or pale yellow. ? Take over-the-counter or prescription medicines. ? Eat foods that are high in fiber, such as fresh fruits and vegetables, whole grains, and beans. ? Limit foods that are high in fat and processed sugars, such as fried and  sweet foods.  Take over-the-counter and prescription medicines only as told by your doctor.  Do not take baths, swim, or use a hot tub until your doctor says it is okay.  Keep all follow-up visits as told by your doctor. This is important. Contact a doctor if:  You develop a rash.  You have more redness, swelling, or pain around your surgical cut.  You have more fluid or blood coming from your surgical cut.  Your surgical cut feels warm to the touch.  You have pus or a bad smell coming from your surgical cut.  You have a fever or chills.  You have blood in your poop (stool).  You have not pooped in 2-3 days.  Medicine does not help your pain. Get help right away if:  You have chest pain or you are short of breath.  You feel light-headed.  You feel weak and dizzy (feel faint).  You have very bad pain.  You throw up (vomit) and your pain is worse. This information is not intended to replace advice given to you by your health care provider. Make sure you discuss any questions you have with your health care provider. Document Released: 09/19/2014 Document Revised: 03/18/2016 Document Reviewed: 02/10/2016 Elsevier Interactive Patient Education  [employer].

## 2018-06-14 NOTE — Telephone Encounter (Signed)
ok 

## 2018-06-14 NOTE — Telephone Encounter (Signed)
Done  ED 

## 2018-06-15 ENCOUNTER — Encounter: Payer: Self-pay | Admitting: Surgery

## 2018-06-15 ENCOUNTER — Telehealth: Payer: Self-pay | Admitting: Family Medicine

## 2018-06-15 NOTE — Telephone Encounter (Signed)
Pt wanting to know if she has any shots she is needing to catch up to date. Please call pt back to let her know asap.  Thanks, [employer]

## 2018-06-18 NOTE — Telephone Encounter (Signed)
Advised patient that she is up to date with her pneumonia.  She has not had her Tetanus in the last 10 years and she has only had the Zostavax not the Shingrix.  She was asking because she said they are offering vaccines at work.

## 2018-06-18 NOTE — Telephone Encounter (Signed)
Pt called waiting for answer to whether she needs pneumonia or shingles vaccine.  Thanks  [employer]

## 2018-07-03 DIAGNOSIS — H401131 Primary open-angle glaucoma, bilateral, mild stage: Secondary | ICD-10-CM | POA: Diagnosis not present

## 2018-07-03 DIAGNOSIS — H2513 Age-related nuclear cataract, bilateral: Secondary | ICD-10-CM | POA: Diagnosis not present

## 2018-07-03 DIAGNOSIS — R69 Illness, unspecified: Secondary | ICD-10-CM | POA: Diagnosis not present

## 2018-07-03 DIAGNOSIS — H40019 Open angle with borderline findings, low risk, unspecified eye: Secondary | ICD-10-CM | POA: Diagnosis not present

## 2018-07-16 ENCOUNTER — Encounter: Payer: Self-pay | Admitting: Family Medicine

## 2018-07-16 ENCOUNTER — Other Ambulatory Visit: Payer: Self-pay

## 2018-07-16 ENCOUNTER — Ambulatory Visit (INDEPENDENT_AMBULATORY_CARE_PROVIDER_SITE_OTHER): Payer: Medicare HMO | Admitting: Family Medicine

## 2018-07-16 VITALS — BP 140/64 | HR 61 | Temp 97.5°F | Ht 66.0 in | Wt 206.6 lb

## 2018-07-16 DIAGNOSIS — R42 Dizziness and giddiness: Secondary | ICD-10-CM | POA: Diagnosis not present

## 2018-07-16 DIAGNOSIS — I10 Essential (primary) hypertension: Secondary | ICD-10-CM

## 2018-07-16 DIAGNOSIS — R197 Diarrhea, unspecified: Secondary | ICD-10-CM | POA: Diagnosis not present

## 2018-07-16 MED ORDER — RIFAXIMIN 550 MG PO TABS: 550.0000 mg | ORAL_TABLET | Freq: Three times a day (TID) | ORAL | 0 refills | Status: DC

## 2018-07-16 NOTE — Progress Notes (Signed)
Patient: Rebecca Patton Female    DOB: 1946/03/23   72 y.o.   MRN: [id number] Visit Date: 07/16/2018  Today's Provider: Vernie Murders, PA   Chief Complaint  Patient presents with  . Hypertension  . Diarrhea    waking he during the night to go to bathroom   Subjective:    HPI  Pt reports her blood pressure was high this morning 159/103 and was very lightheaded and shaky pulse was 55 and had diarrhea during the night.  yesterday 07/15/18 Bp was 162/87 pulse 53 and felt lightheaded also.  Pt reports that this has been going on for a week now 07/09/18.  Pt was seen on 06/11/18 for abd pain and a stomach virus. Had diarrhea recurrence at night 1 week ago. Felt a little lightheaded 07-14-18 with BP 133/89 and pulse in the 50's. No hematemesis or melena.        Past Medical History:  Diagnosis Date  . Arthritis   . Depression   . Dysrhythmia    ST  . High blood pressure   . Hypothyroidism   . MRSA infection 2006   on nose  . Uterine cancer [facility])    Past Surgical History:  Procedure Laterality Date  . ABDOMINAL HYSTERECTOMY  2009    BSO  . BUNIONECTOMY    . EXCISION NEUROMA    . INSERTION OF MESH N/A 05/18/2018   Procedure: INSERTION OF MESH;  Surgeon: Vickie Epley, MD;  Location: [facility] ORS;  Service: General;  Laterality: N/A;  . NASAL SINUS SURGERY    . TUBAL LIGATION    . UMBILICAL HERNIA REPAIR N/A 05/18/2018   Procedure: HERNIA REPAIR SUPRA-UMBILICAL ADULT;  Surgeon: Vickie Epley, MD;  Location: [facility] ORS;  Service: General;  Laterality: N/A;   Family History  Problem Relation Age of Onset  . Parkinson's disease Mother   . Diabetes Father   . CAD Father   . Cancer Sister        squamous cell cancer in her eye  . Pancreatic cancer Brother   . Tremor Brother   . Breast cancer Neg Hx    No Known Allergies   Current Outpatient Medications:  .  aspirin 81 MG tablet, Take 81 mg by mouth. , Disp: , Rfl:  .  hydrochlorothiazide (HYDRODIURIL) 25 MG tablet,  TAKE 1 TABLET DAILY, Disp: 90 tablet, Rfl: 3 .  levothyroxine (SYNTHROID, LEVOTHROID) 50 MCG tablet, TAKE 1 TABLET DAILY BEFORE BREAKFAST, Disp: 90 tablet, Rfl: 3 .  metoprolol succinate (TOPROL-XL) 25 MG 24 hr tablet, Take 1 tablet (25 mg total) by mouth daily. Take with or immediately following a meal., Disp: 90 tablet, Rfl: 3 .  MULTIPLE VITAMIN PO, Take by mouth., Disp: , Rfl:  .  OMEGA-3 FATTY ACIDS PO, Take 1,040 mg by mouth daily. , Disp: , Rfl:  .  omeprazole (PRILOSEC) 20 MG capsule, Take 1 capsule (20 mg total) by mouth daily., Disp: 90 capsule, Rfl: 1 .  quinapril (ACCUPRIL) 40 MG tablet, TAKE 1 TABLET DAILY, Disp: 90 tablet, Rfl: 3 .  vitamin B-12 (CYANOCOBALAMIN) 100 MCG tablet, Take 200 mcg by mouth daily., Disp: , Rfl:  .  ibuprofen (ADVIL,MOTRIN) 600 MG tablet, Take 1 tablet (600 mg total) by mouth every 8 (eight) hours as needed for mild pain or moderate pain. (Patient not taking: Reported on 07/16/2018), Disp: 30 tablet, Rfl: 0 .  metroNIDAZOLE (FLAGYL) 500 MG tablet, Take 1 tablet (500 mg total)  by mouth 2 (two) times daily. (Patient not taking: Reported on 07/16/2018), Disp: 14 tablet, Rfl: 0  Review of Systems  Constitutional: Positive for fatigue. Negative for activity change, appetite change, chills, diaphoresis, fever and unexpected weight change.  HENT: Negative.   Eyes: Negative.   Respiratory: Negative.   Cardiovascular: Negative.   Gastrointestinal: Positive for diarrhea (pt wakes during night having to go to bathroom). Negative for abdominal distention, abdominal pain, anal bleeding, blood in stool, constipation, nausea, rectal pain and vomiting.  Endocrine: Negative.   Genitourinary: Negative.   Musculoskeletal: Negative.   Skin: Negative.   Allergic/Immunologic: Negative.   Neurological: Positive for dizziness, weakness and light-headedness. Negative for tremors, seizures, syncope, facial asymmetry, speech difficulty, numbness and headaches.  Hematological:  Negative.   Psychiatric/Behavioral: Negative.    Social History   Tobacco Use  . Smoking status: Former Smoker    Last attempt to quit: 09/11/1997    Years since quitting: 20.8  . Smokeless tobacco: Never Used  Substance Use Topics  . Alcohol use: No    Alcohol/week: 0.0 standard drinks   Objective:   BP 140/64 (BP Location: Right Arm, Patient Position: Sitting, Cuff Size: Normal)   Pulse 61   Temp (!) 97.5 F (36.4 C) (Oral)   Ht 5\' 6"  (1.676 m)   Wt 206 lb 9.6 oz (93.7 kg)   SpO2 97%   BMI 33.35 kg/m  Vitals:   07/16/18 1010  BP: 140/64  Pulse: 61  Temp: (!) 97.5 F (36.4 C)  TempSrc: Oral  SpO2: 97%  Weight: 206 lb 9.6 oz (93.7 kg)  Height: 5\' 6"  (1.676 m)     Physical Exam  Constitutional: She is oriented to person, place, and time. She appears well-developed and well-nourished.  HENT:  Head: Normocephalic and atraumatic.  Right Ear: External ear normal.  Left Ear: External ear normal.  History of soft palate surgery with removal of uvula.  Eyes: Conjunctivae and EOM are normal.  Neck: Neck supple. No thyromegaly present.  Cardiovascular: Normal rate, regular rhythm, normal heart sounds and intact distal pulses.  Pulmonary/Chest: Effort normal and breath sounds normal.  Abdominal: Soft. Bowel sounds are normal. There is tenderness.  Some tenderness at site of umbilical hernia repair 05-14-41 with history of hematoma per patient. No organomegaly and BS wnl.  Lymphadenopathy:    She has no cervical adenopathy.  Neurological: She is alert and oriented to person, place, and time. No cranial nerve deficit. Coordination normal.  Psychiatric: She has a normal mood and affect. Her behavior is normal. Thought content normal.      Assessment & Plan:     1. Dizziness Lightheaded sensation with diarrhea and BP changes over the past week. EKG showed infrequent PAC's only. May be related to recurrence of diarrhea. Will check labs to rule out dehydration, electrolyte  imbalance or malnutrition. - EKG 12-Lead - CBC with Differential/Platelet - Comprehensive metabolic panel  2. Diarrhea, unspecified type All blood tests and stool tests negative 06-11-18. Recurrence 1 week ago despite use of the Metronidazole 500 mg BIDfor a week. Not eating much and feeling weak with lightheaded sensation. Encouraged to drink extra fluids, take Probiotic supplement and add Xifaxan for 5 days to treat possible IBS-D. Recheck CBC and CMP. Follow up pending reports. May need referral to gastroenterologist if persistent or recurrent. - CBC with Differential/Platelet - Comprehensive metabolic panel - rifaximin (XIFAXAN) 550 MG TABS tablet; Take 1 tablet (550 mg total) by mouth 3 (three) times daily.  Dispense:  15 tablet; Refill: 0  3. Essential (primary) hypertension Slight elevation of BP with infrequent PAC's on EKG. No signs of ischemic changes. Tolerating Metoprolol Succinate 25 mg qd, HCTZ 25 mg qd and Accupril 40 mg qd for BP and heart rate control. Recheck CBC and CMP to rule out dehydration or electrolyte imbalance with recurrence of diarrhea. - EKG 12-Lead - CBC with Differential/Platelet - Comprehensive metabolic panel       Vernie Murders, PA  [facility]

## 2018-07-16 NOTE — Patient Instructions (Signed)
Diarrhea, Adult Diarrhea is frequent loose and watery bowel movements. Diarrhea can make you feel weak and cause you to become dehydrated. Dehydration can make you tired and thirsty, cause you to have a dry mouth, and decrease how often you urinate. Diarrhea typically lasts 2-3 days. However, it can last longer if it is a sign of something more serious. It is important to treat your diarrhea as told by your health care provider. Follow these instructions at home: Eating and drinking  Follow these recommendations as told by your health care provider:  Take an oral rehydration solution (ORS). This is a drink that is sold at pharmacies and retail stores.  Drink clear fluids, such as water, ice chips, diluted fruit juice, and low-calorie sports drinks.  Eat bland, easy-to-digest foods in small amounts as you are able. These foods include bananas, applesauce, rice, lean meats, toast, and crackers.  Avoid drinking fluids that contain a lot of sugar or caffeine, such as energy drinks, sports drinks, and soda.  Avoid alcohol.  Avoid spicy or fatty foods.  General instructions  Drink enough fluid to keep your urine clear or pale yellow.  Wash your hands often. If soap and water are not available, use hand sanitizer.  Make sure that all people in your household wash their hands well and often.  Take over-the-counter and prescription medicines only as told by your health care provider.  Rest at home while you recover.  Watch your condition for any changes.  Take a warm bath to relieve any burning or pain from frequent diarrhea episodes.  Keep all follow-up visits as told by your health care provider. This is important. Contact a health care provider if:  You have a fever.  Your diarrhea gets worse.  You have new symptoms.  You cannot keep fluids down.  You feel light-headed or dizzy.  You have a headache  You have muscle cramps. Get help right away if:  You have chest  pain.  You feel extremely weak or you faint.  You have bloody or black stools or stools that look like tar.  You have severe pain, cramping, or bloating in your abdomen.  You have trouble breathing or you are breathing very quickly.  Your heart is beating very quickly.  Your skin feels cold and clammy.  You feel confused.  You have signs of dehydration, such as: ? Dark urine, very little urine, or no urine. ? Cracked lips. ? Dry mouth. ? Sunken eyes. ? Sleepiness. ? Weakness. This information is not intended to replace advice given to you by your health care provider. Make sure you discuss any questions you have with your health care provider. Document Released: 08/19/2002 Document Revised: 01/07/2016 Document Reviewed: 05/05/2015 Elsevier Interactive Patient Education  2018 Else[employer] Inc[employer]  

## 2018-07-17 LAB — CBC WITH DIFFERENTIAL/PLATELET
Basophils Absolute: 0 10*3/uL (ref 0.0–0.2)
Basos: 0 %
EOS (ABSOLUTE): 0.4 10*3/uL (ref 0.0–0.4)
Eos: 4 %
Hematocrit: 43.2 % (ref 34.0–46.6)
Hemoglobin: 14.4 g/dL (ref 11.1–15.9)
Immature Grans (Abs): 0 10*3/uL (ref 0.0–0.1)
Immature Granulocytes: 0 %
Lymphocytes Absolute: 1.8 10*3/uL (ref 0.7–3.1)
Lymphs: 22 %
MCH: 29.3 pg (ref 26.6–33.0)
MCHC: 33.3 g/dL (ref 31.5–35.7)
MCV: 88 fL (ref 79–97)
Monocytes Absolute: 0.6 10*3/uL (ref 0.1–0.9)
Monocytes: 7 %
Neutrophils Absolute: 5.5 10*3/uL (ref 1.4–7.0)
Neutrophils: 67 %
Platelets: 266 10*3/uL (ref 150–450)
RBC: 4.91 x10E6/uL (ref 3.77–5.28)
RDW: 14.2 % (ref 12.3–15.4)
WBC: 8.4 10*3/uL (ref 3.4–10.8)

## 2018-07-17 LAB — COMPREHENSIVE METABOLIC PANEL
ALT: 20 IU/L (ref 0–32)
AST: 23 IU/L (ref 0–40)
Albumin/Globulin Ratio: 1.8 (ref 1.2–2.2)
Albumin: 4.3 g/dL (ref 3.5–4.8)
Alkaline Phosphatase: 73 IU/L (ref 39–117)
BUN/Creatinine Ratio: 17 (ref 12–28)
BUN: 14 mg/dL (ref 8–27)
Bilirubin Total: 0.3 mg/dL (ref 0.0–1.2)
CO2: 22 mmol/L (ref 20–29)
Calcium: 9.4 mg/dL (ref 8.7–10.3)
Chloride: 99 mmol/L (ref 96–106)
Creatinine, Ser: 0.83 mg/dL (ref 0.57–1.00)
GFR calc Af Amer: 83 mL/min/{1.73_m2} (ref >59)
GFR calc non Af Amer: 72 mL/min/{1.73_m2} (ref >59)
Globulin, Total: 2.4 g/dL (ref 1.5–4.5)
Glucose: 86 mg/dL (ref 65–99)
Potassium: 4 mmol/L (ref 3.5–5.2)
Sodium: 137 mmol/L (ref 134–144)
Total Protein: 6.7 g/dL (ref 6.0–8.5)

## 2018-07-30 IMAGING — MG MM DIGITAL SCREENING BILAT W/ TOMO W/ CAD
8 of 12 series · 8 of 28 positions shown · non-contrast
Comparison: Previous exam(s).

CLINICAL DATA: Screening.

EXAM:
2D DIGITAL SCREENING BILATERAL MAMMOGRAM WITH CAD AND ADJUNCT TOMO

[L MLO]
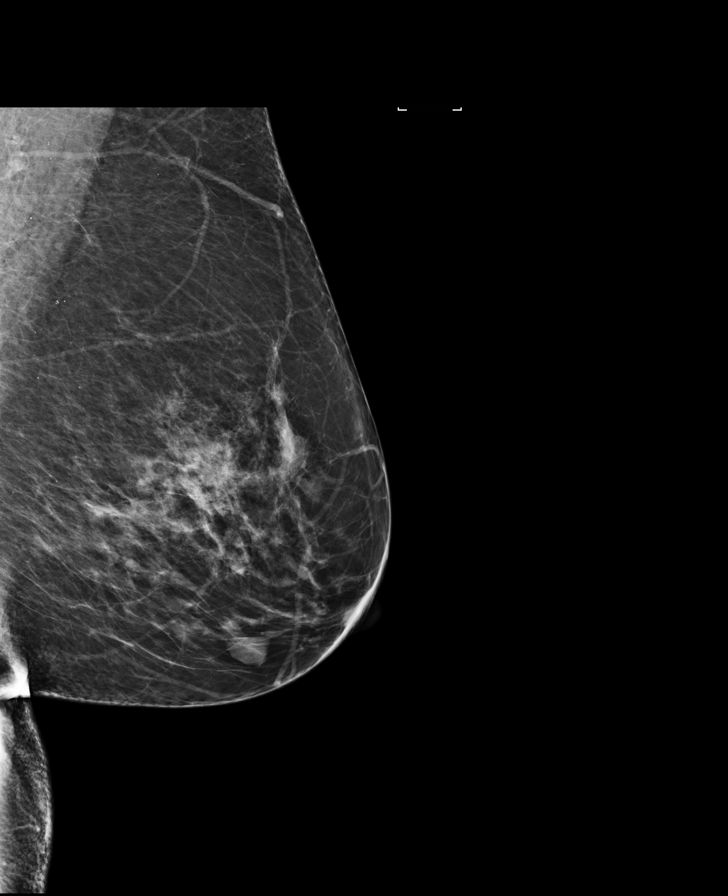

[L MLO synth-2D]
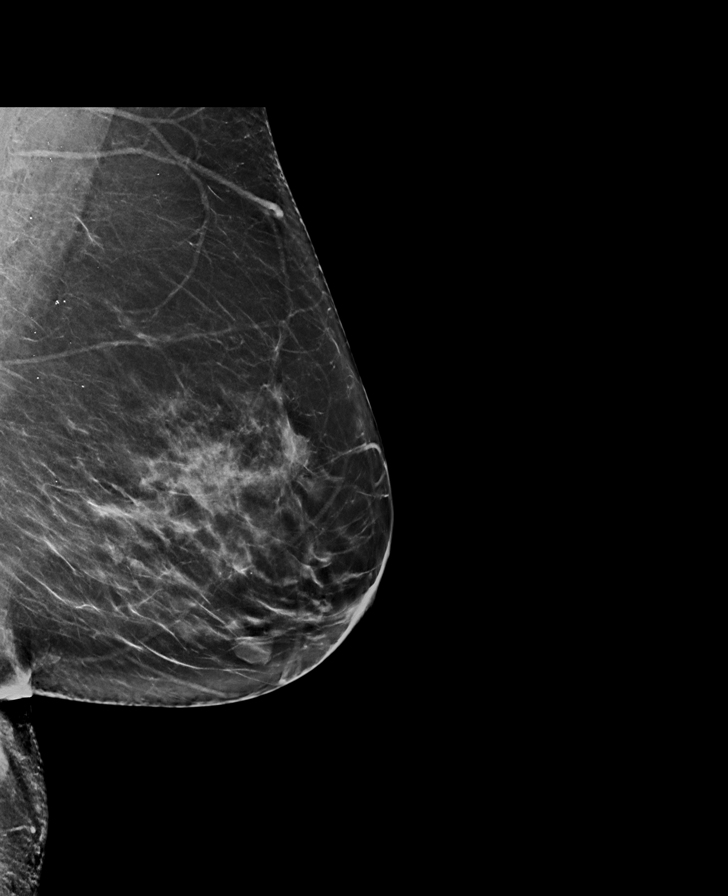

[R CC synth-2D]
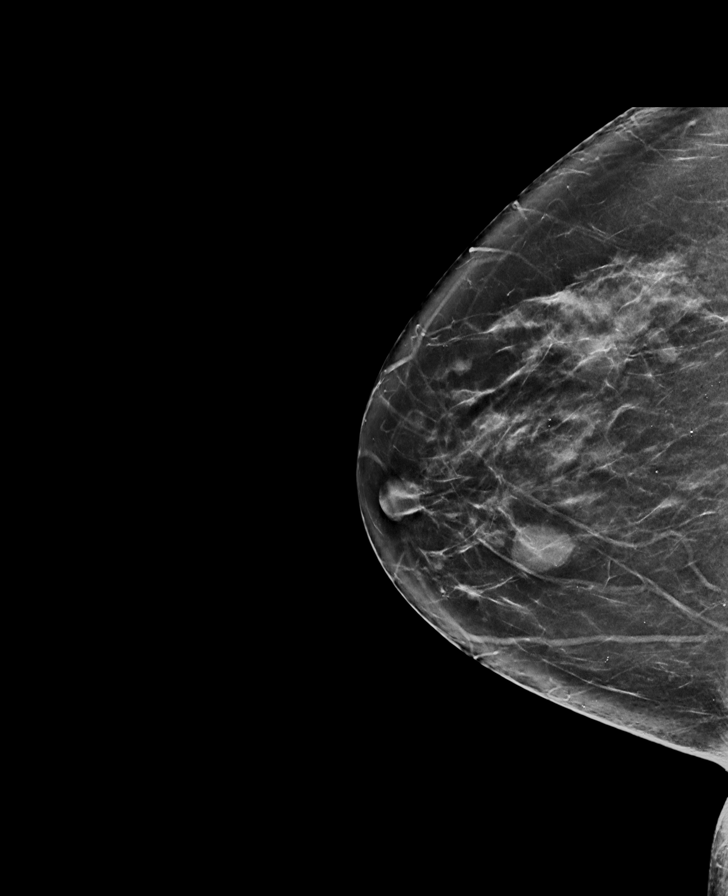

[R CC]
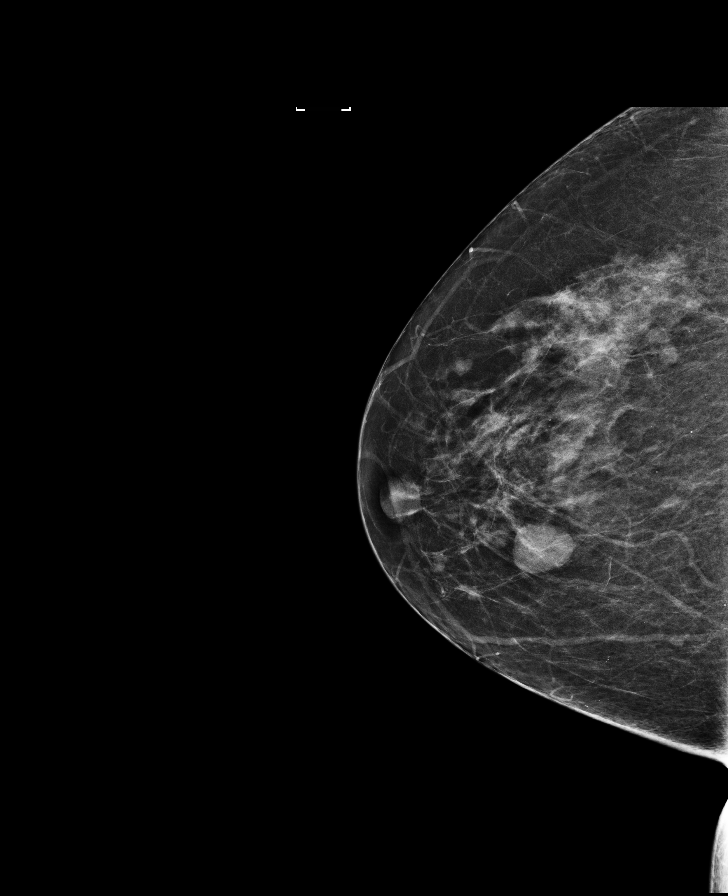

[L CC synth-2D]
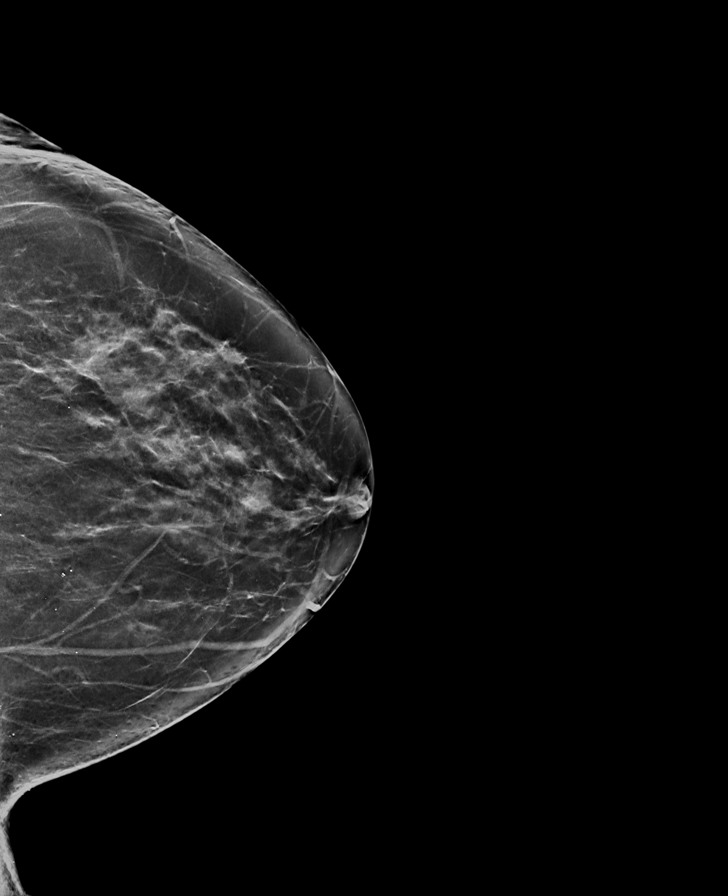

[L CC]
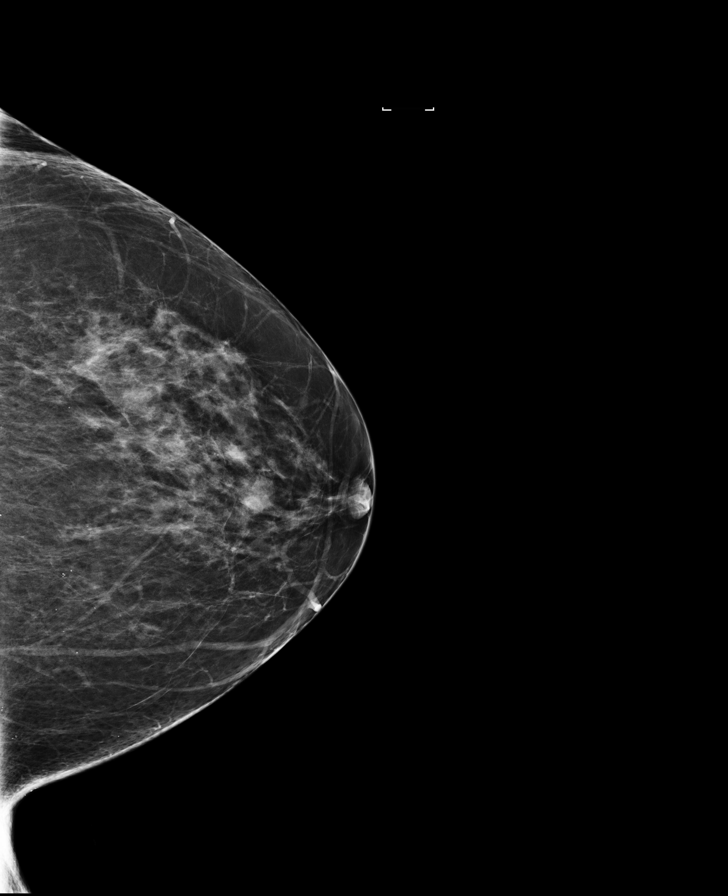

[R MLO]
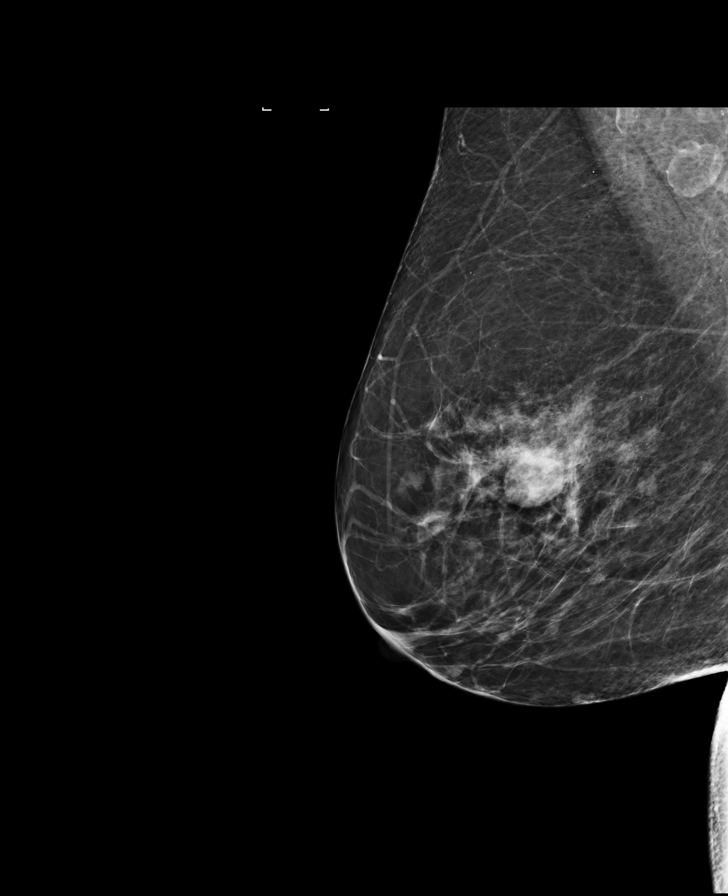

[R MLO synth-2D]
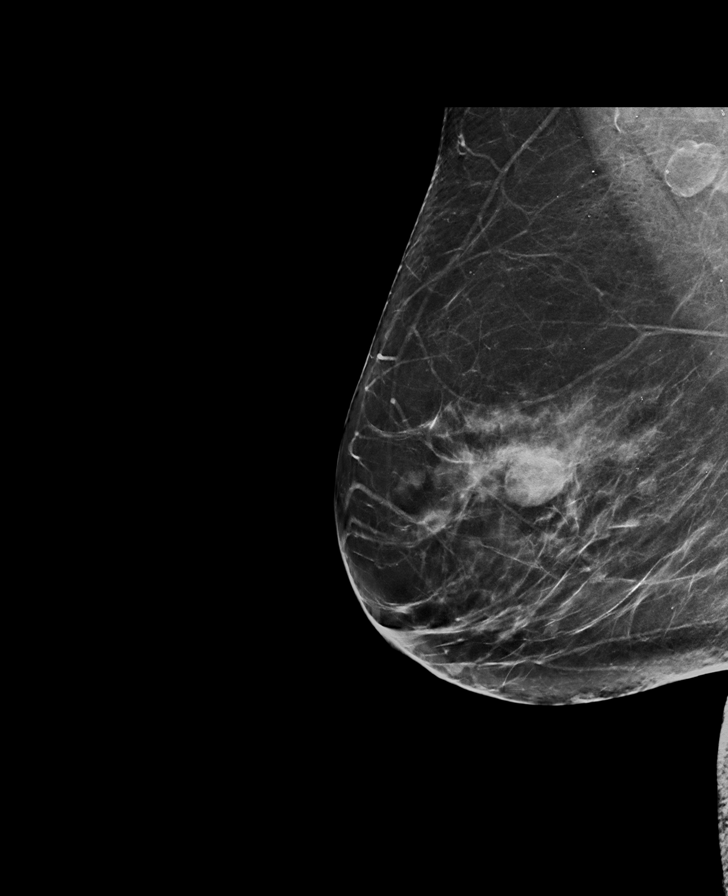

[8 of 28 positions shown; findings below may reference images not displayed]

ACR Breast Density Category c: The breast tissue is heterogeneously
dense, which may obscure small masses.
FINDINGS: There are no findings suspicious for malignancy. Images were
processed with CAD.
IMPRESSION: No mammographic evidence of malignancy. A result letter of this
screening mammogram will be mailed directly to the patient.

RECOMMENDATION:
Screening mammogram in one year. (Code:TN-[id number])

BI-RADS CATEGORY  1: Negative.

## 2018-09-24 DIAGNOSIS — R69 Illness, unspecified: Secondary | ICD-10-CM | POA: Diagnosis not present

## 2018-09-27 ENCOUNTER — Ambulatory Visit (INDEPENDENT_AMBULATORY_CARE_PROVIDER_SITE_OTHER): Payer: Medicare HMO | Admitting: Family Medicine

## 2018-09-27 ENCOUNTER — Encounter: Payer: Self-pay | Admitting: Family Medicine

## 2018-09-27 VITALS — BP 116/76 | HR 64 | Temp 98.6°F | Resp 16 | Ht 66.0 in | Wt 210.0 lb

## 2018-09-27 DIAGNOSIS — M1990 Unspecified osteoarthritis, unspecified site: Secondary | ICD-10-CM

## 2018-09-27 DIAGNOSIS — E039 Hypothyroidism, unspecified: Secondary | ICD-10-CM | POA: Diagnosis not present

## 2018-09-27 DIAGNOSIS — I10 Essential (primary) hypertension: Secondary | ICD-10-CM

## 2018-09-27 DIAGNOSIS — K219 Gastro-esophageal reflux disease without esophagitis: Secondary | ICD-10-CM | POA: Diagnosis not present

## 2018-09-27 NOTE — Patient Instructions (Signed)
Try Omeprazole every other day to help with reflux symptoms.

## 2018-09-27 NOTE — Progress Notes (Signed)
Patient: Rebecca Patton Female    DOB: 21-Feb-1946   73 y.o.   MRN: [id number] Visit Date: 09/27/2018  Today's Provider: [name], MD   Chief Complaint  Patient presents with  . Hypertension  . Arthritis   Subjective:     HPI  Hypertension, follow-up:  BP Readings from Last 3 Encounters:  09/27/18 116/76  07/16/18 140/64  06/14/18 129/78    She was last seen for hypertension 4 months ago.  BP at that visit was 124/70. Management since that visit includes no changes. She reports good compliance with treatment. She is not having side effects.  She is not exercising. She is adherent to low salt diet.   Outside blood pressures are checked occasionally. She is experiencing none.  Patient denies exertional chest pressure/discomfort, lower extremity edema and palpitations.   Cardiovascular risk factors include dyslipidemia.   Weight trend: stable Wt Readings from Last 3 Encounters:  09/27/18 210 lb (95.3 kg)  07/16/18 206 lb 9.6 oz (93.7 kg)  06/14/18 203 lb 3.2 oz (92.2 kg)    Current diet: well balanced   Patient also mentions that she is having arthritis pain all over. She reports that it is becoming more frequent. She does not take anything OTC to help with the pain.    No Known Allergies   Current Outpatient Medications:  .  aspirin 81 MG tablet, Take 81 mg by mouth. , Disp: , Rfl:  .  hydrochlorothiazide (HYDRODIURIL) 25 MG tablet, TAKE 1 TABLET DAILY, Disp: 90 tablet, Rfl: 3 .  levothyroxine (SYNTHROID, LEVOTHROID) 50 MCG tablet, TAKE 1 TABLET DAILY BEFORE BREAKFAST, Disp: 90 tablet, Rfl: 3 .  metoprolol succinate (TOPROL-XL) 25 MG 24 hr tablet, Take 1 tablet (25 mg total) by mouth daily. Take with or immediately following a meal., Disp: 90 tablet, Rfl: 3 .  MULTIPLE VITAMIN PO, Take by mouth., Disp: , Rfl:  .  OMEGA-3 FATTY ACIDS PO, Take 1,040 mg by mouth daily. , Disp: , Rfl:  .  omeprazole (PRILOSEC) 20 MG capsule, Take 1 capsule (20 mg  total) by mouth daily., Disp: 90 capsule, Rfl: 1 .  quinapril (ACCUPRIL) 40 MG tablet, TAKE 1 TABLET DAILY, Disp: 90 tablet, Rfl: 3 .  rifaximin (XIFAXAN) 550 MG TABS tablet, Take 1 tablet (550 mg total) by mouth 3 (three) times daily., Disp: 15 tablet, Rfl: 0 .  vitamin B-12 (CYANOCOBALAMIN) 100 MCG tablet, Take 200 mcg by mouth daily., Disp: , Rfl:  .  ibuprofen (ADVIL,MOTRIN) 600 MG tablet, Take 1 tablet (600 mg total) by mouth every 8 (eight) hours as needed for mild pain or moderate pain. (Patient not taking: Reported on 07/16/2018), Disp: 30 tablet, Rfl: 0 .  metroNIDAZOLE (FLAGYL) 500 MG tablet, Take 1 tablet (500 mg total) by mouth 2 (two) times daily. (Patient not taking: Reported on 09/27/2018), Disp: 14 tablet, Rfl: 0  Review of Systems  Constitutional: Negative for activity change, appetite change, chills, diaphoresis, fatigue, fever and unexpected weight change.  Eyes: Negative.   Respiratory: Negative for cough and shortness of breath.   Cardiovascular: Negative for chest pain, palpitations and leg swelling.  Gastrointestinal: Negative.   Endocrine: Negative for cold intolerance, heat intolerance, polydipsia, polyphagia and polyuria.  Musculoskeletal: Positive for arthralgias.  Allergic/Immunologic: Negative.   Neurological: Negative for dizziness and headaches.  Psychiatric/Behavioral: Negative.  The patient is not nervous/anxious.     Social History   Tobacco Use  . Smoking status: Former Smoker  Last attempt to quit: 09/11/1997    Years since quitting: 21.0  . Smokeless tobacco: Never Used  Substance Use Topics  . Alcohol use: No    Alcohol/week: 0.0 standard drinks      Objective:   BP 116/76 (BP Location: Left Arm, Patient Position: Sitting, Cuff Size: Large)   Pulse 64   Temp 98.6 F (37 C)   Resp 16   Ht 5\' 6"  (1.676 m)   Wt 210 lb (95.3 kg)   SpO2 95%   BMI 33.89 kg/m  Vitals:   09/27/18 1353  BP: 116/76  Pulse: 64  Resp: 16  Temp: 98.6 F (37  C)  SpO2: 95%  Weight: 210 lb (95.3 kg)  Height: 5\' 6"  (1.676 m)     Physical Exam Constitutional:      Appearance: She is well-developed.  HENT:     Head: Normocephalic and atraumatic.     Right Ear: External ear normal.     Left Ear: External ear normal.     Nose: Nose normal.  Eyes:     General: No scleral icterus.    Conjunctiva/sclera: Conjunctivae normal.  Neck:     Thyroid: No thyromegaly.  Cardiovascular:     Rate and Rhythm: Normal rate and regular rhythm.     Heart sounds: Normal heart sounds.  Pulmonary:     Effort: Pulmonary effort is normal.     Breath sounds: Normal breath sounds.  Abdominal:     General: There is no distension.     Palpations: Abdomen is soft.     Tenderness: There is no abdominal tenderness.  Skin:    General: Skin is warm and dry.  Neurological:     Mental Status: She is alert and oriented to person, place, and time.  Psychiatric:        Behavior: Behavior normal.        Thought Content: Thought content normal.        Judgment: Judgment normal.         Assessment & Plan    1. Essential (primary) hypertension Controlled.  2. Gastroesophageal reflux disease, esophagitis presence not specified Long discussion with patient will try omeprazole every other day.  3. Adult hypothyroidism  - TSH  4. Arthritis     I have done the exam and reviewed the above chart and it is accurate to the best of my knowledge. Development worker, community has been used in this note in any air is in the dictation or transcription are unintentional.  [name], MD  [place]

## 2018-09-28 LAB — TSH: TSH: 3.22 u[IU]/mL (ref 0.450–4.500)

## 2018-10-08 ENCOUNTER — Telehealth: Payer: Self-pay

## 2018-10-08 NOTE — Telephone Encounter (Signed)
Patient advised as below.  

## 2018-10-08 NOTE — Telephone Encounter (Signed)
-----   Message from Jerrol Banana., MD sent at 10/04/2018  1:41 PM EST ----- Thyroid normal.

## 2018-10-23 DIAGNOSIS — Z1283 Encounter for screening for malignant neoplasm of skin: Secondary | ICD-10-CM | POA: Diagnosis not present

## 2018-10-23 DIAGNOSIS — Z859 Personal history of malignant neoplasm, unspecified: Secondary | ICD-10-CM | POA: Diagnosis not present

## 2018-10-23 DIAGNOSIS — Z872 Personal history of diseases of the skin and subcutaneous tissue: Secondary | ICD-10-CM | POA: Diagnosis not present

## 2018-10-23 DIAGNOSIS — L578 Other skin changes due to chronic exposure to nonionizing radiation: Secondary | ICD-10-CM | POA: Diagnosis not present

## 2018-11-05 ENCOUNTER — Ambulatory Visit: Payer: Self-pay

## 2018-11-24 ENCOUNTER — Other Ambulatory Visit: Payer: Self-pay | Admitting: Family Medicine

## 2018-11-24 DIAGNOSIS — I10 Essential (primary) hypertension: Secondary | ICD-10-CM

## 2018-11-29 ENCOUNTER — Encounter: Payer: Self-pay | Admitting: Family Medicine

## 2018-11-29 ENCOUNTER — Ambulatory Visit: Payer: Self-pay

## 2018-12-10 ENCOUNTER — Telehealth: Payer: Self-pay

## 2018-12-10 NOTE — Telephone Encounter (Signed)
Called pt to change telephonic AWV to virtual visit and pt declined and stated she would CB to schedule this later in the year.  -MM

## 2018-12-11 ENCOUNTER — Ambulatory Visit: Payer: Medicare HMO

## 2018-12-26 ENCOUNTER — Other Ambulatory Visit: Payer: Self-pay | Admitting: Family Medicine

## 2018-12-26 DIAGNOSIS — K219 Gastro-esophageal reflux disease without esophagitis: Secondary | ICD-10-CM

## 2018-12-27 ENCOUNTER — Other Ambulatory Visit: Payer: Self-pay

## 2018-12-27 ENCOUNTER — Ambulatory Visit (INDEPENDENT_AMBULATORY_CARE_PROVIDER_SITE_OTHER): Payer: Medicare HMO | Admitting: Family Medicine

## 2018-12-27 ENCOUNTER — Encounter: Payer: Self-pay | Admitting: Family Medicine

## 2018-12-27 VITALS — BP 147/64 | HR 54 | Temp 97.5°F | Ht 66.0 in | Wt 210.0 lb

## 2018-12-27 DIAGNOSIS — R69 Illness, unspecified: Secondary | ICD-10-CM | POA: Diagnosis not present

## 2018-12-27 DIAGNOSIS — F3340 Major depressive disorder, recurrent, in remission, unspecified: Secondary | ICD-10-CM

## 2018-12-27 MED ORDER — SERTRALINE HCL 50 MG PO TABS: 50.0000 mg | ORAL_TABLET | Freq: Every day | ORAL | 3 refills | Status: DC

## 2018-12-27 NOTE — Progress Notes (Signed)
       Patient: Rebecca Patton Female    DOB: Aug 07, 1946   73 y.o.   MRN: [id number] Visit Date: 12/27/2018  Today's Provider: [name], MD   Chief Complaint  Patient presents with  . Anxiety    per pt  . Stress    per pt   Subjective:     HPI  Pt reports issues with stress and anxiety.  No Known Allergies   Current Outpatient Medications:  .  aspirin 81 MG tablet, Take 81 mg by mouth. , Disp: , Rfl:  .  hydrochlorothiazide (HYDRODIURIL) 25 MG tablet, TAKE 1 TABLET DAILY, Disp: 90 tablet, Rfl: 3 .  levothyroxine (SYNTHROID, LEVOTHROID) 50 MCG tablet, TAKE 1 TABLET DAILY BEFORE BREAKFAST, Disp: 90 tablet, Rfl: 3 .  metoprolol succinate (TOPROL-XL) 25 MG 24 hr tablet, TAKE 1 TABLET DAILY . TAKE WITH OR IMMEDIATELY        FOLLOWING A MEAL, Disp: 90 tablet, Rfl: 3 .  MULTIPLE VITAMIN PO, Take by mouth., Disp: , Rfl:  .  OMEGA-3 FATTY ACIDS PO, Take 1,040 mg by mouth daily. , Disp: , Rfl:  .  omeprazole (PRILOSEC) 20 MG capsule, TAKE 1 CAPSULE DAILY, Disp: 90 capsule, Rfl: 1 .  quinapril (ACCUPRIL) 40 MG tablet, TAKE 1 TABLET DAILY, Disp: 90 tablet, Rfl: 3 .  sertraline (ZOLOFT) 50 MG tablet, Take 1 tablet (50 mg total) by mouth daily., Disp: 90 tablet, Rfl: 3 .  vitamin B-12 (CYANOCOBALAMIN) 100 MCG tablet, Take 200 mcg by mouth daily., Disp: , Rfl:  .  ibuprofen (ADVIL,MOTRIN) 600 MG tablet, Take 1 tablet (600 mg total) by mouth every 8 (eight) hours as needed for mild pain or moderate pain. (Patient not taking: Reported on 07/16/2018), Disp: 30 tablet, Rfl: 0 .  metroNIDAZOLE (FLAGYL) 500 MG tablet, Take 1 tablet (500 mg total) by mouth 2 (two) times daily. (Patient not taking: Reported on 09/27/2018), Disp: 14 tablet, Rfl: 0 .  rifaximin (XIFAXAN) 550 MG TABS tablet, Take 1 tablet (550 mg total) by mouth 3 (three) times daily. (Patient not taking: Reported on 12/27/2018), Disp: 15 tablet, Rfl: 0  Review of Systems  Constitutional: Negative.   HENT: Negative.   Eyes:  Negative.   Respiratory: Negative.   Cardiovascular: Negative.   Gastrointestinal: Negative.   Endocrine: Negative.   Genitourinary: Negative.   Musculoskeletal: Negative.   Skin: Negative.   Allergic/Immunologic: Negative.   Neurological: Negative.   Hematological: Negative.   Psychiatric/Behavioral: The patient is nervous/anxious (stress).     Social History   Tobacco Use  . Smoking status: Former Smoker    Last attempt to quit: 09/11/1997    Years since quitting: 21.3  . Smokeless tobacco: Never Used  Substance Use Topics  . Alcohol use: No    Alcohol/week: 0.0 standard drinks      Objective:   BP (!) 147/64   Pulse (!) 54   Temp (!) 97.5 F (36.4 C) (Oral)   Ht 5\' 6"  (1.676 m)   Wt 210 lb (95.3 kg)   BMI 33.89 kg/m  Vitals:   12/27/18 1338  BP: (!) 147/64  Pulse: (!) 54  Temp: (!) 97.5 F (36.4 C)  TempSrc: Oral  Weight: 210 lb (95.3 kg)  Height: 5\' 6"  (1.676 m)     Physical Exam      Assessment & Plan        [name], MD  [facility]

## 2018-12-27 NOTE — Progress Notes (Signed)
Established Patient Office Visit Subjective:  Patient ID: Rebecca Patton, female    DOB: 08/08/1946  Age: 73 y.o. MRN: [id number] Virtual Visit via Telephone Note  I connected with Rebecca Patton on 12/27/18 at  1:20 PM EDT by telephone and verified that I am speaking with the correct person using two identifiers.   I discussed the limitations, risks, security and privacy concerns of performing an evaluation and management service by telephone and the availability of in person appointments. I also discussed with the patient that there may be a patient responsible charge related to this service. The patient expressed understanding and agreed to proceed.   History of Present Illness: See below note.   Observations/Objective:   Assessment and Plan: Recurrent major depressive disorder mild, worsening.  Follow Up Instructions: Start sertraline.  Recheck 5 weeks.  Call back with any problems.   I discussed the assessment and treatment plan with the patient. The patient was provided an opportunity to ask questions and all were answered. The patient agreed with the plan and demonstrated an understanding of the instructions.   The patient was advised to call back or seek an in-person evaluation if the symptoms worsen or if the condition fails to improve as anticipated.  I provided 12 minutes of non-face-to-face time during this encounter.   Xolani Degracia Cranford Mon, MD   CC: No chief complaint on file. Depression  HPI Rebecca Patton presents for depression.  This is a phone interview and this patient did not have access to the computer and could not come to the office because of coronavirus pandemic.  She admits to being slightly and progressively more depressed during the pandemic and during shelter at home.  Her husband has progressive Alzheimer's dementia.  She is not suicidal or homicidal.  She is eating and sleeping okay.  She is just having more anhedonia and depression.  Feels as though  the situation is very difficult.  Past Medical History:  Diagnosis Date  . Arthritis   . Depression   . Dysrhythmia    ST  . High blood pressure   . Hypothyroidism   . MRSA infection 2006   on nose  . Uterine cancer [facility])     Past Surgical History:  Procedure Laterality Date  . ABDOMINAL HYSTERECTOMY  2009    BSO  . BUNIONECTOMY    . EXCISION NEUROMA    . INSERTION OF MESH N/A 05/18/2018   Procedure: INSERTION OF MESH;  Surgeon: Vickie Epley, MD;  Location: [facility] ORS;  Service: General;  Laterality: N/A;  . NASAL SINUS SURGERY    . TUBAL LIGATION    . UMBILICAL HERNIA REPAIR N/A 05/18/2018   Procedure: HERNIA REPAIR SUPRA-UMBILICAL ADULT;  Surgeon: Vickie Epley, MD;  Location: [facility] ORS;  Service: General;  Laterality: N/A;    Family History  Problem Relation Age of Onset  . Parkinson's disease Mother   . Diabetes Father   . CAD Father   . Cancer Sister        squamous cell cancer in her eye  . Pancreatic cancer Brother   . Tremor Brother   . Breast cancer Neg Hx     Social History   Socioeconomic History  . Marital status: Married    Spouse name: Not on file  . Number of children: 2  . Years of education: Not on file  . Highest education level: Some college, no degree  Occupational History  . Occupation: receptionist part time  Social Needs  . Financial resource strain: Hard  . Food insecurity:    Worry: Never true    Inability: Never true  . Transportation needs:    Medical: No    Non-medical: No  Tobacco Use  . Smoking status: Former Smoker    Last attempt to quit: 09/11/1997    Years since quitting: 21.3  . Smokeless tobacco: Never Used  Substance and Sexual Activity  . Alcohol use: No    Alcohol/week: 0.0 standard drinks  . Drug use: No  . Sexual activity: Not Currently  Lifestyle  . Physical activity:    Days per week: Not on file    Minutes per session: Not on file  . Stress: To some extent  Relationships  . Social connections:     Talks on phone: Not on file    Gets together: Not on file    Attends religious service: Not on file    Active member of club or organization: Not on file    Attends meetings of clubs or organizations: Not on file    Relationship status: Not on file  . Intimate partner violence:    Fear of current or ex partner: Not on file    Emotionally abused: Not on file    Physically abused: Not on file    Forced sexual activity: Not on file  Other Topics Concern  . Not on file  Social History Narrative   Husband is physically and mentally disable following a car wreck in 2006.    Outpatient Medications Prior to Visit  Medication Sig Dispense Refill  . aspirin 81 MG tablet Take 81 mg by mouth.     . hydrochlorothiazide (HYDRODIURIL) 25 MG tablet TAKE 1 TABLET DAILY 90 tablet 3  . ibuprofen (ADVIL,MOTRIN) 600 MG tablet Take 1 tablet (600 mg total) by mouth every 8 (eight) hours as needed for mild pain or moderate pain. (Patient not taking: Reported on 07/16/2018) 30 tablet 0  . levothyroxine (SYNTHROID, LEVOTHROID) 50 MCG tablet TAKE 1 TABLET DAILY BEFORE BREAKFAST 90 tablet 3  . metoprolol succinate (TOPROL-XL) 25 MG 24 hr tablet TAKE 1 TABLET DAILY . TAKE WITH OR IMMEDIATELY        FOLLOWING A MEAL 90 tablet 3  . metroNIDAZOLE (FLAGYL) 500 MG tablet Take 1 tablet (500 mg total) by mouth 2 (two) times daily. (Patient not taking: Reported on 09/27/2018) 14 tablet 0  . MULTIPLE VITAMIN PO Take by mouth.    . OMEGA-3 FATTY ACIDS PO Take 1,040 mg by mouth daily.     Marland Kitchen omeprazole (PRILOSEC) 20 MG capsule TAKE 1 CAPSULE DAILY 90 capsule 1  . quinapril (ACCUPRIL) 40 MG tablet TAKE 1 TABLET DAILY 90 tablet 3  . rifaximin (XIFAXAN) 550 MG TABS tablet Take 1 tablet (550 mg total) by mouth 3 (three) times daily. 15 tablet 0  . vitamin B-12 (CYANOCOBALAMIN) 100 MCG tablet Take 200 mcg by mouth daily.     No facility-administered medications prior to visit.     No Known Allergies  ROS Review of Systems   Constitutional: Negative.   Respiratory: Negative.   Cardiovascular: Negative.   Allergic/Immunologic: Negative.   Psychiatric/Behavioral: Positive for dysphoric mood.      Objective:    Physical Exam  Psychiatric: She has a normal mood and affect. Her behavior is normal. Judgment and thought content normal.    There were no vitals taken for this visit. Wt Readings from Last 3 Encounters:  09/27/18 210 lb (95.3  kg)  07/16/18 206 lb 9.6 oz (93.7 kg)  06/14/18 203 lb 3.2 oz (92.2 kg)     Health Maintenance Due  Topic Date Due  . TETANUS/TDAP  12/20/1966    There are no preventive care reminders to display for this patient.  Lab Results  Component Value Date   TSH 3.220 09/27/2018   Lab Results  Component Value Date   WBC 8.4 07/16/2018   HGB 14.4 07/16/2018   HCT 43.2 07/16/2018   MCV 88 07/16/2018   PLT 266 07/16/2018   Lab Results  Component Value Date   NA 137 07/16/2018   K 4.0 07/16/2018   CO2 22 07/16/2018   GLUCOSE 86 07/16/2018   BUN 14 07/16/2018   CREATININE 0.83 07/16/2018   BILITOT 0.3 07/16/2018   ALKPHOS 73 07/16/2018   AST 23 07/16/2018   ALT 20 07/16/2018   PROT 6.7 07/16/2018   ALBUMIN 4.3 07/16/2018   CALCIUM 9.4 07/16/2018   ANIONGAP 8 05/16/2018   Lab Results  Component Value Date   CHOL 219 (H) 11/08/2017   Lab Results  Component Value Date   HDL 56 11/08/2017   Lab Results  Component Value Date   LDLCALC 126 (H) 11/08/2017   Lab Results  Component Value Date   TRIG 183 (H) 11/08/2017   Lab Results  Component Value Date   CHOLHDL 3.9 11/08/2017   Lab Results  Component Value Date   HGBA1C 5.6 05/11/2016      Assessment & Plan:   Problem List Items Addressed This Visit    None    1. Recurrent major depressive disorder, in remission ([place]) Worsening in face of pandemic.  Patient is in shelter at home with husband with dementia.  After  discussion we will start sertraline 50 mg one half daily for a week and then  go to 1 daily and recheck in 5 weeks.   Meds ordered this encounter  Medications  . sertraline (ZOLOFT) 50 MG tablet    Sig: Take 1 tablet (50 mg total) by mouth daily.    Dispense:  90 tablet    Refill:  3    Follow-up: No follow-ups on file.    [name], MD

## 2019-02-05 DIAGNOSIS — S32030A Wedge compression fracture of third lumbar vertebra, initial encounter for closed fracture: Secondary | ICD-10-CM | POA: Diagnosis not present

## 2019-02-05 DIAGNOSIS — S3992XA Unspecified injury of lower back, initial encounter: Secondary | ICD-10-CM | POA: Diagnosis not present

## 2019-02-05 DIAGNOSIS — S39012A Strain of muscle, fascia and tendon of lower back, initial encounter: Secondary | ICD-10-CM | POA: Diagnosis not present

## 2019-02-05 DIAGNOSIS — S32010A Wedge compression fracture of first lumbar vertebra, initial encounter for closed fracture: Secondary | ICD-10-CM | POA: Diagnosis not present

## 2019-02-19 ENCOUNTER — Ambulatory Visit (INDEPENDENT_AMBULATORY_CARE_PROVIDER_SITE_OTHER): Payer: Medicare HMO | Admitting: Family Medicine

## 2019-02-19 ENCOUNTER — Other Ambulatory Visit: Payer: Self-pay

## 2019-02-19 ENCOUNTER — Encounter: Payer: Self-pay | Admitting: Family Medicine

## 2019-02-19 VITALS — BP 134/64 | HR 58 | Temp 98.6°F | Resp 16 | Wt 208.6 lb

## 2019-02-19 DIAGNOSIS — M545 Low back pain, unspecified: Secondary | ICD-10-CM

## 2019-02-19 DIAGNOSIS — M810 Age-related osteoporosis without current pathological fracture: Secondary | ICD-10-CM | POA: Diagnosis not present

## 2019-02-19 DIAGNOSIS — G8929 Other chronic pain: Secondary | ICD-10-CM | POA: Diagnosis not present

## 2019-02-19 DIAGNOSIS — I10 Essential (primary) hypertension: Secondary | ICD-10-CM

## 2019-02-19 DIAGNOSIS — C549 Malignant neoplasm of corpus uteri, unspecified: Secondary | ICD-10-CM | POA: Diagnosis not present

## 2019-02-19 DIAGNOSIS — M25511 Pain in right shoulder: Secondary | ICD-10-CM | POA: Diagnosis not present

## 2019-02-19 DIAGNOSIS — E039 Hypothyroidism, unspecified: Secondary | ICD-10-CM | POA: Diagnosis not present

## 2019-02-19 MED ORDER — BACLOFEN 10 MG PO TABS: 10.0000 mg | ORAL_TABLET | Freq: Three times a day (TID) | ORAL | 0 refills | Status: DC | PRN

## 2019-02-19 NOTE — Progress Notes (Signed)
Patient: Rebecca Patton Female    DOB: [birth year]   73 y.o.   MRN: [id number] Visit Date: 02/19/2019  Today's Provider: [name], MD   Chief Complaint  Patient presents with  . Back Pain   Subjective:     Back Pain  This is a new problem. The current episode started 1 to 4 weeks ago (patient was seen in [place] walk in clinic 02/04/19 and diagnosed with muscle strain). The problem occurs constantly. The problem is unchanged. The pain is present in the lumbar spine. The quality of the pain is described as stabbing. The pain does not radiate. The pain is at a severity of 10/10. The pain is severe. The pain is the same all the time. The symptoms are aggravated by bending, position, sitting, coughing and twisting. Stiffness is present all day. Pertinent negatives include no abdominal pain, bladder incontinence, bowel incontinence, chest pain, dysuria, fever, headaches, leg pain, numbness, paresis, paresthesias, pelvic pain, perianal numbness, tingling, weakness or weight loss. She has tried muscle relaxant (Oxycodone, Tramadol) for the symptoms. The treatment provided moderate relief.  She was told there was a compression fracture but I cannot find imaging notes from [facility] The pain is much better and down to a 2/10. She also is having pain in her right shoulder with movement.  No Known Allergies   Current Outpatient Medications:  .  aspirin 81 MG tablet, Take 81 mg by mouth. , Disp: , Rfl:  .  hydrochlorothiazide (HYDRODIURIL) 25 MG tablet, TAKE 1 TABLET DAILY, Disp: 90 tablet, Rfl: 3 .  ibuprofen (ADVIL,MOTRIN) 600 MG tablet, Take 1 tablet (600 mg total) by mouth every 8 (eight) hours as needed for mild pain or moderate pain. (Patient not taking: Reported on 07/16/2018), Disp: 30 tablet, Rfl: 0 .  levothyroxine (SYNTHROID, LEVOTHROID) 50 MCG tablet, TAKE 1 TABLET DAILY BEFORE BREAKFAST, Disp: 90 tablet, Rfl: 3 .  metoprolol succinate (TOPROL-XL) 25 MG 24 hr tablet, TAKE 1 TABLET  DAILY . TAKE WITH OR IMMEDIATELY        FOLLOWING A MEAL, Disp: 90 tablet, Rfl: 3 .  metroNIDAZOLE (FLAGYL) 500 MG tablet, Take 1 tablet (500 mg total) by mouth 2 (two) times daily. (Patient not taking: Reported on 09/27/2018), Disp: 14 tablet, Rfl: 0 .  MULTIPLE VITAMIN PO, Take by mouth., Disp: , Rfl:  .  OMEGA-3 FATTY ACIDS PO, Take 1,040 mg by mouth daily. , Disp: , Rfl:  .  omeprazole (PRILOSEC) 20 MG capsule, TAKE 1 CAPSULE DAILY, Disp: 90 capsule, Rfl: 1 .  quinapril (ACCUPRIL) 40 MG tablet, TAKE 1 TABLET DAILY, Disp: 90 tablet, Rfl: 3 .  rifaximin (XIFAXAN) 550 MG TABS tablet, Take 1 tablet (550 mg total) by mouth 3 (three) times daily. (Patient not taking: Reported on 12/27/2018), Disp: 15 tablet, Rfl: 0 .  sertraline (ZOLOFT) 50 MG tablet, Take 1 tablet (50 mg total) by mouth daily., Disp: 90 tablet, Rfl: 3 .  vitamin B-12 (CYANOCOBALAMIN) 100 MCG tablet, Take 200 mcg by mouth daily., Disp: , Rfl:   Review of Systems  Constitutional: Negative for fever and weight loss.  Respiratory: Negative.   Cardiovascular: Negative for chest pain.  Gastrointestinal: Negative for abdominal pain and bowel incontinence.  Endocrine: Negative.   Genitourinary: Negative for bladder incontinence, dysuria and pelvic pain.  Musculoskeletal: Positive for arthralgias and back pain.  Allergic/Immunologic: Negative.   Neurological: Negative for tingling, weakness, numbness, headaches and paresthesias.  Psychiatric/Behavioral: Negative.  Social History   Tobacco Use  . Smoking status: Former Smoker    Last attempt to quit: 09/11/1997    Years since quitting: 21.4  . Smokeless tobacco: Never Used  Substance Use Topics  . Alcohol use: No    Alcohol/week: 0.0 standard drinks      Objective:   BP 134/64   Pulse (!) 58   Temp 98.6 F (37 C) (Oral)   Resp 16   Wt 208 lb 9.6 oz (94.6 kg)   SpO2 96%   BMI 33.67 kg/m  Vitals:   02/19/19 1020  BP: 134/64  Pulse: (!) 58  Resp: 16  Temp: 98.6  F (37 C)  TempSrc: Oral  SpO2: 96%  Weight: 208 lb 9.6 oz (94.6 kg)     Physical Exam Vitals signs reviewed.  Constitutional:      Appearance: She is well-developed.  HENT:     Head: Normocephalic and atraumatic.     Right Ear: External ear normal.     Left Ear: External ear normal.     Nose: Nose normal.  Eyes:     General: No scleral icterus.    Conjunctiva/sclera: Conjunctivae normal.  Neck:     Thyroid: No thyromegaly.  Cardiovascular:     Rate and Rhythm: Normal rate and regular rhythm.     Heart sounds: Normal heart sounds.  Pulmonary:     Effort: Pulmonary effort is normal.     Breath sounds: Normal breath sounds.  Abdominal:     General: There is no distension.     Palpations: Abdomen is soft.     Tenderness: There is no abdominal tenderness.  Musculoskeletal:        General: Tenderness present.     Comments: Mild tenderness over mid LS spine.Mild discomfort with abduction of right shoulder.  Skin:    General: Skin is warm and dry.  Neurological:     Mental Status: She is alert and oriented to person, place, and time.     Comments: Strength normal in both LE. Straight leg raise negative.  Psychiatric:        Mood and Affect: Mood normal.        Behavior: Behavior normal.        Thought Content: Thought content normal.        Judgment: Judgment normal.         Assessment & Plan    1. Osteoporosis, unspecified osteoporosis type, unspecified pathological fracture presence Apparent vertebral compression fracure--improving --no referral needed. - DG Bone Density; Future  2. Acute midline low back pain without sciatica BMD  3. Chronic right shoulder pain Xray shoulder--may need ortho referral.  4. Essential (primary) hypertension Controlled.  5. Adult hypothyroidism   6. Malignant neoplasm of corpus uteri, except isthmus ([place]) Remote history.     Richard Cranford Mon, MD  [facility]  Ethie Curless,acting as a scribe for [name], MD.,have documented all relevant documentation on the behalf of [name], MD,as directed by  [name], MD while in the presence of [name], MD.

## 2019-03-25 DIAGNOSIS — R69 Illness, unspecified: Secondary | ICD-10-CM | POA: Diagnosis not present

## 2019-03-28 ENCOUNTER — Encounter: Payer: Self-pay | Admitting: Family Medicine

## 2019-03-28 ENCOUNTER — Telehealth: Payer: Self-pay

## 2019-03-28 ENCOUNTER — Other Ambulatory Visit: Payer: Self-pay

## 2019-03-28 ENCOUNTER — Ambulatory Visit (INDEPENDENT_AMBULATORY_CARE_PROVIDER_SITE_OTHER): Payer: Medicare HMO | Admitting: Family Medicine

## 2019-03-28 VITALS — BP 120/72 | HR 57 | Temp 98.1°F | Resp 16 | Wt 209.6 lb

## 2019-03-28 DIAGNOSIS — I10 Essential (primary) hypertension: Secondary | ICD-10-CM

## 2019-03-28 DIAGNOSIS — E039 Hypothyroidism, unspecified: Secondary | ICD-10-CM

## 2019-03-28 DIAGNOSIS — K219 Gastro-esophageal reflux disease without esophagitis: Secondary | ICD-10-CM

## 2019-03-28 NOTE — Progress Notes (Signed)
Patient: Rebecca Patton Female    DOB: June 06, 1946   73 y.o.   MRN: [id number] Visit Date: 03/28/2019  Today's Provider: [name], MD   Chief Complaint  Patient presents with  . Hypothyroidism  . Gastroesophageal Reflux   Subjective:     HPI   GERD, Follow up: He needs daily PPI to control symptoms. The patient was last seen for GERD 6 months ago. Changes made since that visit include taking Omeprazole every other day.  She reports fair compliance with treatment. She is not having side effects. .  She IS experiencing belching and eructation. She is NOT experiencing no symptoms  ------------------------------------------------------------------------   Hypothyroid, follow-up:  TSH  Date Value Ref Range Status  09/27/2018 3.220 0.450 - 4.500 uIU/mL Final  11/08/2017 4.180 0.450 - 4.500 uIU/mL Final  11/06/2015 3.680 0.450 - 4.500 uIU/mL Final   Wt Readings from Last 3 Encounters:  03/28/19 209 lb 9.6 oz (95.1 kg)  02/19/19 208 lb 9.6 oz (94.6 kg)  12/27/18 210 lb (95.3 kg)    She was last seen for hypothyroid 6 months ago.  Management since that visit includes none. She reports excellent compliance with treatment. She is not having side effects.  She is not exercising. She is experiencing none She denies change in energy level, diarrhea, heat / cold intolerance, nervousness, palpitations and weight changes Weight trend: stable  ------------------------------------------------------------------------   No Known Allergies   Current Outpatient Medications:  .  aspirin 81 MG tablet, Take 81 mg by mouth. , Disp: , Rfl:  .  baclofen (LIORESAL) 10 MG tablet, Take 1 tablet (10 mg total) by mouth 3 (three) times daily as needed for muscle spasms., Disp: 40 each, Rfl: 0 .  hydrochlorothiazide (HYDRODIURIL) 25 MG tablet, TAKE 1 TABLET DAILY, Disp: 90 tablet, Rfl: 3 .  levothyroxine (SYNTHROID, LEVOTHROID) 50 MCG tablet, TAKE 1 TABLET DAILY BEFORE  BREAKFAST, Disp: 90 tablet, Rfl: 3 .  metoprolol succinate (TOPROL-XL) 25 MG 24 hr tablet, TAKE 1 TABLET DAILY . TAKE WITH OR IMMEDIATELY        FOLLOWING A MEAL, Disp: 90 tablet, Rfl: 3 .  MULTIPLE VITAMIN PO, Take by mouth., Disp: , Rfl:  .  OMEGA-3 FATTY ACIDS PO, Take 1,040 mg by mouth daily. , Disp: , Rfl:  .  omeprazole (PRILOSEC) 20 MG capsule, TAKE 1 CAPSULE DAILY, Disp: 90 capsule, Rfl: 1 .  quinapril (ACCUPRIL) 40 MG tablet, TAKE 1 TABLET DAILY, Disp: 90 tablet, Rfl: 3 .  sertraline (ZOLOFT) 50 MG tablet, Take 1 tablet (50 mg total) by mouth daily., Disp: 90 tablet, Rfl: 3 .  traMADol (ULTRAM) 50 MG tablet, , Disp: , Rfl:  .  vitamin B-12 (CYANOCOBALAMIN) 100 MCG tablet, Take 200 mcg by mouth daily., Disp: , Rfl:   Review of Systems  Constitutional: Negative for activity change, appetite change, chills, diaphoresis, fatigue, fever and unexpected weight change.  Eyes: Negative.   Respiratory: Negative for cough and shortness of breath.   Cardiovascular: Negative for chest pain, palpitations and leg swelling.  Gastrointestinal: Negative.   Endocrine: Negative for cold intolerance, heat intolerance, polydipsia, polyphagia and polyuria.  Musculoskeletal: Positive for arthralgias.  Allergic/Immunologic: Negative.   Neurological: Negative for dizziness and headaches.  Psychiatric/Behavioral: Negative.  The patient is not nervous/anxious.     Social History   Tobacco Use  . Smoking status: Former Smoker    Quit date: 09/11/1997    Years since quitting: 21.5  . Smokeless  tobacco: Never Used  Substance Use Topics  . Alcohol use: No    Alcohol/week: 0.0 standard drinks      Objective:   BP 120/72   Pulse (!) 57   Temp 98.1 F (36.7 C) (Oral)   Resp 16   Wt 209 lb 9.6 oz (95.1 kg)   BMI 33.83 kg/m  Vitals:   03/28/19 1349  BP: 120/72  Pulse: (!) 57  Resp: 16  Temp: 98.1 F (36.7 C)  TempSrc: Oral  Weight: 209 lb 9.6 oz (95.1 kg)     Physical Exam Vitals signs  reviewed.  Constitutional:      Appearance: She is well-developed.  HENT:     Head: Normocephalic and atraumatic.     Right Ear: External ear normal.     Left Ear: External ear normal.     Nose: Nose normal.  Eyes:     General: No scleral icterus.    Conjunctiva/sclera: Conjunctivae normal.  Neck:     Thyroid: No thyromegaly.  Cardiovascular:     Rate and Rhythm: Normal rate and regular rhythm.     Heart sounds: Normal heart sounds.  Pulmonary:     Effort: Pulmonary effort is normal.     Breath sounds: Normal breath sounds.  Abdominal:     General: There is no distension.     Palpations: Abdomen is soft.     Tenderness: There is no abdominal tenderness.  Skin:    General: Skin is warm and dry.  Neurological:     General: No focal deficit present.     Mental Status: She is alert and oriented to person, place, and time.  Psychiatric:        Mood and Affect: Mood normal.        Behavior: Behavior normal.        Thought Content: Thought content normal.        Judgment: Judgment normal.      No results found for any visits on 03/28/19.     Assessment & Plan    1. Adult hypothyroidism  - TSH  2. Essential (primary) hypertension On quinapril/HCY/Metoprolol.  3. Gastroesophageal reflux disease, esophagitis presence not specified On daily PPI for symptom control.     Uri Turnbough Cranford Mon, MD  [facility] [place],acting as a scribe for [name], MD.,have documented all relevant documentation on the behalf of [name], MD,as directed by  [name], MD while in the presence of [name], MD.

## 2019-03-28 NOTE — Telephone Encounter (Signed)
Patient would like to discuss with you and schedule her next AWV for 2021. She request a call back on her home line when your available. KW

## 2019-03-29 LAB — TSH: TSH: 3.88 u[IU]/mL (ref 0.450–4.500)

## 2019-04-01 ENCOUNTER — Telehealth: Payer: Self-pay

## 2019-04-01 NOTE — Telephone Encounter (Signed)
Patient advised.KW 

## 2019-04-01 NOTE — Telephone Encounter (Signed)
-----   Message from Jerrol Banana., MD sent at 03/29/2019 12:20 PM EDT ----- TSH normal.

## 2019-04-09 NOTE — Telephone Encounter (Signed)
Pt was driving at the time and states she will CB later.

## 2019-04-11 ENCOUNTER — Other Ambulatory Visit: Payer: Self-pay

## 2019-04-11 ENCOUNTER — Ambulatory Visit
Admission: RE | Admit: 2019-04-11 | Discharge: 2019-04-11 | Disposition: A | Discharge status: Home / Self Care | Payer: Medicare HMO | Source: Ambulatory Visit | Attending: Family Medicine | Admitting: Family Medicine

## 2019-04-11 DIAGNOSIS — M810 Age-related osteoporosis without current pathological fracture: Secondary | ICD-10-CM | POA: Insufficient documentation

## 2019-04-11 DIAGNOSIS — M85852 Other specified disorders of bone density and structure, left thigh: Secondary | ICD-10-CM | POA: Diagnosis not present

## 2019-04-11 DIAGNOSIS — Z780 Asymptomatic menopausal state: Secondary | ICD-10-CM | POA: Diagnosis not present

## 2019-04-11 DIAGNOSIS — M8588 Other specified disorders of bone density and structure, other site: Secondary | ICD-10-CM | POA: Diagnosis not present

## 2019-04-15 NOTE — Telephone Encounter (Signed)
Spoke with pt who couldn't remember why she called and said it was not regarding her AWV for 2021 as stated in the message. Inquired about scheduling an AWV and pt declined and said she'd call back at a later date to schedule.

## 2019-04-26 ENCOUNTER — Other Ambulatory Visit: Payer: Self-pay

## 2019-04-26 ENCOUNTER — Ambulatory Visit (INDEPENDENT_AMBULATORY_CARE_PROVIDER_SITE_OTHER): Payer: Medicare HMO | Admitting: Obstetrics and Gynecology

## 2019-04-26 ENCOUNTER — Encounter: Payer: Self-pay | Admitting: Obstetrics and Gynecology

## 2019-04-26 VITALS — BP 134/61 | HR 67 | Ht 65.0 in | Wt 208.7 lb

## 2019-04-26 DIAGNOSIS — Z6834 Body mass index (BMI) 34.0-34.9, adult: Secondary | ICD-10-CM | POA: Diagnosis not present

## 2019-04-26 DIAGNOSIS — Z01419 Encounter for gynecological examination (general) (routine) without abnormal findings: Secondary | ICD-10-CM | POA: Diagnosis not present

## 2019-04-26 DIAGNOSIS — Z1231 Encounter for screening mammogram for malignant neoplasm of breast: Secondary | ICD-10-CM

## 2019-04-26 NOTE — Progress Notes (Signed)
Subjective:   Rebecca Patton is a 73 y.o. G46P2 Caucasian female here for a routine well-woman exam.  No LMP recorded. Patient has had a hysterectomy.    Current complaints: none PCP: Rosanna Randy       PCP does labs  Social History: Sexual: heterosexual Marital Status: married Living situation: with spouse Occupation: PT Research scientist (physical sciences) at [employer] Tobacco/alcohol: no tobacco use Illicit drugs: no history of illicit drug use  The following portions of the patient's history were reviewed and updated as appropriate: allergies, current medications, past family history, past medical history, past social history, past surgical history and problem list.  Past Medical History Past Medical History:  Diagnosis Date  . Arthritis   . Depression   . Dysrhythmia    ST  . High blood pressure   . Hypothyroidism   . MRSA infection 2006   on nose  . Uterine cancer [facility])     Past Surgical History Past Surgical History:  Procedure Laterality Date  . ABDOMINAL HYSTERECTOMY  2009    BSO  . BUNIONECTOMY    . EXCISION NEUROMA    . INSERTION OF MESH N/A 05/18/2018   Procedure: INSERTION OF MESH;  Surgeon: Vickie Epley, MD;  Location: [facility] ORS;  Service: General;  Laterality: N/A;  . NASAL SINUS SURGERY    . TUBAL LIGATION    . UMBILICAL HERNIA REPAIR N/A 05/18/2018   Procedure: HERNIA REPAIR SUPRA-UMBILICAL ADULT;  Surgeon: Vickie Epley, MD;  Location: [facility] ORS;  Service: General;  Laterality: N/A;    Gynecologic History G2P2  No LMP recorded. Patient has had a hysterectomy. Contraception: post menopausal status  Last mammogram: 11/2017. Results were: normal   Obstetric History OB History  Gravida Para Term Preterm AB Living  2 2          SAB TAB Ectopic Multiple Live Births               # Outcome Date GA Lbr Len/2nd Weight Sex Delivery Anes PTL Lv  2 Para 1972    F Vag-Spont     1 Para 1969    F Vag-Spont       Current Medications Current Outpatient Medications on File Prior  to Visit  Medication Sig Dispense Refill  . aspirin 81 MG tablet Take 81 mg by mouth.     . hydrochlorothiazide (HYDRODIURIL) 25 MG tablet TAKE 1 TABLET DAILY 90 tablet 3  . levothyroxine (SYNTHROID, LEVOTHROID) 50 MCG tablet TAKE 1 TABLET DAILY BEFORE BREAKFAST 90 tablet 3  . metoprolol succinate (TOPROL-XL) 25 MG 24 hr tablet TAKE 1 TABLET DAILY . TAKE WITH OR IMMEDIATELY        FOLLOWING A MEAL 90 tablet 3  . OMEGA-3 FATTY ACIDS PO Take 1,040 mg by mouth daily.     Marland Kitchen omeprazole (PRILOSEC) 20 MG capsule TAKE 1 CAPSULE DAILY 90 capsule 1  . quinapril (ACCUPRIL) 40 MG tablet TAKE 1 TABLET DAILY 90 tablet 3  . sertraline (ZOLOFT) 50 MG tablet Take 1 tablet (50 mg total) by mouth daily. 90 tablet 3  . vitamin B-12 (CYANOCOBALAMIN) 100 MCG tablet Take 200 mcg by mouth daily.    . MULTIPLE VITAMIN PO Take by mouth.    . traMADol (ULTRAM) 50 MG tablet      No current facility-administered medications on file prior to visit.     Review of Systems Patient denies any headaches, blurred vision, shortness of breath, chest pain, abdominal pain, problems with bowel movements, urination, or  intercourse.  Objective:  BP 134/61   Pulse 67   Ht 5\' 5"  (1.651 m)   Wt 208 lb 11 oz (94.7 kg)   BMI 34.73 kg/m  Physical Exam  General:  Well developed, well nourished, no acute distress. She is alert and oriented x3. Skin:  Warm and dry Neck:  Midline trachea, no thyromegaly or nodules Cardiovascular: Regular rate and rhythm, no murmur heard Lungs:  Effort normal, all lung fields clear to auscultation bilaterally Breasts:  No dominant palpable mass, retraction, or nipple discharge Abdomen:  Soft, non tender, no hepatosplenomegaly or masses Pelvic:  External genitalia is normal in appearance.  The vagina is normal in appearance. The cervix is bulbous, no CMT.  Thin prep pap is not done . Uterus is felt to be normal size, shape, and contour.  No adnexal masses or tenderness noted. Extremities:  No swelling  or varicosities noted Psych:  She has a normal mood and affect  Assessment:   Healthy well-woman exam BMI 34   Plan:   F/U 1 year for AE, or sooner if needed Mammogram ordered  Melody Rockney Ghee, CNM
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
## 2019-05-09 ENCOUNTER — Other Ambulatory Visit: Payer: Self-pay

## 2019-05-09 ENCOUNTER — Ambulatory Visit (INDEPENDENT_AMBULATORY_CARE_PROVIDER_SITE_OTHER): Payer: Medicare HMO

## 2019-05-09 DIAGNOSIS — Z0000 Encounter for general adult medical examination without abnormal findings: Secondary | ICD-10-CM | POA: Diagnosis not present

## 2019-05-09 NOTE — Patient Instructions (Addendum)
Rebecca Patton , Thank you for taking time to come for your Medicare Wellness Visit. I appreciate your ongoing commitment to your health goals. Please review the following plan we discussed and let me know if I can assist you in the future.   Screening recommendations/referrals: Colonoscopy: Up to date, due 07/2023 Mammogram: Ordered 04/26/19 Bone Density: Up to date, due 03/2029 Recommended yearly ophthalmology/optometry visit for glaucoma screening and checkup Recommended yearly dental visit for hygiene and checkup  Vaccinations: Influenza vaccine: Currently due  Pneumococcal vaccine: Completed series Tdap vaccine: Pt declines today.  Shingles vaccine: Pt declines today.     Advanced directives: Currently on file.  Conditions/risks identified: Recommend to start back exercising for 3 days a week for 30 minutes at a time.   Next appointment: 05/14/20 @ 2:00 PM for an AWV. Declined scheduling a CPE at this time.    Preventive Care 71 Years and Older, Female Preventive care refers to lifestyle choices and visits with your health care provider that can promote health and wellness. What does preventive care include?  A yearly physical exam. This is also called an annual well check.  Dental exams once or twice a year.  Routine eye exams. Ask your health care provider how often you should have your eyes checked.  Personal lifestyle choices, including:  Daily care of your teeth and gums.  Regular physical activity.  Eating a healthy diet.  Avoiding tobacco and drug use.  Limiting alcohol use.  Practicing safe sex.  Taking low-dose aspirin every day.  Taking vitamin and mineral supplements as recommended by your health care provider. What happens during an annual well check? The services and screenings done by your health care provider during your annual well check will depend on your age, overall health, lifestyle risk factors, and family history of disease. Counseling  Your  health care provider may ask you questions about your:  Alcohol use.  Tobacco use.  Drug use.  Emotional well-being.  Home and relationship well-being.  Sexual activity.  Eating habits.  History of falls.  Memory and ability to understand (cognition).  Work and work Statistician.  Reproductive health. Screening  You may have the following tests or measurements:  Height, weight, and BMI.  Blood pressure.  Lipid and cholesterol levels. These may be checked every 5 years, or more frequently if you are over 40 years old.  Skin check.  Lung cancer screening. You may have this screening every year starting at age 56 if you have a 30-pack-year history of smoking and currently smoke or have quit within the past 15 years.  Fecal occult blood test (FOBT) of the stool. You may have this test every year starting at age 63.  Flexible sigmoidoscopy or colonoscopy. You may have a sigmoidoscopy every 5 years or a colonoscopy every 10 years starting at age 80.  Hepatitis C blood test.  Hepatitis B blood test.  Sexually transmitted disease (STD) testing.  Diabetes screening. This is done by checking your blood sugar (glucose) after you have not eaten for a while (fasting). You may have this done every 1-3 years.  Bone density scan. This is done to screen for osteoporosis. You may have this done starting at age 13.  Mammogram. This may be done every 1-2 years. Talk to your health care provider about how often you should have regular mammograms. Talk with your health care provider about your test results, treatment options, and if necessary, the need for more tests. Vaccines  Your health care  provider may recommend certain vaccines, such as:  Influenza vaccine. This is recommended every year.  Tetanus, diphtheria, and acellular pertussis (Tdap, Td) vaccine. You may need a Td booster every 10 years.  Zoster vaccine. You may need this after age 47.  Pneumococcal 13-valent  conjugate (PCV13) vaccine. One dose is recommended after age 31.  Pneumococcal polysaccharide (PPSV23) vaccine. One dose is recommended after age 50. Talk to your health care provider about which screenings and vaccines you need and how often you need them. This information is not intended to replace advice given to you by your health care provider. Make sure you discuss any questions you have with your health care provider. Document Released: 09/25/2015 Document Revised: 05/18/2016 Document Reviewed: 06/30/2015 Elsevier Interactive Patient Education  2017 [place] Prevention in the Home Falls can cause injuries. They can happen to people of all ages. There are many things you can do to make your home safe and to help prevent falls. What can I do on the outside of my home?  Regularly fix the edges of walkways and driveways and fix any cracks.  Remove anything that might make you trip as you walk through a door, such as a raised step or threshold.  Trim any bushes or trees on the path to your home.  Use bright outdoor lighting.  Clear any walking paths of anything that might make someone trip, such as rocks or tools.  Regularly check to see if handrails are loose or broken. Make sure that both sides of any steps have handrails.  Any raised decks and porches should have guardrails on the edges.  Have any leaves, snow, or ice cleared regularly.  Use sand or salt on walking paths during winter.  Clean up any spills in your garage right away. This includes oil or grease spills. What can I do in the bathroom?  Use night lights.  Install grab bars by the toilet and in the tub and shower. Do not use towel bars as grab bars.  Use non-skid mats or decals in the tub or shower.  If you need to sit down in the shower, use a plastic, non-slip stool.  Keep the floor dry. Clean up any water that spills on the floor as soon as it happens.  Remove soap buildup in the tub or  shower regularly.  Attach bath mats securely with double-sided non-slip rug tape.  Do not have throw rugs and other things on the floor that can make you trip. What can I do in the bedroom?  Use night lights.  Make sure that you have a light by your bed that is easy to reach.  Do not use any sheets or blankets that are too big for your bed. They should not hang down onto the floor.  Have a firm chair that has side arms. You can use this for support while you get dressed.  Do not have throw rugs and other things on the floor that can make you trip. What can I do in the kitchen?  Clean up any spills right away.  Avoid walking on wet floors.  Keep items that you use a lot in easy-to-reach places.  If you need to reach something above you, use a strong step stool that has a grab bar.  Keep electrical cords out of the way.  Do not use floor polish or wax that makes floors slippery. If you must use wax, use non-skid floor wax.  Do not have throw  rugs and other things on the floor that can make you trip. What can I do with my stairs?  Do not leave any items on the stairs.  Make sure that there are handrails on both sides of the stairs and use them. Fix handrails that are broken or loose. Make sure that handrails are as long as the stairways.  Check any carpeting to make sure that it is firmly attached to the stairs. Fix any carpet that is loose or worn.  Avoid having throw rugs at the top or bottom of the stairs. If you do have throw rugs, attach them to the floor with carpet tape.  Make sure that you have a light switch at the top of the stairs and the bottom of the stairs. If you do not have them, ask someone to add them for you. What else can I do to help prevent falls?  Wear shoes that:  Do not have high heels.  Have rubber bottoms.  Are comfortable and fit you well.  Are closed at the toe. Do not wear sandals.  If you use a stepladder:  Make sure that it is fully  opened. Do not climb a closed stepladder.  Make sure that both sides of the stepladder are locked into place.  Ask someone to hold it for you, if possible.  Clearly mark and make sure that you can see:  Any grab bars or handrails.  First and last steps.  Where the edge of each step is.  Use tools that help you move around (mobility aids) if they are needed. These include:  Canes.  Walkers.  Scooters.  Crutches.  Turn on the lights when you go into a dark area. Replace any light bulbs as soon as they burn out.  Set up your furniture so you have a clear path. Avoid moving your furniture around.  If any of your floors are uneven, fix them.  If there are any pets around you, be aware of where they are.  Review your medicines with your doctor. Some medicines can make you feel dizzy. This can increase your chance of falling. Ask your doctor what other things that you can do to help prevent falls. This information is not intended to replace advice given to you by your health care provider. Make sure you discuss any questions you have with your health care provider. Document Released: 06/25/2009 Document Revised: 02/04/2016 Document Reviewed: 10/03/2014 Elsevier Interactive Patient Education  2017 [employer].

## 2019-05-15 ENCOUNTER — Encounter: Payer: Self-pay | Admitting: Family Medicine

## 2019-05-15 ENCOUNTER — Other Ambulatory Visit: Payer: Self-pay

## 2019-05-15 ENCOUNTER — Ambulatory Visit (INDEPENDENT_AMBULATORY_CARE_PROVIDER_SITE_OTHER): Payer: Medicare HMO | Admitting: Family Medicine

## 2019-05-15 VITALS — BP 122/60 | HR 54 | Temp 97.3°F | Resp 16 | Wt 209.0 lb

## 2019-05-15 DIAGNOSIS — L255 Unspecified contact dermatitis due to plants, except food: Secondary | ICD-10-CM

## 2019-05-15 MED ORDER — PREDNISONE 10 MG PO TABS: ORAL_TABLET | ORAL | 0 refills | Status: AC

## 2019-05-15 NOTE — Progress Notes (Signed)
Patient: Rebecca Patton Female    DOB: September 09, 1946   73 y.o.   MRN: [id number] Visit Date: 05/15/2019  Today's Provider: [name], MD   Chief Complaint  Patient presents with  . Rash    x 4 days ago   Subjective:     Rash This is a new problem. Episode onset: 4 days ago. The problem has been gradually worsening since onset. The affected locations include the face, head, neck, left arm, right arm and left lower leg. The rash is characterized by redness, swelling and itchiness. She was exposed to nothing. Pertinent negatives include no fatigue, fever, shortness of breath or vomiting. Treatments tried: Benadryl. The treatment provided mild relief.  Patient admits to swimming in the pool at her gym on Saturday.   No Known Allergies   Current Outpatient Medications:  .  aspirin 81 MG tablet, Take 81 mg by mouth. , Disp: , Rfl:  .  hydrochlorothiazide (HYDRODIURIL) 25 MG tablet, TAKE 1 TABLET DAILY, Disp: 90 tablet, Rfl: 3 .  levothyroxine (SYNTHROID, LEVOTHROID) 50 MCG tablet, TAKE 1 TABLET DAILY BEFORE BREAKFAST, Disp: 90 tablet, Rfl: 3 .  metoprolol succinate (TOPROL-XL) 25 MG 24 hr tablet, TAKE 1 TABLET DAILY . TAKE WITH OR IMMEDIATELY        FOLLOWING A MEAL, Disp: 90 tablet, Rfl: 3 .  MULTIPLE VITAMIN PO, Take by mouth., Disp: , Rfl:  .  OMEGA-3 FATTY ACIDS PO, Take 1,040 mg by mouth daily. , Disp: , Rfl:  .  omeprazole (PRILOSEC) 20 MG capsule, TAKE 1 CAPSULE DAILY, Disp: 90 capsule, Rfl: 1 .  quinapril (ACCUPRIL) 40 MG tablet, TAKE 1 TABLET DAILY, Disp: 90 tablet, Rfl: 3 .  sertraline (ZOLOFT) 50 MG tablet, Take 1 tablet (50 mg total) by mouth daily., Disp: 90 tablet, Rfl: 3 .  traMADol (ULTRAM) 50 MG tablet, , Disp: , Rfl:  .  vitamin B-12 (CYANOCOBALAMIN) 100 MCG tablet, Take 200 mcg by mouth daily., Disp: , Rfl:   Review of Systems  Constitutional: Negative for appetite change, chills, fatigue and fever.  Respiratory: Negative for chest tightness and shortness  of breath.   Cardiovascular: Negative for chest pain and palpitations.  Gastrointestinal: Negative for abdominal pain, nausea and vomiting.  Skin: Positive for rash.       Itchy skin  Neurological: Negative for dizziness and weakness.    Social History   Tobacco Use  . Smoking status: Former Smoker    Quit date: 09/11/1997    Years since quitting: 21.6  . Smokeless tobacco: Never Used  Substance Use Topics  . Alcohol use: No    Alcohol/week: 0.0 standard drinks      Objective:   BP 122/60 (BP Location: Right Arm, Patient Position: Sitting, Cuff Size: Large)   Pulse (!) 54   Temp (!) 97.3 F (36.3 C) (Temporal)   Resp 16   Wt 209 lb (94.8 kg)   SpO2 95% Comment: room air  BMI 34.78 kg/m  Vitals:   05/15/19 1340  BP: 122/60  Pulse: (!) 54  Resp: 16  Temp: (!) 97.3 F (36.3 C)  TempSrc: Temporal  SpO2: 95%  Weight: 209 lb (94.8 kg)  Body mass index is 34.78 kg/m.   Physical Exam Vitals signs reviewed.  Constitutional:      Appearance: She is well-developed.  HENT:     Head: Normocephalic and atraumatic.     Right Ear: External ear normal.     Left  Ear: External ear normal.     Nose: Nose normal.  Eyes:     General: No scleral icterus.    Conjunctiva/sclera: Conjunctivae normal.  Neck:     Thyroid: No thyromegaly.  Cardiovascular:     Rate and Rhythm: Normal rate and regular rhythm.     Heart sounds: Normal heart sounds.  Pulmonary:     Effort: Pulmonary effort is normal.     Breath sounds: Normal breath sounds.  Abdominal:     General: There is no distension.     Palpations: Abdomen is soft.     Tenderness: There is no abdominal tenderness.  Skin:    General: Skin is warm and dry.     Comments: Rash c/w rhus dermatitis  Neurological:     General: No focal deficit present.     Mental Status: She is alert and oriented to person, place, and time.  Psychiatric:        Mood and Affect: Mood normal.        Behavior: Behavior normal.        Thought  Content: Thought content normal.        Judgment: Judgment normal.      No results found for any visits on 05/15/19.     Assessment & Plan    1. Rhus dermatitis  - predniSONE (DELTASONE) 10 MG tablet; Take 6 tablets (60 mg total) by mouth daily with breakfast for 1 day, THEN 5 tablets (50 mg total) daily with breakfast for 1 day, THEN 4 tablets (40 mg total) daily with breakfast for 1 day, THEN 3 tablets (30 mg total) daily with breakfast for 1 day, THEN 2 tablets (20 mg total) daily with breakfast for 1 day, THEN 1 tablet (10 mg total) daily with breakfast for 1 day.  Dispense: 21 tablet; Refill: 0     Rebecca Patton Mon, MD  [facility]

## 2019-05-15 NOTE — Patient Instructions (Signed)
Recommend OTC topical Caladryl and Benadryl

## 2019-05-22 DIAGNOSIS — R69 Illness, unspecified: Secondary | ICD-10-CM | POA: Diagnosis not present

## 2019-06-18 ENCOUNTER — Other Ambulatory Visit: Payer: Self-pay | Admitting: Family Medicine

## 2019-06-18 DIAGNOSIS — K219 Gastro-esophageal reflux disease without esophagitis: Secondary | ICD-10-CM

## 2019-06-29 DIAGNOSIS — R69 Illness, unspecified: Secondary | ICD-10-CM | POA: Diagnosis not present

## 2019-07-08 DIAGNOSIS — H401131 Primary open-angle glaucoma, bilateral, mild stage: Secondary | ICD-10-CM | POA: Diagnosis not present

## 2019-07-08 DIAGNOSIS — H40019 Open angle with borderline findings, low risk, unspecified eye: Secondary | ICD-10-CM | POA: Diagnosis not present

## 2019-07-08 DIAGNOSIS — H2510 Age-related nuclear cataract, unspecified eye: Secondary | ICD-10-CM | POA: Diagnosis not present

## 2019-07-10 ENCOUNTER — Other Ambulatory Visit: Payer: Self-pay | Admitting: Obstetrics and Gynecology

## 2019-07-10 ENCOUNTER — Other Ambulatory Visit: Payer: Self-pay

## 2019-07-10 ENCOUNTER — Ambulatory Visit
Admission: RE | Admit: 2019-07-10 | Discharge: 2019-07-10 | Disposition: A | Discharge status: Home / Self Care | Payer: Medicare HMO | Source: Ambulatory Visit | Attending: Obstetrics and Gynecology | Admitting: Obstetrics and Gynecology

## 2019-07-10 DIAGNOSIS — R928 Other abnormal and inconclusive findings on diagnostic imaging of breast: Secondary | ICD-10-CM

## 2019-07-10 DIAGNOSIS — Z1231 Encounter for screening mammogram for malignant neoplasm of breast: Secondary | ICD-10-CM

## 2019-07-10 DIAGNOSIS — N6310 Unspecified lump in the right breast, unspecified quadrant: Secondary | ICD-10-CM

## 2019-07-18 ENCOUNTER — Ambulatory Visit
Admission: RE | Admit: 2019-07-18 | Discharge: 2019-07-18 | Disposition: A | Discharge status: Home / Self Care | Payer: Medicare HMO | Source: Ambulatory Visit | Attending: Obstetrics and Gynecology | Admitting: Obstetrics and Gynecology

## 2019-07-18 DIAGNOSIS — R928 Other abnormal and inconclusive findings on diagnostic imaging of breast: Secondary | ICD-10-CM

## 2019-07-18 DIAGNOSIS — N6011 Diffuse cystic mastopathy of right breast: Secondary | ICD-10-CM | POA: Diagnosis not present

## 2019-07-18 DIAGNOSIS — N6310 Unspecified lump in the right breast, unspecified quadrant: Secondary | ICD-10-CM | POA: Diagnosis not present

## 2019-07-30 ENCOUNTER — Other Ambulatory Visit: Payer: Self-pay | Admitting: Family Medicine

## 2019-07-30 ENCOUNTER — Other Ambulatory Visit (HOSPITAL_COMMUNITY): Payer: Self-pay | Admitting: Family Medicine

## 2019-07-30 DIAGNOSIS — R222 Localized swelling, mass and lump, trunk: Secondary | ICD-10-CM

## 2019-07-30 DIAGNOSIS — K439 Ventral hernia without obstruction or gangrene: Secondary | ICD-10-CM | POA: Diagnosis not present

## 2019-08-02 ENCOUNTER — Other Ambulatory Visit: Payer: Medicare HMO

## 2019-08-05 ENCOUNTER — Ambulatory Visit
Admission: RE | Admit: 2019-08-05 | Discharge: 2019-08-05 | Disposition: A | Discharge status: Home / Self Care | Payer: Medicare HMO | Source: Ambulatory Visit | Attending: Family Medicine | Admitting: Family Medicine

## 2019-08-05 ENCOUNTER — Other Ambulatory Visit: Payer: Self-pay

## 2019-08-05 ENCOUNTER — Ambulatory Visit: Payer: Medicare HMO

## 2019-08-05 DIAGNOSIS — K439 Ventral hernia without obstruction or gangrene: Secondary | ICD-10-CM | POA: Insufficient documentation

## 2019-08-05 DIAGNOSIS — R222 Localized swelling, mass and lump, trunk: Secondary | ICD-10-CM

## 2019-08-05 DIAGNOSIS — K429 Umbilical hernia without obstruction or gangrene: Secondary | ICD-10-CM | POA: Diagnosis not present

## 2019-08-13 ENCOUNTER — Ambulatory Visit: Payer: Medicare HMO | Admitting: Surgery

## 2019-08-13 ENCOUNTER — Other Ambulatory Visit: Payer: Self-pay

## 2019-08-13 ENCOUNTER — Encounter: Payer: Self-pay | Admitting: Surgery

## 2019-08-13 DIAGNOSIS — R1900 Intra-abdominal and pelvic swelling, mass and lump, unspecified site: Secondary | ICD-10-CM | POA: Diagnosis not present

## 2019-08-13 NOTE — Progress Notes (Signed)
Patient ID: Rebecca Patton, female   DOB: 1946-03-19, 73 y.o.   MRN: [id number]  Chief Complaint: Prior hernia site pain  History of Present Illness Rebecca Patton is a 73 y.o. female with history of epigastric hernia repair with mesh, slightly over a year ago.  7-8 months ago last spring, she went to assist a friend who was falling/stumbling and elicited a pain of her upper abdomen.  Localized to the site of her prior hernia this has persisted until now.  She senses a mass in the region, associated tenderness.  No distinct bulge that changes with coughing or sneezing.  She does not lift much to stress the region.  She senses a fullness in the epigastrium to the xiphoid.  Past Medical History Past Medical History:  Diagnosis Date  . Arthritis   . Depression   . Dysrhythmia    ST  . High blood pressure   . Hypothyroidism   . MRSA infection 2006   on nose  . Uterine cancer Rebecca Patton)       Past Surgical History:  Procedure Laterality Date  . ABDOMINAL HYSTERECTOMY  2009    BSO  . BUNIONECTOMY    . EXCISION NEUROMA    . INSERTION OF MESH N/A 05/18/2018   Procedure: INSERTION OF MESH;  Surgeon: Vickie Epley, MD;  Location: [facility] ORS;  Service: General;  Laterality: N/A;  . NASAL SINUS SURGERY    . TUBAL LIGATION    . UMBILICAL HERNIA REPAIR N/A 05/18/2018   Procedure: HERNIA REPAIR SUPRA-UMBILICAL ADULT;  Surgeon: Vickie Epley, MD;  Location: [facility] ORS;  Service: General;  Laterality: N/A;    No Known Allergies  Current Outpatient Medications  Medication Sig Dispense Refill  . aspirin 81 MG tablet Take 81 mg by mouth.     . hydrochlorothiazide (HYDRODIURIL) 25 MG tablet TAKE 1 TABLET DAILY 90 tablet 3  . levothyroxine (SYNTHROID, LEVOTHROID) 50 MCG tablet TAKE 1 TABLET DAILY BEFORE BREAKFAST 90 tablet 3  . metoprolol succinate (TOPROL-XL) 25 MG 24 hr tablet TAKE 1 TABLET DAILY . TAKE WITH OR IMMEDIATELY        FOLLOWING A MEAL 90 tablet 3  . MULTIPLE VITAMIN PO Take by mouth.     . OMEGA-3 FATTY ACIDS PO Take 1,040 mg by mouth daily.     Marland Kitchen omeprazole (PRILOSEC) 20 MG capsule TAKE 1 CAPSULE DAILY 90 capsule 1  . quinapril (ACCUPRIL) 40 MG tablet TAKE 1 TABLET DAILY 90 tablet 3  . sertraline (ZOLOFT) 50 MG tablet Take 1 tablet (50 mg total) by mouth daily. 90 tablet 3  . vitamin B-12 (CYANOCOBALAMIN) 100 MCG tablet Take 200 mcg by mouth daily.     No current facility-administered medications for this visit.     Family History Family History  Problem Relation Age of Onset  . Parkinson's disease Mother   . Diabetes Father   . CAD Father   . Cancer Sister        squamous cell cancer in her eye  . Pancreatic cancer Brother   . Tremor Brother   . Breast cancer Neg Hx       Social History Social History   Tobacco Use  . Smoking status: Former Smoker    Quit date: 09/11/1997    Years since quitting: 21.9  . Smokeless tobacco: Never Used  Substance Use Topics  . Alcohol use: No    Alcohol/week: 0.0 standard drinks  . Drug use: No  Review of Systems  All other systems reviewed and are negative.     Physical Exam Blood pressure (!) 142/70, pulse 89, temperature (!) 97.5 F (36.4 C), resp. rate 14, height 5\' 6"  (1.676 m), weight 209 lb (94.8 kg). Last Weight  Most recent update: 08/13/2019  1:59 PM   Weight  94.8 kg (209 lb)            CONSTITUTIONAL: Well developed, and nourished, appropriately responsive and aware without distress.   EYES: Sclera non-icteric.   EARS, NOSE, MOUTH AND THROAT: Mask worn.   Hearing is intact to voice.  NECK: Trachea is midline, and there is no jugular venous distension.  LYMPH NODES:  Lymph nodes in the neck are not enlarged. RESPIRATORY:  Lungs are clear, and breath sounds are equal bilaterally. Normal respiratory effort without pathologic use of accessory muscles. CARDIOVASCULAR: Heart is regular in rate and rhythm. GI: The abdomen is soft, nontender, and nondistended. There were no palpable masses,  there is an increased density to the cephalad extent of her midline epigastric scar which is tender to touch, but I did not appreciate any fascial defect, nor was it a mass I felt could be reducible.  She may have a mild diastases.  I did not appreciate hepatosplenomegaly. There were normal bowel sounds. MUSCULOSKELETAL:  Symmetrical muscle tone appreciated in all four extremities.    SKIN: Skin turgor is normal. No pathologic skin lesions appreciated.  NEUROLOGIC:  Motor and sensation appear grossly normal.  Cranial nerves are grossly without defect. PSYCH:  Alert and oriented to person, place and time. Affect is appropriate for situation.  Data Reviewed I have personally reviewed what is currently available of the patient's imaging, recent labs and medical records.    I reviewed the ultrasound imaging with the patient present. Assessment    Based on clinical examination, I feel that her mesh is still intact without adjacent development of new herniation.  However due to the persistence of her this patient's pain following, will proceed with additional evaluation to ensure that there is nothing we can do to help her with her pain. Patient Active Problem List   Diagnosis Date Noted  . Incisional hernia, without obstruction or gangrene   . Supraumbilical hernia [id number]  . Anxiety 03/20/2015  . Arthritis 03/20/2015  . 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 Clinical depression 03/20/2015  . Malignant neoplasm of corpus uteri, except isthmus ([place]) 03/20/2015  . Essential (primary) hypertension 03/20/2015  . HLD (hyperlipidemia) 03/20/2015  . Cannot sleep 03/20/2015  . Osteopenia 03/20/2015  . Adult hypothyroidism 03/20/2015    Plan    CT abdomen with contrast to evaluate source of abdominal wall pain following prior hernia surgery with mesh. Follow-up after.  Face-to-face time spent with the patient and accompanying care providers(if present) was 30 minutes, with more than 50% of the  time spent counseling, educating, and coordinating care of the patient.      Ronny Bacon 08/13/2019, 3:37 PM

## 2019-08-13 NOTE — Patient Instructions (Addendum)
We will schedule you for a CT abdomen with contrast. Dr Christian Mate will call you with your results.   We have scheduled you for a CT of the abdomen with contrast at [facility] on 08/27/19 at 2:30 pm. You will need to arrive there by 2:00 pm and have nothing to eat or drink for 4 hours prior. You will need to pick up a prep kit a few days prior.

## 2019-08-27 ENCOUNTER — Other Ambulatory Visit: Payer: Self-pay

## 2019-08-27 ENCOUNTER — Ambulatory Visit
Admission: RE | Admit: 2019-08-27 | Discharge: 2019-08-27 | Disposition: A | Discharge status: Home / Self Care | Payer: Medicare HMO | Source: Ambulatory Visit | Attending: Surgery | Admitting: Surgery

## 2019-08-27 DIAGNOSIS — K8020 Calculus of gallbladder without cholecystitis without obstruction: Secondary | ICD-10-CM | POA: Diagnosis not present

## 2019-08-27 DIAGNOSIS — R1900 Intra-abdominal and pelvic swelling, mass and lump, unspecified site: Secondary | ICD-10-CM | POA: Insufficient documentation

## 2019-08-27 LAB — POCT I-STAT CREATININE: Creatinine, Ser: 0.8 mg/dL (ref 0.44–1.00)

## 2019-08-27 MED ORDER — IOHEXOL 300 MG/ML  SOLN
100.0000 mL | Freq: Once | INTRAMUSCULAR | Status: AC | PRN
  Administered 2019-08-27: 100 mL via INTRAVENOUS

## 2019-09-30 DIAGNOSIS — R69 Illness, unspecified: Secondary | ICD-10-CM | POA: Diagnosis not present

## 2019-10-14 ENCOUNTER — Ambulatory Visit (INDEPENDENT_AMBULATORY_CARE_PROVIDER_SITE_OTHER): Payer: Medicare HMO | Admitting: Physician Assistant

## 2019-10-14 ENCOUNTER — Telehealth: Payer: Self-pay

## 2019-10-14 DIAGNOSIS — R197 Diarrhea, unspecified: Secondary | ICD-10-CM | POA: Diagnosis not present

## 2019-10-14 DIAGNOSIS — R1110 Vomiting, unspecified: Secondary | ICD-10-CM

## 2019-10-14 DIAGNOSIS — R1013 Epigastric pain: Secondary | ICD-10-CM

## 2019-10-14 DIAGNOSIS — Z8719 Personal history of other diseases of the digestive system: Secondary | ICD-10-CM | POA: Diagnosis not present

## 2019-10-14 DIAGNOSIS — K8020 Calculus of gallbladder without cholecystitis without obstruction: Secondary | ICD-10-CM

## 2019-10-14 MED ORDER — ONDANSETRON HCL 4 MG PO TABS: 4.0000 mg | ORAL_TABLET | Freq: Three times a day (TID) | ORAL | 0 refills | Status: DC | PRN

## 2019-10-14 NOTE — Progress Notes (Signed)
Patient: Rebecca Patton Female    DOB: Jan 02, 1946   74 y.o.   MRN: [id number] Visit Date: 10/14/2019  Today's Provider: Trinna Post, PA-C   Chief Complaint  Patient presents with  . Abdominal Pain   Subjective:    Virtual Visit via Telephone Note  I connected with [name] on 10/14/19 at  2:20 PM EST by telephone and verified that I am speaking with the correct person using two identifiers.  Location: Patient: Home Provider: Office   I discussed the limitations, risks, security and privacy concerns of performing an evaluation and management service by telephone and the availability of in person appointments. I also discussed with the patient that there may be a patient responsible charge related to this service. The patient expressed understanding and agreed to proceed.  Abdominal Pain This is a recurrent problem. The current episode started 1 to 4 weeks ago. The problem occurs intermittently. The problem has been gradually worsening. The pain is located in the RLQ, LLQ and generalized abdominal region. The pain is at a severity of 6/10. The pain is moderate. The quality of the pain is burning and cramping. The abdominal pain radiates to the back. Associated symptoms include diarrhea, nausea and vomiting. Pertinent negatives include no fever or headaches. The pain is aggravated by deep breathing. She has tried nothing for the symptoms. The treatment provided no relief.   She has a history of hernia repair surgery in 2019 and she had a CT abdominal scan performed on 08/27/2019 to assess new mass thought to be secondary to previous surgery. CT scan showed cholelithiasis. She reports abdominal pain ongoing for 5-6 months. She reports she has pain three inches above her naval that radiates to her back. She reports this hurts on and off all the time. Got worse this morning when she felt poorly. She reports vomiting and diarrhea. No measured fevers and she feels chilly. She has an  appendix. She does not have a history of diverticulitis. She had some cereal and coffee this morning. Vomited four times and had 5 episodes of diarrhea this day.     No Known Allergies   Current Outpatient Medications:  .  aspirin 81 MG tablet, Take 81 mg by mouth. , Disp: , Rfl:  .  hydrochlorothiazide (HYDRODIURIL) 25 MG tablet, TAKE 1 TABLET DAILY, Disp: 90 tablet, Rfl: 3 .  levothyroxine (SYNTHROID, LEVOTHROID) 50 MCG tablet, TAKE 1 TABLET DAILY BEFORE BREAKFAST, Disp: 90 tablet, Rfl: 3 .  metoprolol succinate (TOPROL-XL) 25 MG 24 hr tablet, TAKE 1 TABLET DAILY . TAKE WITH OR IMMEDIATELY        FOLLOWING A MEAL, Disp: 90 tablet, Rfl: 3 .  MULTIPLE VITAMIN PO, Take by mouth., Disp: , Rfl:  .  OMEGA-3 FATTY ACIDS PO, Take 1,040 mg by mouth daily. , Disp: , Rfl:  .  omeprazole (PRILOSEC) 20 MG capsule, TAKE 1 CAPSULE DAILY, Disp: 90 capsule, Rfl: 1 .  quinapril (ACCUPRIL) 40 MG tablet, TAKE 1 TABLET DAILY, Disp: 90 tablet, Rfl: 3 .  sertraline (ZOLOFT) 50 MG tablet, Take 1 tablet (50 mg total) by mouth daily., Disp: 90 tablet, Rfl: 3 .  vitamin B-12 (CYANOCOBALAMIN) 100 MCG tablet, Take 200 mcg by mouth daily., Disp: , Rfl:   Review of Systems  Constitutional: Negative for chills and fever.  HENT: Negative for congestion, rhinorrhea, sinus pressure, sinus pain and sore throat.   Respiratory: Negative for cough and wheezing.   Gastrointestinal: Positive  for abdominal pain, diarrhea, nausea and vomiting.  Neurological: Negative for headaches.    Social History   Tobacco Use  . Smoking status: Former Smoker    Quit date: 09/11/1997    Years since quitting: 22.1  . Smokeless tobacco: Never Used  Substance Use Topics  . Alcohol use: No    Alcohol/week: 0.0 standard drinks      Objective:   There were no vitals taken for this visit. There were no vitals filed for this visit.There is no height or weight on file to calculate BMI.   Physical Exam   No results found for any  visits on 10/14/19.     Assessment & Plan    1. Epigastric pain  Patient with a history of cholelithiasis presenting today with worsening abdominal pain radiating to her back, vomiting, diarrhea, and chills. DDx: cholelithiasis, cholecystitis, appendicitis, diverticulitis or other colitis, dehydration, viral gastroenteritis, COVID 29.    I have counseled patient that she has a number of potential serious illnesses that could be affecting her currently. I have counseled her that the ER would be the most comprehensive and streamlined workup of the differential. I have counseled that I can order imaging abd bloodwork as an outpatient but this work up may be incomplete, piecemeal and delay care .Patient expresses understanding. I called ahead to the ER.   Patient says the ER has a three hour wait and she does not want to go. She wants nausea medication called in. Patient has been counseled on delays in care. She wants to pursue workup outpatient. I have ordered stat RUQ [place] to evaluate for cholecystitis and STAT labs which she may get at [facility]. She has been advised to schedule a COVID 19 test with [facility].   2. Vomiting and diarrhea   I discussed the assessment and treatment plan with the patient. The patient was provided an opportunity to ask questions and all were answered. The patient agreed with the plan and demonstrated an understanding of the instructions.   The patient was advised to call back or seek an in-person evaluation if the symptoms worsen or if the condition fails to improve as anticipated.      Trinna Post, PA-C  [facility]

## 2019-10-14 NOTE — Telephone Encounter (Signed)
Patient was advised. Patient states that she would like for you to go ahead and set up the referrals. Patient states that she can not stay off of the toilet long enough to be waiting in the ER and they have a 3 hour wait. Patient states that she is also waiting for her friend COVID results to come back to see if she may just need to get a COVID test done. Patient states that she is aware of the risks and delay in with care. FYI

## 2019-10-14 NOTE — Telephone Encounter (Signed)
Copied from [place] [id number]. Topic: General - Other >> Oct 14, 2019  3:40 PM Greggory Keen D wrote: Reason for CRM: pt called back after her phone visit saying she did not wan to go tot he ER.  She ask if Fabio Bering can all in something for her diarrhea and also Zofran  for her throwing up.  [facility]   CB#  [phone]

## 2019-10-14 NOTE — Telephone Encounter (Signed)
Nausea medication sent in. Patient has been advised about risks/delays in care etc.

## 2019-10-15 ENCOUNTER — Encounter: Payer: Self-pay | Admitting: Emergency Medicine

## 2019-10-15 ENCOUNTER — Ambulatory Visit
Admission: RE | Admit: 2019-10-15 | Discharge: 2019-10-15 | Disposition: A | Discharge status: Home / Self Care | Payer: Medicare HMO | Source: Ambulatory Visit | Attending: Physician Assistant | Admitting: Physician Assistant

## 2019-10-15 ENCOUNTER — Other Ambulatory Visit: Payer: Self-pay

## 2019-10-15 ENCOUNTER — Ambulatory Visit
Admission: EM | Admit: 2019-10-15 | Discharge: 2019-10-15 | Disposition: A | Discharge status: Home / Self Care | Payer: Medicare HMO | Attending: Urgent Care | Admitting: Urgent Care

## 2019-10-15 ENCOUNTER — Telehealth: Payer: Self-pay

## 2019-10-15 DIAGNOSIS — R1110 Vomiting, unspecified: Secondary | ICD-10-CM

## 2019-10-15 DIAGNOSIS — R5383 Other fatigue: Secondary | ICD-10-CM | POA: Diagnosis not present

## 2019-10-15 DIAGNOSIS — R1112 Projectile vomiting: Secondary | ICD-10-CM

## 2019-10-15 DIAGNOSIS — R197 Diarrhea, unspecified: Secondary | ICD-10-CM

## 2019-10-15 DIAGNOSIS — R1013 Epigastric pain: Secondary | ICD-10-CM

## 2019-10-15 DIAGNOSIS — R05 Cough: Secondary | ICD-10-CM | POA: Diagnosis not present

## 2019-10-15 DIAGNOSIS — R6883 Chills (without fever): Secondary | ICD-10-CM

## 2019-10-15 DIAGNOSIS — M791 Myalgia, unspecified site: Secondary | ICD-10-CM | POA: Diagnosis not present

## 2019-10-15 DIAGNOSIS — Z20822 Contact with and (suspected) exposure to covid-19: Secondary | ICD-10-CM | POA: Diagnosis not present

## 2019-10-15 DIAGNOSIS — K760 Fatty (change of) liver, not elsewhere classified: Secondary | ICD-10-CM | POA: Diagnosis not present

## 2019-10-15 DIAGNOSIS — K8020 Calculus of gallbladder without cholecystitis without obstruction: Secondary | ICD-10-CM | POA: Insufficient documentation

## 2019-10-15 LAB — SARS CORONAVIRUS 2 AG (30 MIN TAT): SARS Coronavirus 2 Ag: NEGATIVE

## 2019-10-15 LAB — INFLUENZA PANEL BY PCR (TYPE A & B)
Influenza A By PCR: NEGATIVE
Influenza B By PCR: NEGATIVE

## 2019-10-15 NOTE — Telephone Encounter (Signed)
[email or web address]

## 2019-10-15 NOTE — Telephone Encounter (Signed)
Patient was advised. Patient have a scheduled [place] at 3:00 PM today.

## 2019-10-15 NOTE — ED Provider Notes (Addendum)
[name], [name]   Name: Rebecca Patton DOB: 1946/02/18 MRN: [id number] CSN: [id number] PCP: Jerrol Banana., MD  Arrival date and time:  10/15/19 1706  Chief Complaint:  Emesis, Cough, and Diarrhea   NOTE: Prior to seeing the patient today, I have reviewed the triage nursing documentation and vital signs. Clinical staff has updated patient's PMH/PSHx, current medication list, and drug allergies/intolerances to ensure comprehensive history available to assist in medical decision making.   History:   HPI: Rebecca Patton is a 74 y.o. female who presents today with complaints of fatigue, cough, congestion, chills, and diffuse body myalgias that started with acute onset yesterday. PMH (+) for frequent/recurrent sinus issues. Patient denies fevers. Cough has been non-productive with no associated shortness of breath or wheezing. Patient reports that she has had nausea, vomiting, and diarrhea. Her GI symptoms have improved with oral ondansetron and loperamide. She is able to eat and drink.  Patient denies any perceived alterations to her sense of taste or smell. Patient presents out of concerns for her personal health after being exposed to someoe who tested positive for SARS-CoV-2 (novel coronavirus) on 10/11/2019. No one else is her home has experienced a similar symptom constellation. She has never been tested for SARS-CoV-2 (novel coronavirus) in the past per her report. Patient has been vaccinated for influenza this season. Patient advising that she was sent to urgent care today to be tested for SARS-CoV-2 and influenza. In review of her records, I discovered that patient underwent ultrasound imaging today for epigastric pain; results reviewed. [place] (+) for cholelithiasis without sonographic evidence of cholecystitis; CBD measured at 2 mm. Patient with labs ordered by her PCP noted in the system. She does not wish to have those labs collected today while here citing the fact that she "does not feel up  to blood work today". Patient advising that she will leave her abdominal pain and lab work to her PCP and is here only for viral testing.   Past Medical History:  Diagnosis Date  . Arthritis   . Depression   . Dysrhythmia    ST  . High blood pressure   . Hypothyroidism   . MRSA infection 2006   on nose  . Uterine cancer ([place]) 2009    Past Surgical History:  Procedure Laterality Date  . ABDOMINAL HYSTERECTOMY  2009    BSO  . BUNIONECTOMY    . EXCISION NEUROMA    . INSERTION OF MESH N/A 05/18/2018   Procedure: INSERTION OF MESH;  Surgeon: Vickie Epley, MD;  Location: [facility] ORS;  Service: General;  Laterality: N/A;  . NASAL SINUS SURGERY    . TUBAL LIGATION    . UMBILICAL HERNIA REPAIR N/A 05/18/2018   Procedure: HERNIA REPAIR SUPRA-UMBILICAL ADULT;  Surgeon: Vickie Epley, MD;  Location: [facility] ORS;  Service: General;  Laterality: N/A;    Family History  Problem Relation Age of Onset  . Parkinson's disease Mother   . Diabetes Father   . CAD Father   . Cancer Sister        squamous cell cancer in her eye  . Pancreatic cancer Brother   . Tremor Brother   . Breast cancer Neg Hx     Social History   Tobacco Use  . Smoking status: Former Smoker    Quit date: 09/11/1997    Years since quitting: 22.1  . Smokeless tobacco: Never Used  Substance Use Topics  . Alcohol use: No    Alcohol/week:  0.0 standard drinks  . Drug use: No    Patient Active Problem List   Diagnosis Date Noted  . Incisional hernia, without obstruction or gangrene   . Supraumbilical hernia 46/28/6381  . Anxiety 03/20/2015  . Arthritis 03/20/2015  . 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 Clinical depression 03/20/2015  . Malignant neoplasm of corpus uteri, except isthmus ([place]) 03/20/2015  . Essential (primary) hypertension 03/20/2015  . HLD (hyperlipidemia) 03/20/2015  . Cannot sleep 03/20/2015  . Osteopenia 03/20/2015  . Adult hypothyroidism 03/20/2015    Home Medications:    Current  Meds  Medication Sig  . aspirin 81 MG tablet Take 81 mg by mouth.   . hydrochlorothiazide (HYDRODIURIL) 25 MG tablet TAKE 1 TABLET DAILY  . levothyroxine (SYNTHROID, LEVOTHROID) 50 MCG tablet TAKE 1 TABLET DAILY BEFORE BREAKFAST  . metoprolol succinate (TOPROL-XL) 25 MG 24 hr tablet TAKE 1 TABLET DAILY . TAKE WITH OR IMMEDIATELY        FOLLOWING A MEAL  . MULTIPLE VITAMIN PO Take by mouth.  . OMEGA-3 FATTY ACIDS PO Take 1,040 mg by mouth daily.   Marland Kitchen omeprazole (PRILOSEC) 20 MG capsule TAKE 1 CAPSULE DAILY  . ondansetron (ZOFRAN) 4 MG tablet Take 1 tablet (4 mg total) by mouth every 8 (eight) hours as needed for nausea or vomiting.  . quinapril (ACCUPRIL) 40 MG tablet TAKE 1 TABLET DAILY  . sertraline (ZOLOFT) 50 MG tablet Take 1 tablet (50 mg total) by mouth daily.  . vitamin B-12 (CYANOCOBALAMIN) 100 MCG tablet Take 200 mcg by mouth daily.    Allergies:   Patient has no known allergies.  Review of Systems (ROS): Review of Systems  Constitutional: Positive for chills and fatigue. Negative for fever.  HENT: Positive for congestion. Negative for ear pain, postnasal drip, rhinorrhea, sinus pressure, sinus pain, sneezing and sore throat.   Eyes: Negative for pain, discharge and redness.  Respiratory: Positive for cough. Negative for chest tightness and shortness of breath.   Cardiovascular: Negative for chest pain and palpitations.  Gastrointestinal: Positive for abdominal distention, diarrhea, nausea and vomiting. Negative for abdominal pain.  Musculoskeletal: Positive for myalgias. Negative for arthralgias, back pain and neck pain.  Skin: Negative for color change, pallor and rash.  Neurological: Negative for dizziness, syncope, weakness and headaches.  Hematological: Negative for adenopathy.     Vital Signs: Today's Vitals   10/15/19 1739 10/15/19 1742  BP:  138/69  Pulse:  64  Resp:  18  Temp:  98.5 F (36.9 C)  TempSrc:  Oral  SpO2:  95%  Weight: 200 lb (90.7 kg)   Height:  5' 6"  (1.676 m)   PainSc: 3      Physical Exam: Physical Exam  Constitutional: She is oriented to person, place, and time and well-developed, well-nourished, and in no distress.  Acutely ill appearing; fatigued/listless.  HENT:  Head: Normocephalic and atraumatic.  Nose: Nose normal.  Mouth/Throat: Uvula is midline, oropharynx is clear and moist and mucous membranes are normal.  Eyes: Pupils are equal, round, and reactive to light.  Cardiovascular: Normal rate, regular rhythm, normal heart sounds and intact distal pulses.  Pulmonary/Chest: Effort normal and breath sounds normal.  No cough noted in clinic. No SOB or increased WOB. No distress. Able to speak in complete sentences without difficulties. SPO2 95% (baseline) on RA.  Musculoskeletal:     Cervical back: Normal range of motion.  Neurological: She is alert and oriented to person, place, and time. Gait normal.  Skin: Skin  is warm and dry. No rash noted. She is not diaphoretic.  Psychiatric: Mood, memory, affect and judgment normal.  Nursing note and vitals reviewed.   Urgent Care Treatments / Results:   Orders Placed This Encounter  Procedures  . Novel Coronavirus, NAA (Hosp order, Send-out to Ref Lab; TAT 18-24 hrs  . SARS Coronavirus 2 Ag (30 min TAT) - Nasal Swab (BD Veritor Kit)  . Influenza panel by PCR (type A & B)  . Droplet precaution    LABS: PLEASE NOTE: all labs that were ordered this encounter are listed, however only abnormal results are displayed. Labs Reviewed  SARS CORONAVIRUS 2 AG (30 MIN TAT)  NOVEL CORONAVIRUS, NAA (HOSP ORDER, SEND-OUT TO REF LAB; TAT 18-24 HRS)  INFLUENZA PANEL BY PCR (TYPE A & B)    EKG: -None  RADIOLOGY: -None  PROCEDURES: Procedures  MEDICATIONS RECEIVED THIS VISIT: Medications - No data to display  PERTINENT CLINICAL COURSE NOTES/UPDATES:   Initial Impression / Assessment and Plan / Urgent Care Course:  Pertinent labs & imaging results that were available during  my care of the patient were personally reviewed by me and considered in my medical decision making (see lab/imaging section of note for values and interpretations).  Rebecca Patton is a 74 y.o. female who presents to [facility] today with complaints of Emesis, Cough, and Diarrhea  Patient acutely ill appearing (non-toxic) appearing in clinic today. She does not appear to be in any acute distress. Presenting symptoms (see HPI) and exam as documented above. She presents with symptoms associated with SARS-CoV-2 (novel coronavirus). Discussed typical symptom constellation. Reviewed potential for infection and need for testing. Patient amenable to being tested. She does not wish to wait in clinic for her test results today. She is asking for [place] to call her once the tests result. Rapid SARS-CoV-2 Ag swab collected by certified clinical staff; results negative. Given symptom constellation and exposure the decision was made to send more sensitive molecular PCR testing to further assess for the patient being infected with the SARS-CoV-2 virus. Discussed variable turn around times associated with testing, as swabs are being processed at [facility], and have been taking between 24-48 hours to come back. She was advised to self quarantine, per [facility] DHHS guidelines, until negative results received. These measures are being implemented out of an abundance of caution to prevent transmission and spread during the current SARS-CoV-2 pandemic.  Influenza diagnotic test (PCR) resulted (-) for both the influenza A and B Ag.Presenting symptoms consistent with acute viral illness. Until ruled out with confirmatory lab testing, SARS-CoV-2 remains part of the differential. Her testing is pending at this time. As her symptoms are felt to be viral in nature, antibiotics would not offer her any relief or improve his symptoms any faster than conservative symptomatic management. Discussed supportive care measures at home during acute  phase of illness. Patient to rest as much as possible. She was encouraged to ensure adequate hydration (water and ORS) to prevent dehydration and electrolyte derangements. Patient may use APAP and/or IBU on an as needed basis for pain/fever. Patient has sufficient supply of antiemetics at home to use for her nausea. She was encouraged to return a call to the clinic with any needs or worsening symptoms.   Discussed follow up with primary care physician in 1 week for re-evaluation. I have reviewed the follow up and strict return precautions for any new or worsening symptoms. Patient is aware of symptoms that would be deemed urgent/emergent, and  would thus require further evaluation either here or in the emergency department. At the time of discharge, she verbalized understanding and consent with the discharge plan as it was reviewed with her. All questions were fielded by provider and/or clinic staff prior to patient discharge.    Final Clinical Impressions / Urgent Care Diagnoses:   Final diagnoses:  Suspected COVID-19 virus infection  Exposure to COVID-19 virus  Encounter for laboratory testing for COVID-19 virus    New Prescriptions:  [place] Controlled Substance Registry consulted? Not Applicable  No orders of the defined types were placed in this encounter.   Recommended Follow up Care:  Patient encouraged to follow up with the following provider within the specified time frame, or sooner as dictated by the severity of her symptoms. As always, she was instructed that for any urgent/emergent care needs, she should seek care either here or in the emergency department for more immediate evaluation.  Follow-up Information    Jerrol Banana., MD In 1 week.   Specialty: Family Medicine Why: General reassessment of symptoms if not improving Contact information: [address] [place] [address] [phone]         NOTE: This note was prepared using Dragon dictation  software along with smaller phrase technology. Despite my best ability to proofread, there is the potential that transcriptional errors may still occur from this process, and are completely unintentional.      Karen Kitchens, NP 10/15/19 2315

## 2019-10-15 NOTE — Telephone Encounter (Signed)
Pt given ultrasound results per notes of Jeanell Sparrow PA-C on 10/15/19. Pt verbalized understanding. Pt stated that she is continuing to have abdominal pain but would like to 'think about it" before agreeing to another CT. Routing note to office.

## 2019-10-15 NOTE — ED Triage Notes (Addendum)
Pt c/o diarrhea, vomiting, body aches, cough chills, headache. Started yesterday morning. Pt states that PCP has ordered labs but pt does not feel up to having labs.

## 2019-10-15 NOTE — Telephone Encounter (Signed)
I ordered stat labwork which she should get at the medical mall at [facility]. I also ordered a stat US to look for gallbladder infection. She should schedule a COVID test with [facility]. Will contact pending results.

## 2019-10-15 NOTE — Telephone Encounter (Signed)
-----   Message from Trinna Post, [place] sent at 10/15/2019  3:38 PM EST ----- Ultrasound does not show infection of gallbladder. Again shows fatty liver which was present on CT scan in 08/2019. If she is feeling improved and her abdominal pain is improving, then she can observe. If it is worsening, might consider CT scan.

## 2019-10-15 NOTE — Discharge Instructions (Addendum)
It was very nice seeing you today in clinic. Thank you for entrusting me with your care.   Your flu and COVID testing are pending. Rest and Stay HYDRATED. Water and electrolyte containing beverages (Gatorade, Pedialyte) are best to prevent dehydration and electrolyte abnormalities. May use Tylenol and/or Ibuprofen as needed for pain/fever.  Make arrangements to follow up with your regular doctor in 1 week for re-evaluation if not improving. If your symptoms/condition worsens, please seek follow up care either here or in the ER. Please remember, our [place] providers are "right here with you" when you need [place].   Again, it was my pleasure to take care of you today. Thank you for choosing our clinic. I hope that you start to feel better quickly.   Honor Loh, MSN, APRN, FNP-C, CEN Advanced Practice Provider [facility]

## 2019-10-16 ENCOUNTER — Telehealth: Payer: Self-pay

## 2019-10-16 NOTE — Telephone Encounter (Signed)
Copied from [place] [id number]. Topic: General - Inquiry >> Oct 16, 2019  3:58 PM Percell Belt A wrote: Reason for CRM: pt called In and would like to know why there was blood work put in and what Provider is checking for ?   Best number [phone]

## 2019-10-17 ENCOUNTER — Telehealth: Payer: Self-pay

## 2019-10-17 DIAGNOSIS — R1013 Epigastric pain: Secondary | ICD-10-CM | POA: Diagnosis not present

## 2019-10-17 DIAGNOSIS — K8020 Calculus of gallbladder without cholecystitis without obstruction: Secondary | ICD-10-CM | POA: Diagnosis not present

## 2019-10-17 DIAGNOSIS — R1110 Vomiting, unspecified: Secondary | ICD-10-CM | POA: Diagnosis not present

## 2019-10-17 DIAGNOSIS — R197 Diarrhea, unspecified: Secondary | ICD-10-CM | POA: Diagnosis not present

## 2019-10-17 LAB — NOVEL CORONAVIRUS, NAA (HOSP ORDER, SEND-OUT TO REF LAB; TAT 18-24 HRS): SARS-CoV-2, NAA: NOT DETECTED

## 2019-10-17 NOTE — Telephone Encounter (Signed)
Copied from [place] [id number]. Topic: General - Other >> Oct 17, 2019 10:32 AM Rainey Pines A wrote: Patient stated that she will do the lab work and ct scan but would like someone to call her back before scheduling the ct scan to explain what type of lab work was ordered and what the doctor is looking for in regards to lab work and ct scan. Please advise

## 2019-10-18 LAB — COMPREHENSIVE METABOLIC PANEL
ALT: 25 IU/L (ref 0–32)
AST: 26 IU/L (ref 0–40)
Albumin/Globulin Ratio: 1.6 (ref 1.2–2.2)
Albumin: 4 g/dL (ref 3.7–4.7)
Alkaline Phosphatase: 75 IU/L (ref 39–117)
BUN/Creatinine Ratio: 16 (ref 12–28)
BUN: 15 mg/dL (ref 8–27)
Bilirubin Total: 0.3 mg/dL (ref 0.0–1.2)
CO2: 23 mmol/L (ref 20–29)
Calcium: 9.3 mg/dL (ref 8.7–10.3)
Chloride: 102 mmol/L (ref 96–106)
Creatinine, Ser: 0.92 mg/dL (ref 0.57–1.00)
GFR calc Af Amer: 72 mL/min/{1.73_m2} (ref >59)
GFR calc non Af Amer: 63 mL/min/{1.73_m2} (ref >59)
Globulin, Total: 2.5 g/dL (ref 1.5–4.5)
Glucose: 88 mg/dL (ref 65–99)
Potassium: 3.9 mmol/L (ref 3.5–5.2)
Sodium: 141 mmol/L (ref 134–144)
Total Protein: 6.5 g/dL (ref 6.0–8.5)

## 2019-10-18 LAB — CBC WITH DIFFERENTIAL
Basophils Absolute: 0 10*3/uL (ref 0.0–0.2)
Basos: 0 %
EOS (ABSOLUTE): 0.2 10*3/uL (ref 0.0–0.4)
Eos: 2 %
Hematocrit: 44.3 % (ref 34.0–46.6)
Hemoglobin: 14.6 g/dL (ref 11.1–15.9)
Immature Grans (Abs): 0 10*3/uL (ref 0.0–0.1)
Immature Granulocytes: 0 %
Lymphocytes Absolute: 1.6 10*3/uL (ref 0.7–3.1)
Lymphs: 22 %
MCH: 28.7 pg (ref 26.6–33.0)
MCHC: 33 g/dL (ref 31.5–35.7)
MCV: 87 fL (ref 79–97)
Monocytes Absolute: 0.8 10*3/uL (ref 0.1–0.9)
Monocytes: 11 %
Neutrophils Absolute: 4.6 10*3/uL (ref 1.4–7.0)
Neutrophils: 65 %
RBC: 5.09 x10E6/uL (ref 3.77–5.28)
RDW: 13.4 % (ref 11.7–15.4)
WBC: 7.2 10*3/uL (ref 3.4–10.8)

## 2019-10-18 LAB — AMYLASE: Amylase: 41 U/L (ref 31–110)

## 2019-10-18 LAB — LIPASE: Lipase: 21 U/L (ref 14–85)

## 2019-10-18 NOTE — Telephone Encounter (Signed)
Message completed via another message.

## 2019-10-18 NOTE — Telephone Encounter (Signed)
Spoke with patient to clarify about proceeding with CT scan. She reports her acute abdominal pain, nausea, and vomiting have resolved. She is COVID negative. She does reports some weakness but she is actually at work today. She denies fever. She reports her abdominal pain is back to baseline and has been present for months, including when she had a CT scan in 08/2019. Her labwork was reassuring without a white count, elevated LFTs, and normal pancreatic enzymes. Counseled patient I do not think a STAT CT would benefit her at this point as I would be getting this mainly to evaluate for acute processes which no longer seem to be an issue. Advised her to follow up with her PCP for her chronic abdominal pain. Patient is in agreement.

## 2019-10-18 NOTE — Telephone Encounter (Signed)
Her labwork is normal. No white count, liver and pancreatic enzymes are normal. If she is still having abdominal pain, I can get a CT scan to evaluate for other processes, however it may not be done today.

## 2019-10-18 NOTE — Telephone Encounter (Signed)
Patient was advised on lab results. She says she would like to have the ct scan done.

## 2019-10-23 DIAGNOSIS — R69 Illness, unspecified: Secondary | ICD-10-CM | POA: Diagnosis not present

## 2019-10-30 NOTE — Progress Notes (Signed)
Rebecca Patton  MRN: [id number] DOB: August 07, 1946  Subjective:  HPI   The patient is a 74 year old female who presents via phone visit for symptoms of diarrhea and cough.   Obtaining information from the patient was extremely difficult as she was somewhat distracted.  She was at the hospital getting her husband discharged and then taking him to rehab.   While on the phone she was heard to have a cough that sounded like it was from congestion in her chest.  However, she states it is just a cough from a tickle or drainage.  When asked about sinus drainage and head congestion she said she has had that for 20 years but doesn't have any right now.   Her diarrhea is described as having soft bowel movements about 6 times per day.  Not watery and no blood seen. Patient did have negative Covid test on Feb 2.   Of note is her husband was diagnosed with small cell lung cancer that is untreatable on top of his Alzheimer's disease.  This is a major issue for her as he is now in rehab to hopefully get his strength back but she cannot visit him and her daughters are putting pressure on her to take him home. Her cough seems to be related to chronic postnasal drainage which she is complained about for many years.  No fever chills hemoptysis bothers her mainly at night with the drainage.  He has not tried an antihistamine at some time.  Not tried any other medications. Her bowel issues are she had diarrhea followed by constipation.  She is having frequent loose stools 4-6 times per day.  There is some urgency with it.  She describes feeling a little bit constipated still.  Patient Active Problem List   Diagnosis Date Noted  . Incisional hernia, without obstruction or gangrene   . Supraumbilical hernia [id number]  . Anxiety 03/20/2015  . Arthritis 03/20/2015  . 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 Clinical depression 03/20/2015  . Malignant neoplasm of corpus uteri, except isthmus ([place]) 03/20/2015  .  Essential (primary) hypertension 03/20/2015  . HLD (hyperlipidemia) 03/20/2015  . Cannot sleep 03/20/2015  . Osteopenia 03/20/2015  . Adult hypothyroidism 03/20/2015    Past Medical History:  Diagnosis Date  . Arthritis   . Depression   . Dysrhythmia    ST  . High blood pressure   . Hypothyroidism   . MRSA infection 2006   on nose  . Uterine cancer ([name]) 2009    Social History   Socioeconomic History  . Marital status: Married    Spouse name: Not on file  . Number of children: 2  . Years of education: Not on file  . Highest education level: Some college, no degree  Occupational History  . Occupation: receptionist part time  Tobacco Use  . Smoking status: Former Smoker    Quit date: 09/11/1997    Years since quitting: 22.1  . Smokeless tobacco: Never Used  Substance and Sexual Activity  . Alcohol use: No    Alcohol/week: 0.0 standard drinks  . Drug use: No  . Sexual activity: Not Currently  Other Topics Concern  . Not on file  Social History Narrative   Husband is physically and mentally disable following a car wreck in 2006.   Social Determinants of Health   Financial Resource Strain: Low Risk   . Difficulty of Paying Living Expenses: Not hard at all  Food Insecurity:   .  Worried About Charity fundraiser in the Last Year: Not on file  . Ran Out of Food in the Last Year: Not on file  Transportation Needs:   . Lack of Transportation (Medical): Not on file  . Lack of Transportation (Non-Medical): Not on file  Physical Activity: Inactive  . Days of Exercise per Week: 0 days  . Minutes of Exercise per Session: 0 min  Stress: Stress Concern Present  . Feeling of Stress : Rather much  Social Connections: Unknown  . Frequency of Communication with Friends and Family: Patient refused  . Frequency of Social Gatherings with Friends and Family: Patient refused  . Attends Religious Services: Patient refused  . Active Member of Clubs or Organizations: Patient  refused  . Attends Archivist Meetings: Patient refused  . Marital Status: Patient refused  Intimate Partner Violence: Unknown  . Fear of Current or Ex-Partner: Patient refused  . Emotionally Abused: Patient refused  . Physically Abused: Patient refused  . Sexually Abused: Patient refused    Outpatient Encounter Medications as of 10/31/2019  Medication Sig Note  . aspirin 81 MG tablet Take 81 mg by mouth.  04/21/2015: Every 3 days.   . hydrochlorothiazide (HYDRODIURIL) 25 MG tablet TAKE 1 TABLET DAILY   . levothyroxine (SYNTHROID, LEVOTHROID) 50 MCG tablet TAKE 1 TABLET DAILY BEFORE BREAKFAST   . metoprolol succinate (TOPROL-XL) 25 MG 24 hr tablet TAKE 1 TABLET DAILY . TAKE WITH OR IMMEDIATELY        FOLLOWING A MEAL   . MULTIPLE VITAMIN PO Take by mouth. 03/20/2015: Received from: [facility]  . OMEGA-3 FATTY ACIDS PO Take 1,040 mg by mouth daily.  03/20/2015: Received from: [facility]  . omeprazole (PRILOSEC) 20 MG capsule TAKE 1 CAPSULE DAILY   . ondansetron (ZOFRAN) 4 MG tablet Take 1 tablet (4 mg total) by mouth every 8 (eight) hours as needed for nausea or vomiting.   . quinapril (ACCUPRIL) 40 MG tablet TAKE 1 TABLET DAILY   . sertraline (ZOLOFT) 50 MG tablet Take 1 tablet (50 mg total) by mouth daily.   . vitamin B-12 (CYANOCOBALAMIN) 100 MCG tablet Take 200 mcg by mouth daily.    No facility-administered encounter medications on file as of 10/31/2019.    No Known Allergies  Review of Systems  Constitutional: Positive for malaise/fatigue. Negative for chills, diaphoresis and fever.  HENT: Negative for congestion, ear pain, sinus pain and sore throat.   Respiratory: Positive for cough and sputum production. Negative for shortness of breath and wheezing.   Cardiovascular: Negative for chest pain and palpitations.  Gastrointestinal: Positive for abdominal pain and diarrhea. Negative for blood in stool, nausea and vomiting.    Objective:    There were no vitals taken for this visit.  Physical Exam  Assessment and Plan :  No diagnosis found. 1. Cough I think this is a simple cough from postnasal drainage due to her chronic sinus issues.  Will obtain chest x-ray or imaging if this persists.  At this time I would recommend trying Claritin daily and if after a week it is not improving try Robitussin DM twice a day. There is no sign of bacterial infection and due to the bowel issue would avoid antibiotics at this time as I explained the patient.  She did have a negative Covid test 2 weeks ago.  This was done due to the cough.  Nothing is worsened in that time.  If anything it is actually improved. 2.  Diarrhea, unspecified type She originally had diarrhea followed by constipation and now some loose stools.  As noted above, will avoid antibiotics.  She had full work-up in labs in the last couple of months.  At this time at first we will try probiotic twice a day for a week or 2.  If that does not help consider GlycoLax daily.  May need referral to specialist if this persists.  3. Essential (primary) hypertension She is on an ACE inhibitor.  Consider stopping the quinapril due to the cough but her cough is very infrequent according to the patient.  The problem is she will have spasms of cough that interrupts phone conversations at work, etc.  4. Anxiety   5. Recurrent major depressive disorder, in remission ([place]) Adjustment reaction with her husband and failing health and due to Covid restrictions she cannot even visit him in rehab center.  We spent a good bit of time talking about this.  She is clinically stable.   I have done the exam and reviewed the chart and it is accurate to the best of my knowledge. Development worker, community has been used and  any errors in dictation or transcription are unintentional. Miguel Aschoff M.D. [facility]

## 2019-10-31 ENCOUNTER — Ambulatory Visit (INDEPENDENT_AMBULATORY_CARE_PROVIDER_SITE_OTHER): Payer: Medicare HMO | Admitting: Family Medicine

## 2019-10-31 ENCOUNTER — Other Ambulatory Visit: Payer: Self-pay

## 2019-10-31 DIAGNOSIS — R197 Diarrhea, unspecified: Secondary | ICD-10-CM

## 2019-10-31 DIAGNOSIS — R05 Cough: Secondary | ICD-10-CM

## 2019-10-31 DIAGNOSIS — R69 Illness, unspecified: Secondary | ICD-10-CM | POA: Diagnosis not present

## 2019-10-31 DIAGNOSIS — C549 Malignant neoplasm of corpus uteri, unspecified: Secondary | ICD-10-CM | POA: Diagnosis not present

## 2019-10-31 DIAGNOSIS — R059 Cough, unspecified: Secondary | ICD-10-CM

## 2019-10-31 DIAGNOSIS — F3340 Major depressive disorder, recurrent, in remission, unspecified: Secondary | ICD-10-CM

## 2019-10-31 DIAGNOSIS — I10 Essential (primary) hypertension: Secondary | ICD-10-CM

## 2019-10-31 DIAGNOSIS — F419 Anxiety disorder, unspecified: Secondary | ICD-10-CM

## 2019-11-18 ENCOUNTER — Telehealth: Payer: Self-pay

## 2019-11-18 NOTE — Telephone Encounter (Signed)
Copied from [place] [id number]. Topic: General - Other >> Nov 18, 2019 11:39 AM Rainey Pines A wrote: Patient stated that none of the medications worked for her diarrhea and she wants to know what Dr. Rosanna Randy would further advise .Please advise

## 2019-11-18 NOTE — Telephone Encounter (Signed)
Please advise 

## 2019-11-19 NOTE — Telephone Encounter (Signed)
Scheduled pt an appt for 11/21/19 at 11:00 am.

## 2019-11-19 NOTE — Progress Notes (Signed)
Patient: Rebecca Patton Female    DOB: [birth year]   74 y.o.   MRN: [id number] Visit Date: 11/21/2019  Today's Provider: [name], MD   Chief Complaint  Patient presents with  . 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  Her husband has significant dementia.  She is the caregiver.  GI symptoms as documented below. Diarrhea, unspecified type From 10/31/2019-She originally had diarrhea followed by constipation and now some loose stools.  She had full work-up in labs in the last couple of months. Patient was advised to try probiotic twice a day for a week or 2.  If that does not help consider GlycoLax daily.  May need referral to specialist if this persists. Today patient comes in reporting that none of the treatments advised are helping with diarrhea. She describes her stool as a runny consistency. The stool color varies from light yellow to a deep red. Most of the time her stools are a light brown. Patient denies and mucus or pus in her stools.   Patient states she pain all over her body along with the diarrhea.   No Known Allergies   Current Outpatient Medications:  .  aspirin 81 MG tablet, Take 81 mg by mouth. , Disp: , Rfl:  .  hydrochlorothiazide (HYDRODIURIL) 25 MG tablet, TAKE 1 TABLET DAILY, Disp: 90 tablet, Rfl: 3 .  levothyroxine (SYNTHROID, LEVOTHROID) 50 MCG tablet, TAKE 1 TABLET DAILY BEFORE BREAKFAST, Disp: 90 tablet, Rfl: 3 .  metoprolol succinate (TOPROL-XL) 25 MG 24 hr tablet, TAKE 1 TABLET DAILY . TAKE WITH OR IMMEDIATELY        FOLLOWING A MEAL, Disp: 90 tablet, Rfl: 3 .  MULTIPLE VITAMIN PO, Take by mouth., Disp: , Rfl:  .  OMEGA-3 FATTY ACIDS PO, Take 1,040 mg by mouth daily. , Disp: , Rfl:  .  omeprazole (PRILOSEC) 20 MG capsule, TAKE 1 CAPSULE DAILY, Disp: 90 capsule, Rfl: 1 .  quinapril (ACCUPRIL) 40 MG tablet, TAKE 1 TABLET DAILY, Disp: 90 tablet, Rfl: 3 .  sertraline (ZOLOFT) 50 MG tablet, Take 1 tablet  (50 mg total) by mouth daily., Disp: 90 tablet, Rfl: 3 .  vitamin B-12 (CYANOCOBALAMIN) 100 MCG tablet, Take 200 mcg by mouth daily., Disp: , Rfl:  .  ondansetron (ZOFRAN) 4 MG tablet, Take 1 tablet (4 mg total) by mouth every 8 (eight) hours as needed for nausea or vomiting. (Patient not taking: Reported on 11/21/2019), Disp: 20 tablet, Rfl: 0  Review of Systems  Constitutional: Negative for appetite change, chills, fatigue and fever.  Eyes: Negative.   Respiratory: Negative for chest tightness and shortness of breath.   Cardiovascular: Negative for chest pain and palpitations.  Gastrointestinal: Positive for abdominal pain and diarrhea. Negative for nausea and vomiting.  Endocrine: Negative.   Musculoskeletal: Positive for arthralgias and back pain.  Allergic/Immunologic: Negative.   Neurological: Negative for dizziness and weakness.  Hematological: Negative.   Psychiatric/Behavioral: Negative.     Social History   Tobacco Use  . Smoking status: Former Smoker    Quit date: 09/11/1997    Years since quitting: 22.2  . Smokeless tobacco: Never Used  Substance Use Topics  . Alcohol use: No    Alcohol/week: 0.0 standard drinks      Objective:   BP 132/70 (BP Location: Right Arm, Patient Position: Sitting, Cuff Size: Large)   Pulse 64   Temp (!) 97.3  F (36.3 C) (Temporal)   Resp 16   Wt 206 lb (93.4 kg)   SpO2 97%   BMI 33.25 kg/m  Vitals:   11/21/19 1112  BP: 132/70  Pulse: 64  Resp: 16  Temp: (!) 97.3 F (36.3 C)  TempSrc: Temporal  SpO2: 97%  Weight: 206 lb (93.4 kg)  Body mass index is 33.25 kg/m.   Physical Exam Vitals reviewed.  HENT:     Head: Normocephalic and atraumatic.     Right Ear: External ear normal.     Left Ear: External ear normal.  Eyes:     General: No scleral icterus.    Conjunctiva/sclera: Conjunctivae normal.  Cardiovascular:     Rate and Rhythm: Normal rate and regular rhythm.     Heart sounds: Normal heart sounds.  Pulmonary:      Effort: Pulmonary effort is normal.     Breath sounds: Normal breath sounds.  Abdominal:     General: There is no distension.     Palpations: Abdomen is soft. There is no mass.     Tenderness: There is abdominal tenderness. There is no guarding or rebound.     Hernia: No hernia is present.     Comments: Minimal epigastric and left lower quadrant tenderness without guarding or rebound.  Lymphadenopathy:     Cervical: No cervical adenopathy.  Neurological:     Mental Status: She is alert.      No results found for any visits on 11/21/19.     Assessment & Plan    1. Gastroesophageal reflux disease Treat as gastritis with twice daily omeprazole.  - omeprazole (PRILOSEC) 20 MG capsule; Take 1 capsule (20 mg total) by mouth 2 (two) times daily before a meal.  Dispense: 180 capsule; Refill: 1  2. Gastritis, presence of bleeding unspecified, unspecified chronicity, unspecified gastritis type  - omeprazole (PRILOSEC) 20 MG capsule; Take 1 capsule (20 mg total) by mouth 2 (two) times daily before a meal.  Dispense: 180 capsule; Refill: 1  3. Diverticulitisve I am not sure this is diverticulitis but I think that she has a history of dirticulosis and she has mild left lower quadrant tenderness.  Treat with doxycycline and refer back to GI for consideration of work-up. Refer to [facility].   - doxycycline (VIBRA-TABS) 100 MG tablet; Take 1 tablet (100 mg total) by mouth 2 (two) times daily.  Dispense: 20 tablet; Refill: 0 - Ambulatory referral to Gastroenterology  4. Recurrent major depressive disorder, in remission ([place]) In partial remission.  At this time increase sertraline to 100 mg daily.  Return to clinic 1 month. - sertraline (ZOLOFT) 50 MG tablet; Take 1 tablet (50 mg total) by mouth daily.  Dispense: 90 tablet; Refill: 3   Follow up in one month.     Richard Cranford Mon, MD  [facility]

## 2019-11-19 NOTE — Telephone Encounter (Signed)
OV at some point so I can examine her.

## 2019-11-20 DIAGNOSIS — D485 Neoplasm of uncertain behavior of skin: Secondary | ICD-10-CM | POA: Diagnosis not present

## 2019-11-20 DIAGNOSIS — B079 Viral wart, unspecified: Secondary | ICD-10-CM | POA: Diagnosis not present

## 2019-11-20 DIAGNOSIS — C44722 Squamous cell carcinoma of skin of right lower limb, including hip: Secondary | ICD-10-CM | POA: Diagnosis not present

## 2019-11-21 ENCOUNTER — Encounter: Payer: Self-pay | Admitting: Family Medicine

## 2019-11-21 ENCOUNTER — Other Ambulatory Visit: Payer: Self-pay

## 2019-11-21 ENCOUNTER — Ambulatory Visit (INDEPENDENT_AMBULATORY_CARE_PROVIDER_SITE_OTHER): Payer: Medicare HMO | Admitting: Family Medicine

## 2019-11-21 ENCOUNTER — Telehealth: Payer: Self-pay

## 2019-11-21 VITALS — BP 132/70 | HR 64 | Temp 97.3°F | Resp 16 | Wt 206.0 lb

## 2019-11-21 DIAGNOSIS — K5792 Diverticulitis of intestine, part unspecified, without perforation or abscess without bleeding: Secondary | ICD-10-CM

## 2019-11-21 DIAGNOSIS — K2970 Gastritis, unspecified, without bleeding: Secondary | ICD-10-CM | POA: Diagnosis not present

## 2019-11-21 DIAGNOSIS — K219 Gastro-esophageal reflux disease without esophagitis: Secondary | ICD-10-CM

## 2019-11-21 DIAGNOSIS — F3340 Major depressive disorder, recurrent, in remission, unspecified: Secondary | ICD-10-CM | POA: Diagnosis not present

## 2019-11-21 DIAGNOSIS — R69 Illness, unspecified: Secondary | ICD-10-CM | POA: Diagnosis not present

## 2019-11-21 MED ORDER — DOXYCYCLINE HYCLATE 100 MG PO TABS: 100.0000 mg | ORAL_TABLET | Freq: Two times a day (BID) | ORAL | 0 refills | Status: DC

## 2019-11-21 MED ORDER — SERTRALINE HCL 50 MG PO TABS: 50.0000 mg | ORAL_TABLET | Freq: Every day | ORAL | 3 refills | Status: DC

## 2019-11-21 MED ORDER — SERTRALINE HCL 100 MG PO TABS: 100.0000 mg | ORAL_TABLET | Freq: Every day | ORAL | 3 refills | Status: DC

## 2019-11-21 MED ORDER — OMEPRAZOLE 20 MG PO CPDR: 20.0000 mg | DELAYED_RELEASE_CAPSULE | Freq: Two times a day (BID) | ORAL | 1 refills | Status: DC

## 2019-11-21 NOTE — Telephone Encounter (Signed)
Patient advised.

## 2019-11-21 NOTE — Telephone Encounter (Signed)
Copied from [place] [id number]. Topic: General - Other >> Nov 21, 2019  3:25 PM Greggory Keen D wrote: Reason for CRM:  Pt called saying she was in this morning and seen Dr. Rosanna Randy.  She said he ordered her an antibiotic and she doesn't really understand why.  She came in for stomach issues.   Please advise  [phone]

## 2019-11-25 ENCOUNTER — Other Ambulatory Visit: Payer: Self-pay | Admitting: Family Medicine

## 2019-11-25 DIAGNOSIS — K219 Gastro-esophageal reflux disease without esophagitis: Secondary | ICD-10-CM

## 2019-11-25 DIAGNOSIS — K2970 Gastritis, unspecified, without bleeding: Secondary | ICD-10-CM

## 2019-11-25 MED ORDER — OMEPRAZOLE 20 MG PO CPDR: 20.0000 mg | DELAYED_RELEASE_CAPSULE | Freq: Two times a day (BID) | ORAL | 1 refills | Status: DC

## 2019-11-25 NOTE — Telephone Encounter (Signed)
[facility] ref[facility] request for the following medications:  omeprazole (PRILOSEC) 20 MG capsule  Please advise.  Thanks, [employer]

## 2019-11-27 ENCOUNTER — Telehealth: Payer: Self-pay | Admitting: Family Medicine

## 2019-11-27 NOTE — Telephone Encounter (Signed)
[facility] and stated that medication omeprazole (PRILOSEC) 20 MG capsule [id number] Will need a PA. Please advise.

## 2019-11-27 NOTE — Telephone Encounter (Signed)
Phone number for PA is [phone].

## 2019-11-30 ENCOUNTER — Other Ambulatory Visit: Payer: Self-pay | Admitting: Family Medicine

## 2019-11-30 DIAGNOSIS — I10 Essential (primary) hypertension: Secondary | ICD-10-CM

## 2019-12-06 DIAGNOSIS — K219 Gastro-esophageal reflux disease without esophagitis: Secondary | ICD-10-CM | POA: Diagnosis not present

## 2019-12-06 DIAGNOSIS — K529 Noninfective gastroenteritis and colitis, unspecified: Secondary | ICD-10-CM | POA: Diagnosis not present

## 2019-12-07 ENCOUNTER — Other Ambulatory Visit
Admission: RE | Admit: 2019-12-07 | Discharge: 2019-12-07 | Disposition: A | Discharge status: Home / Self Care | Payer: Medicare HMO | Source: Ambulatory Visit | Attending: Student | Admitting: Student

## 2019-12-07 DIAGNOSIS — K529 Noninfective gastroenteritis and colitis, unspecified: Secondary | ICD-10-CM | POA: Insufficient documentation

## 2019-12-07 LAB — C DIFFICILE QUICK SCREEN W PCR REFLEX
C Diff antigen: NEGATIVE
C Diff interpretation: NOT DETECTED
C Diff toxin: NEGATIVE

## 2019-12-08 ENCOUNTER — Other Ambulatory Visit: Payer: Self-pay | Admitting: Family Medicine

## 2019-12-08 DIAGNOSIS — I10 Essential (primary) hypertension: Secondary | ICD-10-CM

## 2019-12-09 ENCOUNTER — Other Ambulatory Visit: Payer: Self-pay | Admitting: Family Medicine

## 2019-12-09 DIAGNOSIS — K2970 Gastritis, unspecified, without bleeding: Secondary | ICD-10-CM

## 2019-12-09 DIAGNOSIS — K219 Gastro-esophageal reflux disease without esophagitis: Secondary | ICD-10-CM

## 2019-12-09 NOTE — Telephone Encounter (Signed)
Requested Prescriptions  Pending Prescriptions Disp Refills  . quinapril (ACCUPRIL) 40 MG tablet [Pharmacy Med Name: QUINAPRIL TAB 40MG ] 90 tablet 3    Sig: TAKE 1 TABLET DAILY.     Cardiovascular:  ACE Inhibitors Passed - 12/08/2019  8:06 PM      Passed - Cr in normal range and within 180 days    Creatinine  Date Value Ref Range Status  03/25/2012 0.89 0.60 - 1.30 mg/dL Final   Creatinine, Ser  Date Value Ref Range Status  10/17/2019 0.92 0.57 - 1.00 mg/dL Final         Passed - K in normal range and within 180 days    Potassium  Date Value Ref Range Status  10/17/2019 3.9 3.5 - 5.2 mmol/L Final  03/25/2012 3.6 3.5 - 5.1 mmol/L Final         Passed - Patient is not pregnant      Passed - Last BP in normal range    BP Readings from Last 1 Encounters:  11/21/19 132/70         Passed - Valid encounter within last 6 months    Recent Outpatient Visits          2 weeks ago Gastritis, presence of bleeding unspecified, unspecified chronicity, unspecified gastritis type   [facility] Jerrol Banana., MD   1 month ago Cough   [facility] Jerrol Banana., MD   1 month ago Epigastric pain   [place], [place], [place]   6 months ago Rhus dermatitis   [facility] Jerrol Banana., MD   8 months ago Adult hypothyroidism   [facility] Jerrol Banana., MD      Future Appointments            In 2 weeks Jerrol Banana., MD [facility]           . hydrochlorothiazide (HYDRODIURIL) 25 MG tablet [Pharmacy Med Name: HYDROCHLOROT TAB 25MG ] 90 tablet 3    Sig: TAKE 1 TABLET DAILY.     Cardiovascular: Diuretics - Thiazide Passed - 12/08/2019  8:06 PM      Passed - Ca in normal range and within 360 days    Calcium  Date Value Ref Range Status  10/17/2019 9.3 8.7 - 10.3 mg/dL Final   Calcium, Total  Date Value Ref Range Status  03/25/2012 8.8  8.5 - 10.1 mg/dL Final         Passed - Cr in normal range and within 360 days    Creatinine  Date Value Ref Range Status  03/25/2012 0.89 0.60 - 1.30 mg/dL Final   Creatinine, Ser  Date Value Ref Range Status  10/17/2019 0.92 0.57 - 1.00 mg/dL Final         Passed - K in normal range and within 360 days    Potassium  Date Value Ref Range Status  10/17/2019 3.9 3.5 - 5.2 mmol/L Final  03/25/2012 3.6 3.5 - 5.1 mmol/L Final         Passed - Na in normal range and within 360 days    Sodium  Date Value Ref Range Status  10/17/2019 141 134 - 144 mmol/L Final  03/25/2012 143 136 - 145 mmol/L Final         Passed - Last BP in normal range    BP Readings from Last 1 Encounters:  11/21/19 132/70  Passed - Valid encounter within last 6 months    Recent Outpatient Visits          2 weeks ago Gastritis, presence of bleeding unspecified, unspecified chronicity, unspecified gastritis type   [facility] Jerrol Banana., MD   1 month ago Cough   [facility] Jerrol Banana., MD   1 month ago Epigastric pain   [place], [place], [place]   6 months ago Rhus dermatitis   [facility] Jerrol Banana., MD   8 months ago Adult hypothyroidism   [facility] Jerrol Banana., MD      Future Appointments            In 2 weeks Jerrol Banana., MD [facility]           . levothyroxine (SYNTHROID) 50 MCG tablet [Pharmacy Med Name: LEVOTHYROXIN TAB 0.05MG ] 90 tablet 3    Sig: TAKE 1 TABLET DAILY BEFORE BREAKFAST.     Endocrinology:  Hypothyroid Agents Failed - 12/08/2019  8:06 PM      Failed - TSH needs to be rechecked within 3 months after an abnormal result. Refill until TSH is due.      Passed - TSH in normal range and within 360 days    TSH  Date Value Ref Range Status  03/28/2019 3.880 0.450 - 4.500 uIU/mL Final         Passed - Valid  encounter within last 12 months    Recent Outpatient Visits          2 weeks ago Gastritis, presence of bleeding unspecified, unspecified chronicity, unspecified gastritis type   [facility] Jerrol Banana., MD   1 month ago Cough   [facility] Jerrol Banana., MD   1 month ago Epigastric pain   [place], [place], [place]   6 months ago Rhus dermatitis   [facility] Jerrol Banana., MD   8 months ago Adult hypothyroidism   [facility] Jerrol Banana., MD      Future Appointments            In 2 weeks Jerrol Banana., MD [facility], [place]

## 2019-12-11 LAB — PANCREATIC ELASTASE, FECAL: Pancreatic Elastase-1, Stool: 321 ug Elast./g (ref >200)

## 2019-12-11 LAB — OVA + PARASITE EXAM

## 2019-12-11 LAB — CALPROTECTIN, FECAL: Calprotectin, Fecal: 107 ug/g (ref 0–120)

## 2019-12-11 LAB — O&P RESULT

## 2019-12-12 LAB — STOOL CULTURE: E coli, Shiga toxin Assay: NEGATIVE

## 2019-12-12 LAB — STOOL CULTURE REFLEX - CMPCXR

## 2019-12-12 LAB — STOOL CULTURE REFLEX - RSASHR

## 2019-12-19 ENCOUNTER — Other Ambulatory Visit: Payer: Self-pay | Admitting: Family Medicine

## 2019-12-19 ENCOUNTER — Ambulatory Visit: Payer: Self-pay | Admitting: *Deleted

## 2019-12-19 ENCOUNTER — Ambulatory Visit (INDEPENDENT_AMBULATORY_CARE_PROVIDER_SITE_OTHER): Payer: Medicare HMO | Admitting: Physician Assistant

## 2019-12-19 DIAGNOSIS — R0981 Nasal congestion: Secondary | ICD-10-CM | POA: Diagnosis not present

## 2019-12-19 DIAGNOSIS — R05 Cough: Secondary | ICD-10-CM | POA: Diagnosis not present

## 2019-12-19 DIAGNOSIS — K219 Gastro-esophageal reflux disease without esophagitis: Secondary | ICD-10-CM

## 2019-12-19 DIAGNOSIS — K2970 Gastritis, unspecified, without bleeding: Secondary | ICD-10-CM

## 2019-12-19 DIAGNOSIS — R059 Cough, unspecified: Secondary | ICD-10-CM

## 2019-12-19 MED ORDER — LEVOCETIRIZINE DIHYDROCHLORIDE 5 MG PO TABS: 5.0000 mg | ORAL_TABLET | Freq: Every evening | ORAL | 1 refills | Status: DC

## 2019-12-19 MED ORDER — HYDROCODONE-HOMATROPINE 5-1.5 MG/5ML PO SYRP: 5.0000 mL | ORAL_SOLUTION | Freq: Three times a day (TID) | ORAL | 0 refills | Status: DC | PRN

## 2019-12-19 NOTE — Telephone Encounter (Signed)
Patient requesting omeprazole (PRILOSEC) 20 MG capsule request, informed please allow 48 to 72 hour turn around time.   [facility] [place] [place] [facility] [place] Sites Phone:  [phone]  Fax:  [phone]

## 2019-12-19 NOTE — Telephone Encounter (Signed)
I returned her call.   She is c/o "sinus drainage and congestion".   "I've been dealing with sinus issues for 20 years".  "I've had 3 sinus surgeries". Lately my sinus symptoms have gotten worse.   Her husband passed away very recently and she has been crying a lot.   The high pollen count is probably contributing too as it does this time of year.   She has a non productive cough but is coughing a lot.   "I coughed all last night".  She was coughing a lot while talking with her.   She is using a OTC medication for cough and congestion that her pharmacist recommended for 3 days and it has not helped.  She is also requesting something to help her sleep.  "I've been under so much stress with caring for my husband, him passing away and everything lately".   I started going over the care advice when she told me,   "I've been dealing with sinus issues for 20 years".   "I've done those things and know what does and does not work for me".    I did not go over a lot of the care advice after she told me that.    I made her virtual visit appt for today with Carles Collet, PA at 2:40.    She does not have a computer so she does phone calls with her doctors.  I did the COVID-19 screening questionnaire due to symptoms unable to come in.   [phone].  (Dr. Rosanna Randy is booked).  I sent my notes to the office.   Reason for Disposition . [1] Sinus congestion (pressure, fullness) AND [2] present > 10 days  Answer Assessment - Initial Assessment Questions 1. ONSET: "When did the nasal discharge start?"      Sinus drainage and coughing a lot.   I've had bad sinuses for 20 years.   I had 3 sinus surgeries.    My husband passed a way and I've been crying a lot plus the pollen. 2. AMOUNT: "How much discharge is there?"      My daughter is a paramedic and listened to my lungs.   She said I was clear.  3. COUGH: "Do you have a cough?" If yes, ask: "Describe the color of your sputum" (clear, white, yellow, green)  Cough and Congestion DM  I've been taking this for 2 days.    It's a dry cough most of the time.    It's draining down my throat. 4. RESPIRATORY DISTRESS: "Describe your breathing."      I don't feel short of breath.    I'm very tired from all I've been through with my husband passing away.   5. FEVER: "Do you have a fever?" If so, ask: "What is your temperature, how was it measured, and when did it start?"     No fever.   I've checked it.    I've had both my COVID-19 vaccines. 6. SEVERITY: "Overall, how bad are you feeling right now?" (e.g., doesn't interfere with normal activities, staying home from school/work, staying in bed)      Feeling very tired.   I'm coughing most of the night.  7. OTHER SYMPTOMS: "Do you have any other symptoms?" (e.g., sore throat, earache, wheezing, vomiting)     My throat is a little scratchy but no pus in it.   I'm coughing a lot.    I also need something to help me sleep.  8. PREGNANCY: "Is there any chance you are pregnant?" "When was your last menstrual period?"     N/A due to age.  Protocols used: COMMON COLD-A-AH

## 2019-12-19 NOTE — Progress Notes (Signed)
Virtual Visit via Telephone Note  I connected with [name] on 12/19/19 at  2:40 PM EDT by telephone and verified that I am speaking with the correct person using two identifiers.  Location: Patient: Home Provider: Office    I discussed the limitations, risks, security and privacy concerns of performing an evaluation and management service by telephone and the availability of in person appointments. I also discussed with the patient that there may be a patient responsible charge related to this service. The patient expressed understanding and agreed to proceed.    Patient: Rebecca Patton Female    DOB: 21-Nov-1945   74 y.o.   MRN: [id number] Visit Date: 12/19/2019  Today's Provider: Trinna Post, PA-C   Chief Complaint  Patient presents with  . Cough  . Nasal Congestion  . Depression   Subjective:    Cough This is a recurrent problem. The current episode started 1 to 4 weeks ago. The problem has been gradually worsening. The problem occurs every few minutes. The cough is non-productive. Associated symptoms include headaches, nasal congestion, postnasal drip and rhinorrhea. Pertinent negatives include no chills, ear pain, fever or shortness of breath. The symptoms are aggravated by lying down. She has tried OTC cough suppressant for the symptoms. The treatment provided mild relief.  Depression      The patient presents with depression.  This is a recurrent problem.  The current episode started more than 1 month ago.   The onset quality is gradual.   The problem occurs constantly.  Associated symptoms include headaches.     The symptoms are aggravated by family issues.  Past medical history includes depression.    She has been coughing for several weeks and got recommendation for cough syrup from the pharmacy which she has taken for two days. This has not helped. Seen by PCP in 10/2019 for cough and this was thought to be due to PND. She was instructed to start Claritin. Did not  have asthma. Smoked many years ago.   Her husband has just recently passed away.   She is also having chronic diarrhea that she is followed by [facility] for. She is following up with them.  No Known Allergies   Current Outpatient Medications:  .  aspirin 81 MG tablet, Take 81 mg by mouth. , Disp: , Rfl:  .  doxycycline (VIBRA-TABS) 100 MG tablet, Take 1 tablet (100 mg total) by mouth 2 (two) times daily., Disp: 20 tablet, Rfl: 0 .  hydrochlorothiazide (HYDRODIURIL) 25 MG tablet, TAKE 1 TABLET DAILY., Disp: 90 tablet, Rfl: 3 .  levothyroxine (SYNTHROID) 50 MCG tablet, TAKE 1 TABLET DAILY BEFORE BREAKFAST., Disp: 90 tablet, Rfl: 3 .  metoprolol succinate (TOPROL-XL) 25 MG 24 hr tablet, TAKE 1 TABLET DAILY . TAKE WITH OR IMMEDIATELY        FOLLOWING A MEAL., Disp: 90 tablet, Rfl: 3 .  MULTIPLE VITAMIN PO, Take by mouth., Disp: , Rfl:  .  OMEGA-3 FATTY ACIDS PO, Take 1,040 mg by mouth daily. , Disp: , Rfl:  .  omeprazole (PRILOSEC) 20 MG capsule, Take 1 capsule (20 mg total) by mouth 2 (two) times daily before a meal., Disp: 180 capsule, Rfl: 1 .  ondansetron (ZOFRAN) 4 MG tablet, Take 1 tablet (4 mg total) by mouth every 8 (eight) hours as needed for nausea or vomiting. (Patient not taking: Reported on 11/21/2019), Disp: 20 tablet, Rfl: 0 .  quinapril (ACCUPRIL) 40 MG tablet, TAKE 1 TABLET  DAILY., Disp: 90 tablet, Rfl: 3 .  sertraline (ZOLOFT) 100 MG tablet, Take 1 tablet (100 mg total) by mouth daily., Disp: 90 tablet, Rfl: 3 .  vitamin B-12 (CYANOCOBALAMIN) 100 MCG tablet, Take 200 mcg by mouth daily., Disp: , Rfl:   Review of Systems  Constitutional: Negative.  Negative for chills and fever.  HENT: Positive for postnasal drip and rhinorrhea. Negative for ear pain.   Eyes: Negative.   Respiratory: Positive for cough. Negative for shortness of breath.   Cardiovascular: Negative.   Gastrointestinal: Negative.   Endocrine: Negative.   Genitourinary: Negative.   Musculoskeletal:  Negative.   Skin: Negative.   Allergic/Immunologic: Negative.   Neurological: Positive for headaches.  Hematological: Negative.   Psychiatric/Behavioral: Positive for depression and sleep disturbance.    Social History   Tobacco Use  . Smoking status: Former Smoker    Quit date: 09/11/1997    Years since quitting: 22.2  . Smokeless tobacco: Never Used  Substance Use Topics  . Alcohol use: No    Alcohol/week: 0.0 standard drinks      Objective:   There were no vitals taken for this visit. There were no vitals filed for this visit.There is no height or weight on file to calculate BMI.   Physical Exam   No results found for any visits on 12/19/19.     Assessment & Plan    1. Nasal congestion  Will change claritin to Xyzal.   - levocetirizine (XYZAL) 5 MG tablet; Take 1 tablet (5 mg total) by mouth every evening.  Dispense: 90 tablet; Refill: 1  2. Cough  Explained varying causes of cough including PND, heart burn, bronchitis/reactive airway disease, side effects from quinapril. Patient would like to try cough medication, CXR and new allergy medicine for now. Counseled she may have to make other adjustments including switching ACE to another medicine.   - DG Chest 2 View; Future - levocetirizine (XYZAL) 5 MG tablet; Take 1 tablet (5 mg total) by mouth every evening.  Dispense: 90 tablet; Refill: 1 - HYDROcodone-homatropine (HYCODAN) 5-1.5 MG/5ML syrup; Take 5 mLs by mouth every 8 (eight) hours as needed for cough.  Dispense: 120 mL; Refill: 0  I discussed the assessment and treatment plan with the patient. The patient was provided an opportunity to ask questions and all were answered. The patient agreed with the plan and demonstrated an understanding of the instructions.   The patient was advised to call back or seek an in-person evaluation if the symptoms worsen or if the condition fails to improve as anticipated.  I provided 25 minutes of non-face-to-face time during  this encounter.  The entirety of the information documented in the History of Present Illness, Review of Systems and Physical Exam were personally obtained by me. Portions of this information were initially documented by [facility] and reviewed by me for thoroughness and accuracy.      Trinna Post, PA-C  [facility]

## 2019-12-20 ENCOUNTER — Ambulatory Visit: Payer: Self-pay

## 2019-12-20 ENCOUNTER — Ambulatory Visit (INDEPENDENT_AMBULATORY_CARE_PROVIDER_SITE_OTHER): Payer: Medicare HMO | Admitting: Physician Assistant

## 2019-12-20 ENCOUNTER — Ambulatory Visit
Admission: RE | Admit: 2019-12-20 | Discharge: 2019-12-20 | Disposition: A | Discharge status: Home / Self Care | Payer: Medicare HMO | Source: Ambulatory Visit | Attending: Physician Assistant | Admitting: Physician Assistant

## 2019-12-20 ENCOUNTER — Encounter: Payer: Self-pay | Admitting: Physician Assistant

## 2019-12-20 ENCOUNTER — Other Ambulatory Visit: Payer: Self-pay

## 2019-12-20 VITALS — BP 137/77 | HR 72 | Temp 96.9°F | Resp 16 | Wt 200.4 lb

## 2019-12-20 DIAGNOSIS — K219 Gastro-esophageal reflux disease without esophagitis: Secondary | ICD-10-CM

## 2019-12-20 DIAGNOSIS — R059 Cough, unspecified: Secondary | ICD-10-CM

## 2019-12-20 DIAGNOSIS — R05 Cough: Secondary | ICD-10-CM | POA: Insufficient documentation

## 2019-12-20 DIAGNOSIS — R350 Frequency of micturition: Secondary | ICD-10-CM

## 2019-12-20 DIAGNOSIS — N3001 Acute cystitis with hematuria: Secondary | ICD-10-CM | POA: Diagnosis not present

## 2019-12-20 LAB — POCT URINALYSIS DIPSTICK
Bilirubin, UA: NEGATIVE
Glucose, UA: NEGATIVE
Ketones, UA: NEGATIVE
Nitrite, UA: POSITIVE
Protein, UA: NEGATIVE
Spec Grav, UA: 1.02 (ref 1.010–1.025)
Urobilinogen, UA: 0.2 E.U./dL
pH, UA: 7.5 (ref 5.0–8.0)

## 2019-12-20 MED ORDER — OMEPRAZOLE 20 MG PO CPDR: 20.0000 mg | DELAYED_RELEASE_CAPSULE | Freq: Two times a day (BID) | ORAL | 1 refills | Status: DC

## 2019-12-20 MED ORDER — CEPHALEXIN 500 MG PO CAPS: 500.0000 mg | ORAL_CAPSULE | Freq: Two times a day (BID) | ORAL | 0 refills | Status: DC

## 2019-12-20 NOTE — Telephone Encounter (Signed)
Returned call to patient.  She states that last night she started to have symptoms of urinary frequency.   She states that it kept her up all night.  She says there is mild burning sensation.  She feels that she is emptying her bladder.  She states that she has just received treatment from Dr Rosanna Randy for cough and diarrhea.  She has just lost her husband and as we were speaking the funeral director was with her to finalize arrangements. She refused virtual visit stating that Dr Rosanna Randy knows her. She has request that I sent the note to office. I discussed the virtual visit with her but she still wishes for Dr Rosanna Randy to evaluate and send her medication. Please advise patient.  Reason for Disposition . Urinating more frequently than usual (i.e., frequency)  Answer Assessment - Initial Assessment Questions 1. SYMPTOM: "What's the main symptom you're concerned about?" (e.g., frequency, incontinence)     frequency 2. ONSET: "When did the frequency start?"    yesterday 3. PAIN: "Is there any pain?" If so, ask: "How bad is it?" (Scale: 1-10; mild, moderate, severe)    Small amount of burning 4. CAUSE: "What do you think is causing the symptoms?"     UTI 5. OTHER SYMPTOMS: "Do you have any other symptoms?" (e.g., fever, flank pain, blood in urine, pain with urination)     No small amount of burning 6. PREGNANCY: "Is there any chance you are pregnant?" "When was your last menstrual period?"     N/A  Protocols used: URINARY [facility]

## 2019-12-20 NOTE — Patient Instructions (Signed)
Fluticasone; Vilanterol inhalation powder What is this medicine? FLUTICASONE; VILANTEROL (floo TIK a sone; vye LAN ter ol) inhalation is a combination of two medicines that decrease inflammation and help to open up the airways of your lungs. It is for chronic obstructive pulmonary disease (COPD), including chronic bronchitis or emphysema. It is also used for asthma in adults to help control symptoms. Do NOT use for an acute asthma attack or COPD attack. This medicine may be used for other purposes; ask your health care provider or pharmacist if you have questions. COMMON BRAND NAME(S): BREO ELLIPTA What should I tell my health care provider before I take this medicine? They need to know if you have any of these conditions:  bone problems  diabetes  eye disease, vision problems  immune system problems  heart disease or irregular heartbeat  high blood pressure  infection  pheochromocytoma  seizures  thyroid disease  an unusual or allergic reaction to fluticasone, vilanterol, milk proteins, corticosteroids, other medicines, foods, dyes, or preservatives  pregnant or trying to get pregnant  breast-feeding How should I use this medicine? This medicine is inhaled through the mouth. It is used once per day. Follow the directions on the prescription label. Do not use a spacer device with this inhaler. Take your medicine at regular intervals. Do not take your medicine more often than directed. Do not stop taking except on your doctor's advice. Make sure that you are using your inhaler correctly. Ask you doctor or health care provider if you have any questions. A special MedGuide will be given to you by the pharmacist with each prescription and refill. Be sure to read this information carefully each time. Talk to your pediatrician regarding the use of this medicine in children. Special care may be needed. This medicine is not approved for use in children under 72 years of age. Overdosage:  If you think you have taken too much of this medicine contact a poison control center or emergency room at once. NOTE: This medicine is only for you. Do not share this medicine with others. What if I miss a dose? If you miss a dose, use it as soon as you can. If it is almost time for your next dose, use only that dose and continue with your regular schedule. Do not use double or extra doses. What may interact with this medicine? Do not take this medicine with any of the following medications:  cisapride  dofetilide  dronedarone  MAOIs like Carbex, Eldepryl, Marplan, Nardil, and Parnate  pimozide  thioridazine  ziprasidone This medicine may also interact with the following medications:  antiviral medicines for HIV or AIDS  beta-blockers like metoprolol and propranolol  certain medicines for depression, anxiety, or psychotic disturbances  certain medicines for fungal infections like ketoconazole, itraconazole, posaconazole, voriconazole  conivaptan  diuretics  medicines for colds  nefazodone  other medicines for breathing problems  other medicines that prolong the QT interval (cause an abnormal heart rhythm) This list may not describe all possible interactions. Give your health care provider a list of all the medicines, herbs, non-prescription drugs, or dietary supplements you use. Also tell them if you smoke, drink alcohol, or use illegal drugs. Some items may interact with your medicine. What should I watch for while using this medicine? Visit your doctor or health care professional for regular checkups. Tell your doctor or health care professional if your symptoms do not get better. Do not use this medicine more than once every 24 hours. NEVER use  this medicine for an acute asthma or COPD attack. You should use your short-acting rescue inhalers for this purpose. If your symptoms get worse or if you need your short-acting inhalers more often, call your doctor right  away. If you are going to have surgery tell your doctor or health care professional that you are using this medicine. Try not to come in contact with people with the chicken pox or measles. If you do, call your doctor. This medicine may increase blood sugar. Ask your healthcare provider if changes in diet or medicines are needed if you have diabetes. What side effects may I notice from receiving this medicine? Side effects that you should report to your doctor or health care professional as soon as possible:  allergic reactions like skin rash or hives, swelling of the face, lips, or tongue  breathing problems right after inhaling your medicine  chest pain  fast, irregular heartbeat  feeling faint or lightheaded, falls  fever or chills  nausea, vomiting  signs and symptoms of high blood sugar such as being more thirsty or hungry or having to urinate more than normal. You may also feel very tired or have blurry vision. Side effects that usually do not require medical attention (report to your doctor or health care professional if they continue or are bothersome):  cough  headache  nervousness  sore throat  tremor This list may not describe all possible side effects. Call your doctor for medical advice about side effects. You may report side effects to FDA at 1-800-FDA-1088. Where should I keep my medicine? Keep out of the reach of children. Store at room temperature between 15 and 30 degrees C (59 and 86 degrees F). Store in a dry place away from direct heat or sunlight. Throw away 6 weeks after you remove the inhaler from the foil tray, or after the dose indicator reads 0, whichever comes first. Throw away any unopened packages after the expiration date. NOTE: This sheet is a summary. It may not cover all possible information. If you have questions about this medicine, talk to your doctor, pharmacist, or health care provider.  2020 Elsevier/Gold Standard (2018-05-30  11:19:39)   Urinary Tract Infection, Adult A urinary tract infection (UTI) is an infection of any part of the urinary tract. The urinary tract includes:  The kidneys.  The ureters.  The bladder.  The urethra. These organs make, store, and get rid of pee (urine) in the body. What are the causes? This is caused by germs (bacteria) in your genital area. These germs grow and cause swelling (inflammation) of your urinary tract. What increases the risk? You are more likely to develop this condition if:  You have a small, thin tube (catheter) to drain pee.  You cannot control when you pee or poop (incontinence).  You are female, and: ? You use these methods to prevent pregnancy:  A medicine that kills sperm (spermicide).  A device that blocks sperm (diaphragm). ? You have low levels of a female hormone (estrogen). ? You are pregnant.  You have genes that add to your risk.  You are sexually active.  You take antibiotic medicines.  You have trouble peeing because of: ? A prostate that is bigger than normal, if you are female. ? A blockage in the part of your body that drains pee from the bladder (urethra). ? A kidney stone. ? A nerve condition that affects your bladder (neurogenic bladder). ? Not getting enough to drink. ? Not peeing often enough.  You have other conditions, such as: ? Diabetes. ? A weak disease-fighting system (immune system). ? Sickle cell disease. ? Gout. ? Injury of the spine. What are the signs or symptoms? Symptoms of this condition include:  Needing to pee right away (urgently).  Peeing often.  Peeing small amounts often.  Pain or burning when peeing.  Blood in the pee.  Pee that smells bad or not like normal.  Trouble peeing.  Pee that is cloudy.  Fluid coming from the vagina, if you are female.  Pain in the belly or lower back. Other symptoms include:  Throwing up (vomiting).  No urge to eat.  Feeling mixed up  (confused).  Being tired and grouchy (irritable).  A fever.  Watery poop (diarrhea). How is this treated? This condition may be treated with:  Antibiotic medicine.  Other medicines.  Drinking enough water. Follow these instructions at home:  Medicines  Take over-the-counter and prescription medicines only as told by your doctor.  If you were prescribed an antibiotic medicine, take it as told by your doctor. Do not stop taking it even if you start to feel better. General instructions  Make sure you: ? Pee until your bladder is empty. ? Do not hold pee for a long time. ? Empty your bladder after sex. ? Wipe from front to back after pooping if you are a female. Use each tissue one time when you wipe.  Drink enough fluid to keep your pee pale yellow.  Keep all follow-up visits as told by your doctor. This is important. Contact a doctor if:  You do not get better after 1-2 days.  Your symptoms go away and then come back. Get help right away if:  You have very bad back pain.  You have very bad pain in your lower belly.  You have a fever.  You are sick to your stomach (nauseous).  You are throwing up. Summary  A urinary tract infection (UTI) is an infection of any part of the urinary tract.  This condition is caused by germs in your genital area.  There are many risk factors for a UTI. These include having a small, thin tube to drain pee and not being able to control when you pee or poop.  Treatment includes antibiotic medicines for germs.  Drink enough fluid to keep your pee pale yellow. This information is not intended to replace advice given to you by your health care provider. Make sure you discuss any questions you have with your health care provider. Document Revised: 08/16/2018 Document Reviewed: 03/08/2018 Elsevier Patient Education  2020 [employer].

## 2019-12-20 NOTE — Progress Notes (Signed)
Patient: Rebecca Patton Female    DOB: 03-Feb-1946   74 y.o.   MRN: [id number] Visit Date: 12/20/2019  Today's Provider: Mar Daring, PA-C   Chief Complaint  Patient presents with  . Urinary Frequency   Subjective:     Urinary Frequency  This is a new problem. The current episode started yesterday (Last night). The problem has been unchanged. The quality of the pain is described as burning ("a little bit of burning"). The patient is experiencing no pain. There has been no fever. Associated symptoms include frequency. Pertinent negatives include no chills, discharge, flank pain, hematuria or urgency. She has tried increased fluids (took AZO) for the symptoms. The treatment provided mild relief.   Does continue to have a chronic cough as well. Cough started in February and has been persistent since. She is on antihistamines, reflux medications and has had a normal CXR just yesterday.  No Known Allergies   Current Outpatient Medications:  .  aspirin 81 MG tablet, Take 81 mg by mouth. , Disp: , Rfl:  .  hydrochlorothiazide (HYDRODIURIL) 25 MG tablet, TAKE 1 TABLET DAILY., Disp: 90 tablet, Rfl: 3 .  HYDROcodone-homatropine (HYCODAN) 5-1.5 MG/5ML syrup, Take 5 mLs by mouth every 8 (eight) hours as needed for cough., Disp: 120 mL, Rfl: 0 .  levocetirizine (XYZAL) 5 MG tablet, Take 1 tablet (5 mg total) by mouth every evening., Disp: 90 tablet, Rfl: 1 .  levothyroxine (SYNTHROID) 50 MCG tablet, TAKE 1 TABLET DAILY BEFORE BREAKFAST., Disp: 90 tablet, Rfl: 3 .  metoprolol succinate (TOPROL-XL) 25 MG 24 hr tablet, TAKE 1 TABLET DAILY . TAKE WITH OR IMMEDIATELY        FOLLOWING A MEAL., Disp: 90 tablet, Rfl: 3 .  MULTIPLE VITAMIN PO, Take by mouth., Disp: , Rfl:  .  OMEGA-3 FATTY ACIDS PO, Take 1,040 mg by mouth daily. , Disp: , Rfl:  .  omeprazole (PRILOSEC) 20 MG capsule, Take 1 capsule (20 mg total) by mouth 2 (two) times daily before a meal., Disp: 180 capsule, Rfl: 1 .  quinapril  (ACCUPRIL) 40 MG tablet, TAKE 1 TABLET DAILY., Disp: 90 tablet, Rfl: 3 .  sertraline (ZOLOFT) 100 MG tablet, Take 1 tablet (100 mg total) by mouth daily., Disp: 90 tablet, Rfl: 3 .  sertraline (ZOLOFT) 50 MG tablet, Take 50 mg by mouth daily., Disp: , Rfl:  .  vitamin B-12 (CYANOCOBALAMIN) 100 MCG tablet, Take 200 mcg by mouth daily., Disp: , Rfl:   Review of Systems  Constitutional: Negative for chills.  HENT: Positive for postnasal drip.   Respiratory: Positive for cough. Negative for chest tightness, shortness of breath and wheezing.   Cardiovascular: Negative.   Gastrointestinal: Negative.   Genitourinary: Positive for frequency. Negative for flank pain, hematuria and urgency.    Social History   Tobacco Use  . Smoking status: Former Smoker    Quit date: 09/11/1997    Years since quitting: 22.2  . Smokeless tobacco: Never Used  Substance Use Topics  . Alcohol use: No    Alcohol/week: 0.0 standard drinks      Objective:   BP 137/77 (BP Location: Left Arm, Patient Position: Sitting, Cuff Size: Large)   Pulse 72   Temp (!) 96.9 F (36.1 C) (Temporal)   Resp 16   Wt 200 lb 6.4 oz (90.9 kg)   BMI 32.35 kg/m  Vitals:   12/20/19 1810  BP: 137/77  Pulse: 72  Resp: 16  Temp: (!)  96.9 F (36.1 C)  TempSrc: Temporal  Weight: 200 lb 6.4 oz (90.9 kg)  Body mass index is 32.35 kg/m.   Physical Exam Constitutional:      General: She is not in acute distress.    Appearance: Normal appearance. She is well-developed. She is obese. She is not ill-appearing or diaphoretic.  Cardiovascular:     Rate and Rhythm: Normal rate and regular rhythm.     Pulses: Normal pulses.     Heart sounds: Normal heart sounds. No murmur. No friction rub. No gallop.   Pulmonary:     Effort: Pulmonary effort is normal. No respiratory distress.     Breath sounds: Normal breath sounds. No wheezing or rales.  Abdominal:     General: Bowel sounds are normal. There is no distension.     Palpations:  Abdomen is soft. There is no mass.     Tenderness: There is abdominal tenderness in the suprapubic area. There is no guarding or rebound.  Skin:    General: Skin is warm and dry.  Neurological:     Mental Status: She is alert and oriented to person, place, and time.      Results for orders placed or performed in visit on 12/20/19  POCT urinalysis dipstick  Result Value Ref Range   Color, UA Yellow    Clarity, UA Cloudy    Glucose, UA Negative Negative   Bilirubin, UA Negative    Ketones, UA Negative    Spec Grav, UA 1.020 1.010 - 1.025   Blood, UA Large    pH, UA 7.5 5.0 - 8.0   Protein, UA Negative Negative   Urobilinogen, UA 0.2 0.2 or 1.0 E.U./dL   Nitrite, UA Positive    Leukocytes, UA Large (3+) (A) Negative   Appearance     Odor         Assessment & Plan    1. Urinary frequency UA positive.  - Urine Culture  2. Acute cystitis with hematuria Worsening symptoms. UA positive. Will treat empirically with Keflex.  Continue to push fluids. Urine sent for culture. Will follow up pending C&S results. She is to call if symptoms do not improve or if they worsen.  - cephALEXin (KEFLEX) 500 MG capsule; Take 1 capsule (500 mg total) by mouth 2 (two) times daily.  Dispense: 14 capsule; Refill: 0  3. Gastroesophageal reflux disease Stable. Diagnosis pulled for medication refill. Continue current medical treatment plan. - omeprazole (PRILOSEC) 20 MG capsule; Take 1 capsule (20 mg total) by mouth 2 (two) times daily before a meal.  Dispense: 180 capsule; Refill: 1  4. Cough CXR was normal. Will try Breo inhaler, sample given to start, to see if symptoms improve. Call if helping and will prescribe, if not helping may discontinue.      Mar Daring, PA-C  [facility]

## 2019-12-20 NOTE — Telephone Encounter (Signed)
Virtual or she may wait until Dr. Rosanna Randy returns as he is out of the office.

## 2019-12-23 ENCOUNTER — Encounter: Payer: Self-pay | Admitting: Physician Assistant

## 2019-12-23 ENCOUNTER — Ambulatory Visit (INDEPENDENT_AMBULATORY_CARE_PROVIDER_SITE_OTHER): Payer: Medicare HMO | Admitting: Physician Assistant

## 2019-12-23 ENCOUNTER — Other Ambulatory Visit: Payer: Self-pay | Admitting: Physician Assistant

## 2019-12-23 ENCOUNTER — Telehealth: Payer: Self-pay

## 2019-12-23 ENCOUNTER — Other Ambulatory Visit: Payer: Self-pay

## 2019-12-23 VITALS — BP 135/68 | HR 64 | Temp 96.8°F | Resp 16 | Wt 202.2 lb

## 2019-12-23 DIAGNOSIS — H6121 Impacted cerumen, right ear: Secondary | ICD-10-CM

## 2019-12-23 DIAGNOSIS — I10 Essential (primary) hypertension: Secondary | ICD-10-CM | POA: Diagnosis not present

## 2019-12-23 DIAGNOSIS — R350 Frequency of micturition: Secondary | ICD-10-CM | POA: Diagnosis not present

## 2019-12-23 DIAGNOSIS — C44722 Squamous cell carcinoma of skin of right lower limb, including hip: Secondary | ICD-10-CM | POA: Diagnosis not present

## 2019-12-23 DIAGNOSIS — R0982 Postnasal drip: Secondary | ICD-10-CM

## 2019-12-23 MED ORDER — FLUTICASONE PROPIONATE 50 MCG/ACT NA SUSP: 1.0000 | Freq: Every day | NASAL | 2 refills | Status: DC

## 2019-12-23 MED ORDER — LOSARTAN POTASSIUM 100 MG PO TABS: 100.0000 mg | ORAL_TABLET | Freq: Every day | ORAL | 1 refills | Status: DC

## 2019-12-23 NOTE — Patient Instructions (Addendum)
Fluticasone nasal spray What is this medicine? FLUTICASONE (floo TIK a sone) is a corticosteroid. This medicine is used to treat the symptoms of allergies like sneezing, itchy red eyes, and itchy, runny, or stuffy nose. This medicine is also used to treat nasal polyps. This medicine may be used for other purposes; ask your health care provider or pharmacist if you have questions. COMMON BRAND NAME(S): ClariSpray, Flonase, Flonase Allergy Relief, Flonase Sensimist, Veramyst, XHANCE What should I tell my health care provider before I take this medicine? They need to know if you have any of these conditions:  eye disease, vision problems  infection, like tuberculosis, herpes, or fungal infection  recent surgery on nose or sinuses  taking a corticosteroid by mouth  an unusual or allergic reaction to fluticasone, steroids, other medicines, foods, dyes, or preservatives  pregnant or trying to get pregnant  breast-feeding How should I use this medicine? This medicine is for use in the nose. Follow the directions on your product or prescription label. This medicine works best if used at regular intervals. Do not use more often than directed. Make sure that you are using your nasal spray correctly. After 6 months of daily use for allergies, talk to your doctor or health care professional before using it for a longer time. Ask your doctor or health care professional if you have any questions. Talk to your pediatrician regarding the use of this medicine in children. Special care may be needed. Some products have been used for allergies in children as young as 2 years. After 2 months of daily use without a prescription in a child, talk to your pediatrician before using it for a longer time. Use of this medicine for nasal polyps is not approved in children. Overdosage: If you think you have taken too much of this medicine contact a poison control center or emergency room at once. NOTE: This medicine is  only for you. Do not share this medicine with others. What if I miss a dose? If you miss a dose, use it as soon as you remember. If it is almost time for your next dose, use only that dose and continue with your regular schedule. Do not use double or extra doses. What may interact with this medicine?  certain antibiotics like clarithromycin and telithromycin  certain medicines for fungal infections like ketoconazole, itraconazole, and voriconazole  conivaptan  nefazodone  some medicines for HIV  vaccines This list may not describe all possible interactions. Give your health care provider a list of all the medicines, herbs, non-prescription drugs, or dietary supplements you use. Also tell them if you smoke, drink alcohol, or use illegal drugs. Some items may interact with your medicine. What should I watch for while using this medicine? Visit your healthcare professional for regular checks on your progress. Tell your healthcare professional if your symptoms do not start to get better or if they get worse. This medicine may increase your risk of getting an infection. Tell your doctor or health care professional if you are around anyone with measles or chickenpox, or if you develop sores or blisters that do not heal properly. What side effects may I notice from receiving this medicine? Side effects that you should report to your doctor or health care professional as soon as possible:  allergic reactions like skin rash, itching or hives, swelling of the face, lips, or tongue  changes in vision  crusting or sores in the nose  nosebleed  signs and symptoms of infection like  fever or chills; cough; sore throat  white patches or sores in the mouth or nose Side effects that usually do not require medical attention (report to your doctor or health care professional if they continue or are bothersome):  burning or irritation inside the nose or throat  changes in taste or  smell  cough  headache This list may not describe all possible side effects. Call your doctor for medical advice about side effects. You may report side effects to FDA at 1-800-FDA-1088. Where should I keep my medicine? Keep out of the reach of children. Store at room temperature between 15 and 30 degrees C (59 and 86 degrees F). Avoid exposure to extreme heat, cold, or light. Throw away any unused medicine after the expiration date. NOTE: This sheet is a summary. It may not cover all possible information. If you have questions about this medicine, talk to your doctor, pharmacist, or health care provider.  2020 Elsevier/Gold Standard (2017-09-21 14:10:08)  Losartan Tablets What is this medicine? LOSARTAN (loe SAR tan) is an angiotensin II receptor blocker, also known as an ARB. It treats high blood pressure. It can slow kidney damage in some patients. It may also be used to lower the risk of stroke. This medicine may be used for other purposes; ask your health care provider or pharmacist if you have questions. COMMON BRAND NAME(S): Cozaar What should I tell my health care provider before I take this medicine? They need to know if you have any of these conditions:  heart failure  kidney or liver disease  an unusual or allergic reaction to losartan, other medicines, foods, dyes, or preservatives  pregnant or trying to get pregnant  breast-feeding How should I use this medicine? Take this drug by mouth. Take it as directed on the prescription label at the same time every day. You can take it with or without food. If it upsets your stomach, take it with food. Keep taking it unless your health care provider tells you to stop. Talk to your health care provider about the use of this drug in children. While it may be prescribed for children as young as 6 for selected conditions, precautions do apply. Overdosage: If you think you have taken too much of this medicine contact a poison control  center or emergency room at once. NOTE: This medicine is only for you. Do not share this medicine with others. What if I miss a dose? If you miss a dose, take it as soon as you can. If it is almost time for your next dose, take only that dose. Do not take double or extra doses. What may interact with this medicine?  blood pressure medicines  diuretics, especially triamterene, spironolactone, or amiloride  fluconazole  NSAIDs, medicines for pain and inflammation, like ibuprofen or naproxen  potassium salts or potassium supplements  rifampin This list may not describe all possible interactions. Give your health care provider a list of all the medicines, herbs, non-prescription drugs, or dietary supplements you use. Also tell them if you smoke, drink alcohol, or use illegal drugs. Some items may interact with your medicine. What should I watch for while using this medicine? Visit your doctor or health care professional for regular checks on your progress. Check your blood pressure as directed. Ask your doctor or health care professional what your blood pressure should be and when you should contact him or her. Call your doctor or health care professional if you notice an irregular or fast heart beat. Women  should inform their doctor if they wish to become pregnant or think they might be pregnant. There is a potential for serious side effects to an unborn child, particularly in the second or third trimester. Talk to your health care professional or pharmacist for more information. You may get drowsy or dizzy. Do not drive, use machinery, or do anything that needs mental alertness until you know how this drug affects you. Do not stand or sit up quickly, especially if you are an older patient. This reduces the risk of dizzy or fainting spells. Alcohol can make you more drowsy and dizzy. Avoid alcoholic drinks. Avoid salt substitutes unless you are told otherwise by your doctor or health care  professional. Do not treat yourself for coughs, colds, or pain while you are taking this medicine without asking your doctor or health care professional for advice. Some ingredients may increase your blood pressure. What side effects may I notice from receiving this medicine? Side effects that you should report to your doctor or health care professional as soon as possible:  confusion, dizziness, light headedness or fainting spells  decreased amount of urine passed  difficulty breathing or swallowing, hoarseness, or tightening of the throat  fast or irregular heart beat, palpitations, or chest pain  skin rash, itching  swelling of your face, lips, tongue, hands, or feet Side effects that usually do not require medical attention (report to your doctor or health care professional if they continue or are bothersome):  cough  decreased sexual function or desire  headache  nasal congestion or stuffiness  nausea or stomach pain  sore or cramping muscles This list may not describe all possible side effects. Call your doctor for medical advice about side effects. You may report side effects to FDA at 1-800-FDA-1088. Where should I keep my medicine? Keep out of the reach of children and pets. Store at room temperature between 15 and 30 degrees C (59 and 86 degrees F). Protect from light. Keep the container tightly closed. Throw away any unused drug after the expiration date. NOTE: This sheet is a summary. It may not cover all possible information. If you have questions about this medicine, talk to your doctor, pharmacist, or health care provider.  2020 Elsevier/Gold Standard (2019-04-03 12:12:28)

## 2019-12-23 NOTE — Telephone Encounter (Signed)
Done.patient scheduled for today

## 2019-12-23 NOTE — Telephone Encounter (Signed)
Requested Prescriptions  Pending Prescriptions Disp Refills  . losartan (COZAAR) 100 MG tablet [Pharmacy Med Name: LOSARTAN 100MG  TABLETS] 90 tablet 1    Sig: TAKE 1 TABLET(100 MG) BY MOUTH DAILY     Cardiovascular:  Angiotensin Receptor Blockers Passed - 12/23/2019  7:42 PM      Passed - Cr in normal range and within 180 days    Creatinine  Date Value Ref Range Status  03/25/2012 0.89 0.60 - 1.30 mg/dL Final   Creatinine, Ser  Date Value Ref Range Status  10/17/2019 0.92 0.57 - 1.00 mg/dL Final         Passed - K in normal range and within 180 days    Potassium  Date Value Ref Range Status  10/17/2019 3.9 3.5 - 5.2 mmol/L Final  03/25/2012 3.6 3.5 - 5.1 mmol/L Final         Passed - Patient is not pregnant      Passed - Last BP in normal range    BP Readings from Last 1 Encounters:  12/23/19 135/68         Passed - Valid encounter within last 6 months    Recent Outpatient Visits          Today Post-nasal drainage   [place], [place]   3 days ago Acute cystitis with hematuria   [facility] Fenton Malling M, [place]   4 days ago Nasal congestion   [place], [place], [place]   1 month ago Gastritis, presence of bleeding unspecified, unspecified chronicity, unspecified gastritis type   [facility] Jerrol Banana., MD   1 month ago Cough   [facility] Jerrol Banana., MD      Future Appointments            In 3 days Jerrol Banana., MD [facility], [place]   In 1 week Marlyn Corporal, Clearnce Sorrel, PA-C [facility], [facility]           Patient requested a 90 day supply.

## 2019-12-23 NOTE — Progress Notes (Signed)
Established patient visit      Patient: Rebecca Patton   DOB: [birth year]   74 y.o. Female  MRN: [id number] Visit Date: 12/23/2019  Today's healthcare provider: Mar Daring, PA-C  Subjective:    Chief Complaint  Patient presents with  . Cyst   HPI Patient here today with c/o feeling a lump in her throat. She was seen last Friday 12/20/19 for UTI symptoms. UCX still pending. She also had a virtual visit last week on Thursday 12/19/19 for cough. Chest xray was done and it was normal.  Patient Active Problem List   Diagnosis Date Noted  . Incisional hernia, without obstruction or gangrene   . Supraumbilical hernia [id number]  . Anxiety 03/20/2015  . Arthritis 03/20/2015  . 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 Clinical depression 03/20/2015  . Malignant neoplasm of corpus uteri, except isthmus ([place]) 03/20/2015  . Essential (primary) hypertension 03/20/2015  . HLD (hyperlipidemia) 03/20/2015  . Cannot sleep 03/20/2015  . Osteopenia 03/20/2015  . Adult hypothyroidism 03/20/2015   Past Medical History:  Diagnosis Date  . Arthritis   . Depression   . Dysrhythmia    ST  . High blood pressure   . Hypothyroidism   . MRSA infection 2006   on nose  . Uterine cancer ([place]) 2009   No Known Allergies     Medications: Outpatient Medications Prior to Visit  Medication Sig  . aspirin 81 MG tablet Take 81 mg by mouth.   . cephALEXin (KEFLEX) 500 MG capsule Take 1 capsule (500 mg total) by mouth 2 (two) times daily.  . hydrochlorothiazide (HYDRODIURIL) 25 MG tablet TAKE 1 TABLET DAILY.  Marland Kitchen HYDROcodone-homatropine (HYCODAN) 5-1.5 MG/5ML syrup Take 5 mLs by mouth every 8 (eight) hours as needed for cough.  . levocetirizine (XYZAL) 5 MG tablet Take 1 tablet (5 mg total) by mouth every evening.  Marland Kitchen levothyroxine (SYNTHROID) 50 MCG tablet TAKE 1 TABLET DAILY BEFORE BREAKFAST.  . metoprolol succinate (TOPROL-XL) 25 MG 24 hr tablet TAKE 1 TABLET DAILY . TAKE WITH OR IMMEDIATELY         FOLLOWING A MEAL.  . MULTIPLE VITAMIN PO Take by mouth.  . OMEGA-3 FATTY ACIDS PO Take 1,040 mg by mouth daily.   Marland Kitchen omeprazole (PRILOSEC) 20 MG capsule Take 1 capsule (20 mg total) by mouth 2 (two) times daily before a meal.  . quinapril (ACCUPRIL) 40 MG tablet TAKE 1 TABLET DAILY.  Marland Kitchen sertraline (ZOLOFT) 100 MG tablet Take 1 tablet (100 mg total) by mouth daily.  . sertraline (ZOLOFT) 50 MG tablet Take 50 mg by mouth daily.  . vitamin B-12 (CYANOCOBALAMIN) 100 MCG tablet Take 200 mcg by mouth daily.   No facility-administered medications prior to visit.    Review of Systems  Constitutional: Negative.   HENT: Positive for congestion, postnasal drip and sneezing (occasional; improved with xyzal). Negative for rhinorrhea, sinus pressure and sinus pain.   Respiratory: Positive for cough. Negative for chest tightness, shortness of breath and wheezing.   Cardiovascular: Negative for chest pain, palpitations and leg swelling.  Genitourinary: Negative for dysuria and frequency.  Neurological: Negative for headaches.    Last CBC Lab Results  Component Value Date   WBC 7.2 10/17/2019   HGB 14.6 10/17/2019   HCT 44.3 10/17/2019   MCV 87 10/17/2019   MCH 28.7 10/17/2019   RDW 13.4 10/17/2019   PLT 266 [id number]   Last metabolic panel Lab Results  Component Value Date   GLUCOSE  88 10/17/2019   NA 141 10/17/2019   K 3.9 10/17/2019   CL 102 10/17/2019   CO2 23 10/17/2019   BUN 15 10/17/2019   CREATININE 0.92 10/17/2019   GFRNONAA 63 10/17/2019   GFRAA 72 10/17/2019   CALCIUM 9.3 10/17/2019   PROT 6.5 10/17/2019   ALBUMIN 4.0 10/17/2019   LABGLOB 2.5 10/17/2019   AGRATIO 1.6 10/17/2019   BILITOT 0.3 10/17/2019   ALKPHOS 75 10/17/2019   AST 26 10/17/2019   ALT 25 10/17/2019   ANIONGAP 8 05/16/2018   Last hemoglobin A1c Lab Results  Component Value Date   HGBA1C 5.6 05/11/2016   Last thyroid functions Lab Results  Component Value Date   TSH 3.880 03/28/2019         Objective:    BP 135/68 (BP Location: Left Arm, Patient Position: Sitting, Cuff Size: Large)   Pulse 64   Temp (!) 96.8 F (36 C) (Temporal)   Resp 16   Wt 202 lb 3.2 oz (91.7 kg)   BMI 32.64 kg/m  BP Readings from Last 3 Encounters:  12/23/19 135/68  12/20/19 137/77  11/21/19 132/70   Wt Readings from Last 3 Encounters:  12/23/19 202 lb 3.2 oz (91.7 kg)  12/20/19 200 lb 6.4 oz (90.9 kg)  11/21/19 206 lb (93.4 kg)      Physical Exam Vitals reviewed.  Constitutional:      General: She is not in acute distress.    Appearance: Normal appearance. She is well-developed. She is not ill-appearing or diaphoretic.  HENT:     Head: Normocephalic and atraumatic.     Right Ear: Hearing, tympanic membrane, ear canal and external ear normal. There is impacted cerumen.     Left Ear: Hearing, tympanic membrane, ear canal and external ear normal.     Nose: Nose normal. No congestion.     Mouth/Throat:     Mouth: Mucous membranes are moist.     Pharynx: Oropharynx is clear. Uvula midline. Posterior oropharyngeal erythema (cobblestoning c/w drainage) present. No oropharyngeal exudate.     Comments: Patient has had throat surgery for sinus issues in the past so anatomy is distorted Eyes:     General: No scleral icterus.       Right eye: No discharge.        Left eye: No discharge.     Extraocular Movements: Extraocular movements intact.     Conjunctiva/sclera: Conjunctivae normal.     Pupils: Pupils are equal, round, and reactive to light.  Neck:     Thyroid: No thyromegaly.     Trachea: No tracheal deviation.  Cardiovascular:     Rate and Rhythm: Normal rate and regular rhythm.     Heart sounds: Normal heart sounds. No murmur. No friction rub. No gallop.   Pulmonary:     Effort: Pulmonary effort is normal. No respiratory distress.     Breath sounds: Normal breath sounds. No stridor. No wheezing or rales.  Musculoskeletal:     Cervical back: Normal range of motion and neck supple.    Lymphadenopathy:     Cervical: No cervical adenopathy.  Skin:    General: Skin is warm and dry.  Neurological:     Mental Status: She is alert.       No results found for any visits on 12/23/19.    Assessment & Plan:    1. Post-nasal drainage Suspect symptoms are secondary to post nasal drainage. Will start flonase as below. Continue Xyzal. Push fluids. Call if  not improving. - fluticasone (FLONASE) 50 MCG/ACT nasal spray; Place 1 spray into both nostrils daily.  Dispense: 48 g; Refill: 2  2. Essential hypertension Patient is on quinapril 40mg  and has been having a chronic cough for the last 3-4 months. Will stop Quinapril and start Losartan 100mg . If cough improves will continue therapy with losartan. If cough does not improve may change back to quinapril if patient desires.   3. Impacted cerumen of right ear Lavage successful today.  - Ear Lavage      Rubye Beach  [facility] [phone] (phone) [phone] (fax)  [place]

## 2019-12-23 NOTE — Telephone Encounter (Signed)
Copied from [place] [id number]. Topic: General - Other >> Dec 23, 2019 10:15 AM Greggory Keen D wrote: Reason for CRM: pt called saying she has not heard back from her cxr on Friday and she isn't feeling any better as far as her cough goes and a lump feeling in her throat.  I made her an appt with Tawanna Sat at 6pm  She has had both Covid Vaccines over two weeks ago

## 2019-12-25 LAB — URINE CULTURE

## 2019-12-26 ENCOUNTER — Ambulatory Visit: Payer: Self-pay | Admitting: Family Medicine

## 2019-12-27 NOTE — Progress Notes (Signed)
Established patient visit     Patient: Rebecca Patton   DOB: 21-Feb-1946   74 y.o. Female  MRN: [id number] Visit Date: 12/30/2019  Today's healthcare provider: Mar Daring, PA-C  Subjective:    Chief Complaint  Patient presents with  . Abdominal Pain  . Follow-up    cough  -Has colonoscopy scheduled 03/02/20 (Dr. Alice Reichert)  HPI Abdominal Pain Patient reports that she has been having pain off an on since her hernia surgery and want the provider to tell her what it can be. Reports that she was hurting so bad one day last week, but is better today. Feels may have been aggravated from the cough. Does have known gallstones, but they have not been problematic.   Associated symptoms: No anorexia   No belching   No bloody stool  No blood in urine  No constipation  No diarrhea No dysuria No fever No flatus No headaches No joint pains No myalgias No nausea No vomiting No weight loss   Previous labs Lab Results  Component Value Date   WBC 7.2 10/17/2019   HGB 14.6 10/17/2019   HCT 44.3 10/17/2019   MCV 87 10/17/2019   MCH 28.7 10/17/2019   RDW 13.4 10/17/2019   PLT 266 07/16/2018   Lab Results  Component Value Date   GLUCOSE 88 10/17/2019   NA 141 10/17/2019   K 3.9 10/17/2019   CL 102 10/17/2019   CO2 23 10/17/2019   BUN 15 10/17/2019   CREATININE 0.92 10/17/2019   GFRNONAA 63 10/17/2019   GFRAA 72 10/17/2019   CALCIUM 9.3 10/17/2019   PROT 6.5 10/17/2019   ALBUMIN 4.0 10/17/2019   LABGLOB 2.5 10/17/2019   AGRATIO 1.6 10/17/2019   BILITOT 0.3 10/17/2019   ALKPHOS 75 10/17/2019   AST 26 10/17/2019   ALT 25 10/17/2019   ANIONGAP 8 05/16/2018   Lab Results  Component Value Date   AMYLASE 41 10/17/2019   -----------------------------------------------------------------------------------------  Cough: Reports that her cough is much better. The Floanse and change to losartan from quinapril is helping.   Patient Active Problem List   Diagnosis  Date Noted  . Incisional hernia, without obstruction or gangrene   . Supraumbilical hernia [id number]  . Anxiety 03/20/2015  . Arthritis 03/20/2015  . 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 Clinical depression 03/20/2015  . Malignant neoplasm of corpus uteri, except isthmus ([place]) 03/20/2015  . Essential (primary) hypertension 03/20/2015  . HLD (hyperlipidemia) 03/20/2015  . Cannot sleep 03/20/2015  . Osteopenia 03/20/2015  . Adult hypothyroidism 03/20/2015   Past Medical History:  Diagnosis Date  . Arthritis   . Depression   . Dysrhythmia    ST  . High blood pressure   . Hypothyroidism   . MRSA infection 2006   on nose  . Uterine cancer ([place]) 2009   No Known Allergies     Medications: Outpatient Medications Prior to Visit  Medication Sig  . aspirin 81 MG tablet Take 81 mg by mouth.   . cephALEXin (KEFLEX) 500 MG capsule Take 1 capsule (500 mg total) by mouth 2 (two) times daily.  . fluticasone (FLONASE) 50 MCG/ACT nasal spray Place 1 spray into both nostrils daily.  . hydrochlorothiazide (HYDRODIURIL) 25 MG tablet TAKE 1 TABLET DAILY.  Marland Kitchen HYDROcodone-homatropine (HYCODAN) 5-1.5 MG/5ML syrup Take 5 mLs by mouth every 8 (eight) hours as needed for cough.  . levocetirizine (XYZAL) 5 MG tablet Take 1 tablet (5 mg total) by mouth every evening.  Marland Kitchen  levothyroxine (SYNTHROID) 50 MCG tablet TAKE 1 TABLET DAILY BEFORE BREAKFAST.  Marland Kitchen losartan (COZAAR) 100 MG tablet TAKE 1 TABLET(100 MG) BY MOUTH DAILY  . metoprolol succinate (TOPROL-XL) 25 MG 24 hr tablet TAKE 1 TABLET DAILY . TAKE WITH OR IMMEDIATELY        FOLLOWING A MEAL.  . MULTIPLE VITAMIN PO Take by mouth.  . OMEGA-3 FATTY ACIDS PO Take 1,040 mg by mouth daily.   Marland Kitchen omeprazole (PRILOSEC) 20 MG capsule Take 1 capsule (20 mg total) by mouth 2 (two) times daily before a meal.  . sertraline (ZOLOFT) 100 MG tablet Take 1 tablet (100 mg total) by mouth daily.  . sertraline (ZOLOFT) 50 MG tablet Take 50 mg by mouth daily.  . vitamin  B-12 (CYANOCOBALAMIN) 100 MCG tablet Take 200 mcg by mouth daily.   No facility-administered medications prior to visit.    Review of Systems  Constitutional: Negative.   Respiratory: Negative.   Cardiovascular: Negative.   Gastrointestinal: Positive for abdominal pain. Negative for abdominal distention, blood in stool, constipation, diarrhea, nausea and vomiting.  Genitourinary: Negative.   Musculoskeletal: Negative.   Skin: Negative for rash.  Neurological: Negative.     Last CBC Lab Results  Component Value Date   WBC 7.2 10/17/2019   HGB 14.6 10/17/2019   HCT 44.3 10/17/2019   MCV 87 10/17/2019   MCH 28.7 10/17/2019   RDW 13.4 10/17/2019   PLT 266 [id number]   Last metabolic panel Lab Results  Component Value Date   GLUCOSE 88 10/17/2019   NA 141 10/17/2019   K 3.9 10/17/2019   CL 102 10/17/2019   CO2 23 10/17/2019   BUN 15 10/17/2019   CREATININE 0.92 10/17/2019   GFRNONAA 63 10/17/2019   GFRAA 72 10/17/2019   CALCIUM 9.3 10/17/2019   PROT 6.5 10/17/2019   ALBUMIN 4.0 10/17/2019   LABGLOB 2.5 10/17/2019   AGRATIO 1.6 10/17/2019   BILITOT 0.3 10/17/2019   ALKPHOS 75 10/17/2019   AST 26 10/17/2019   ALT 25 10/17/2019   ANIONGAP 8 05/16/2018   Last hemoglobin A1c Lab Results  Component Value Date   HGBA1C 5.6 05/11/2016   Last thyroid functions Lab Results  Component Value Date   TSH 3.880 03/28/2019        Objective:    BP 117/66 (BP Location: Left Arm, Patient Position: Sitting, Cuff Size: Large)   Pulse 60   Temp (!) 96.9 F (36.1 C) (Temporal)   Resp 16   Wt 197 lb 12.8 oz (89.7 kg)   BMI 31.93 kg/m  BP Readings from Last 3 Encounters:  12/30/19 117/66  12/23/19 135/68  12/20/19 137/77   Wt Readings from Last 3 Encounters:  12/30/19 197 lb 12.8 oz (89.7 kg)  12/23/19 202 lb 3.2 oz (91.7 kg)  12/20/19 200 lb 6.4 oz (90.9 kg)      Physical Exam Vitals reviewed.  Constitutional:      General: She is not in acute distress.     Appearance: Normal appearance. She is well-developed. She is obese. She is not ill-appearing or diaphoretic.  Cardiovascular:     Rate and Rhythm: Normal rate and regular rhythm.     Heart sounds: Normal heart sounds. No murmur. No friction rub. No gallop.   Pulmonary:     Effort: Pulmonary effort is normal. No respiratory distress.     Breath sounds: Normal breath sounds. No wheezing or rales.  Abdominal:     General: Abdomen is flat. Bowel sounds  are normal. There is no distension.     Palpations: Abdomen is soft. There is no mass.     Tenderness: There is abdominal tenderness in the periumbilical area. There is no right CVA tenderness, left CVA tenderness, guarding or rebound. Negative signs include Murphy's sign.     Hernia: A hernia is present. Hernia is present in the umbilical area (s/p repair).  Skin:    General: Skin is warm and dry.  Neurological:     Mental Status: She is alert and oriented to person, place, and time.      No results found for any visits on 12/30/19.    Assessment & Plan:    1. Cough Improved. Continue Flonase and Xyzal for allergies. Will continue Losartan as BP is well controlled.   2. Essential hypertension Stable. Diagnosis pulled for medication refill. Continue current medical treatment plan. - losartan (COZAAR) 100 MG tablet; TAKE 1 TABLET(100 MG) BY MOUTH DAILY  Dispense: 90 tablet; Refill: 3  3. Periumbilical abdominal pain Patient has h/o supra-umbilical hernia repair. No hernia felt on exam today. Suspect irritation of the mesh and abdominal wall fascia secondary to coughing. Advised to monitor and call if pain persists, worsens, or is associated with lack of BM or ability to pass gas. Also advised to call if pain is associated with eating as this may indicate more gallstones issues. Currently it has been localized to over the area of hernia repair, not in the RUQ. She voices understanding.    No follow-ups on file.     Reynolds Bowl, PA-C, have reviewed all documentation for this visit. The documentation on 12/30/19 for the exam, diagnosis, procedures, and orders are all accurate and complete.   Rubye Beach  [facility] [phone] (phone) [phone] (fax)  [place]

## 2019-12-30 ENCOUNTER — Encounter: Payer: Self-pay | Admitting: Physician Assistant

## 2019-12-30 ENCOUNTER — Other Ambulatory Visit: Payer: Self-pay

## 2019-12-30 ENCOUNTER — Ambulatory Visit (INDEPENDENT_AMBULATORY_CARE_PROVIDER_SITE_OTHER): Payer: Medicare HMO | Admitting: Physician Assistant

## 2019-12-30 VITALS — BP 117/66 | HR 60 | Temp 96.9°F | Resp 16 | Wt 197.8 lb

## 2019-12-30 DIAGNOSIS — R1033 Periumbilical pain: Secondary | ICD-10-CM | POA: Diagnosis not present

## 2019-12-30 DIAGNOSIS — I10 Essential (primary) hypertension: Secondary | ICD-10-CM | POA: Diagnosis not present

## 2019-12-30 DIAGNOSIS — R059 Cough, unspecified: Secondary | ICD-10-CM

## 2019-12-30 DIAGNOSIS — R05 Cough: Secondary | ICD-10-CM | POA: Diagnosis not present

## 2019-12-30 MED ORDER — LOSARTAN POTASSIUM 100 MG PO TABS: ORAL_TABLET | ORAL | 3 refills | Status: DC

## 2020-01-21 ENCOUNTER — Other Ambulatory Visit
Admission: RE | Admit: 2020-01-21 | Discharge: 2020-01-21 | Disposition: A | Discharge status: Home / Self Care | Payer: Medicare HMO | Source: Ambulatory Visit | Attending: Internal Medicine | Admitting: Internal Medicine

## 2020-01-21 DIAGNOSIS — Z20822 Contact with and (suspected) exposure to covid-19: Secondary | ICD-10-CM | POA: Insufficient documentation

## 2020-01-21 DIAGNOSIS — Z01812 Encounter for preprocedural laboratory examination: Secondary | ICD-10-CM | POA: Diagnosis not present

## 2020-01-21 LAB — SARS CORONAVIRUS 2 (TAT 6-24 HRS): SARS Coronavirus 2: NEGATIVE

## 2020-01-23 ENCOUNTER — Ambulatory Visit: Payer: Medicare HMO | Admitting: Anesthesiology

## 2020-01-23 ENCOUNTER — Encounter: Payer: Self-pay | Admitting: Internal Medicine

## 2020-01-23 ENCOUNTER — Ambulatory Visit
Admission: RE | Admit: 2020-01-23 | Discharge: 2020-01-23 | Disposition: A | Discharge status: Home / Self Care | Payer: Medicare HMO | Attending: Internal Medicine | Admitting: Internal Medicine

## 2020-01-23 ENCOUNTER — Encounter
Admission: RE | Disposition: A | Discharge status: Home / Self Care | Payer: Self-pay | Source: Home / Self Care | Attending: Internal Medicine

## 2020-01-23 ENCOUNTER — Other Ambulatory Visit: Payer: Self-pay

## 2020-01-23 DIAGNOSIS — F329 Major depressive disorder, single episode, unspecified: Secondary | ICD-10-CM | POA: Diagnosis not present

## 2020-01-23 DIAGNOSIS — E039 Hypothyroidism, unspecified: Secondary | ICD-10-CM | POA: Insufficient documentation

## 2020-01-23 DIAGNOSIS — R69 Illness, unspecified: Secondary | ICD-10-CM | POA: Diagnosis not present

## 2020-01-23 DIAGNOSIS — Z79899 Other long term (current) drug therapy: Secondary | ICD-10-CM | POA: Diagnosis not present

## 2020-01-23 DIAGNOSIS — K649 Unspecified hemorrhoids: Secondary | ICD-10-CM | POA: Diagnosis not present

## 2020-01-23 DIAGNOSIS — K640 First degree hemorrhoids: Secondary | ICD-10-CM | POA: Diagnosis not present

## 2020-01-23 DIAGNOSIS — R197 Diarrhea, unspecified: Secondary | ICD-10-CM | POA: Diagnosis not present

## 2020-01-23 DIAGNOSIS — K591 Functional diarrhea: Secondary | ICD-10-CM | POA: Diagnosis not present

## 2020-01-23 DIAGNOSIS — K635 Polyp of colon: Secondary | ICD-10-CM | POA: Diagnosis not present

## 2020-01-23 DIAGNOSIS — K5790 Diverticulosis of intestine, part unspecified, without perforation or abscess without bleeding: Secondary | ICD-10-CM | POA: Diagnosis not present

## 2020-01-23 DIAGNOSIS — M1990 Unspecified osteoarthritis, unspecified site: Secondary | ICD-10-CM | POA: Diagnosis not present

## 2020-01-23 DIAGNOSIS — D125 Benign neoplasm of sigmoid colon: Secondary | ICD-10-CM | POA: Diagnosis not present

## 2020-01-23 DIAGNOSIS — D126 Benign neoplasm of colon, unspecified: Secondary | ICD-10-CM | POA: Insufficient documentation

## 2020-01-23 DIAGNOSIS — Z79890 Hormone replacement therapy (postmenopausal): Secondary | ICD-10-CM | POA: Insufficient documentation

## 2020-01-23 DIAGNOSIS — Z7982 Long term (current) use of aspirin: Secondary | ICD-10-CM | POA: Insufficient documentation

## 2020-01-23 DIAGNOSIS — K5730 Diverticulosis of large intestine without perforation or abscess without bleeding: Secondary | ICD-10-CM | POA: Diagnosis not present

## 2020-01-23 DIAGNOSIS — D128 Benign neoplasm of rectum: Secondary | ICD-10-CM | POA: Diagnosis not present

## 2020-01-23 HISTORY — PX: COLONOSCOPY WITH PROPOFOL: SHX5780

## 2020-01-23 LAB — HM COLONOSCOPY

## 2020-01-23 SURGERY — COLONOSCOPY WITH PROPOFOL
Anesthesia: General

## 2020-01-23 MED ORDER — PROPOFOL 500 MG/50ML IV EMUL
INTRAVENOUS | Status: DC | PRN
  Administered 2020-01-23: 120 ug/kg/min via INTRAVENOUS

## 2020-01-23 MED ORDER — SODIUM CHLORIDE 0.9 % IV SOLN
INTRAVENOUS | Status: DC
  Administered 2020-01-23: 1000 mL via INTRAVENOUS

## 2020-01-23 MED ORDER — PROPOFOL 500 MG/50ML IV EMUL
INTRAVENOUS | Status: AC
  Filled 2020-01-23: qty 50

## 2020-01-23 NOTE — H&P (Signed)
Outpatient short stay form Pre-procedure 01/23/2020 9:36 AM Rebecca Dingus K. Alice Reichert, M.D.  Primary Physician: Miguel Aschoff, M.D.  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  History of present illness:  74 y/o female presents with chronic intermittent diarrhea with negative workup for infection and gross inflammation. Fecal calprotectin was at the ULN, however, at 107 suggesting possible mild colonic inflammation.    Current Facility-Administered Medications:  .  0.9 %  sodium chloride infusion, , Intravenous, Continuous, Atkins, Benay Pike, MD, Last Rate: 20 mL/hr at 01/23/20 0853, 1,000 mL at 01/23/20 0853  Medications Prior to Admission  Medication Sig Dispense Refill Last Dose  . aspirin 81 MG tablet Take 81 mg by mouth.    01/22/2020 at Unknown time  . hydrochlorothiazide (HYDRODIURIL) 25 MG tablet TAKE 1 TABLET DAILY. 90 tablet 3 01/23/2020 at Unknown time  . ibuprofen (ADVIL) 600 MG tablet Take 600 mg by mouth every 6 (six) hours as needed.     Marland Kitchen levothyroxine (SYNTHROID) 50 MCG tablet TAKE 1 TABLET DAILY BEFORE BREAKFAST. 90 tablet 3 01/22/2020 at Unknown time  . metoprolol succinate (TOPROL-XL) 25 MG 24 hr tablet TAKE 1 TABLET DAILY . TAKE WITH OR IMMEDIATELY        FOLLOWING A MEAL. 90 tablet 3 01/23/2020 at Unknown time  . omeprazole (PRILOSEC) 20 MG capsule Take 1 capsule (20 mg total) by mouth 2 (two) times daily before a meal. 180 capsule 1 01/23/2020 at Unknown time  . quinapril (ACCUPRIL) 40 MG tablet Take 40 mg by mouth at bedtime.   01/22/2020 at Unknown time  . sertraline (ZOLOFT) 50 MG tablet Take 50 mg by mouth daily.   01/23/2020 at Unknown time  . vitamin B-12 (CYANOCOBALAMIN) 100 MCG tablet Take 200 mcg by mouth daily.   01/23/2020 at Unknown time  . fluticasone (FLONASE) 50 MCG/ACT nasal spray Place 1 spray into both nostrils daily. 48 g 2   . HYDROcodone-homatropine (HYCODAN) 5-1.5 MG/5ML syrup Take 5 mLs by mouth every 8 (eight) hours as needed for  cough. 120 mL 0   . levocetirizine (XYZAL) 5 MG tablet Take 1 tablet (5 mg total) by mouth every evening. 90 tablet 1   . losartan (COZAAR) 100 MG tablet TAKE 1 TABLET(100 MG) BY MOUTH DAILY 90 tablet 3   . MULTIPLE VITAMIN PO Take by mouth.     . OMEGA-3 FATTY ACIDS PO Take 1,040 mg by mouth daily.      . sertraline (ZOLOFT) 100 MG tablet Take 1 tablet (100 mg total) by mouth daily. 90 tablet 3      No Known Allergies   Past Medical History:  Diagnosis Date  . Arthritis   . Depression   . Dysrhythmia    ST  . High blood pressure   . Hypothyroidism   . MRSA infection 2006   on nose  . Uterine cancer ([place]) 2009    Review of systems:  Otherwise negative.    Physical Exam  Gen: Alert, oriented. Appears stated age.  HEENT: [place]/AT. PERRLA. Lungs: CTA, no wheezes. CV: RR nl S1, S2. Abd: soft, benign, no masses. BS+ Ext: No edema. Pulses 2+    Planned procedures: Proceed with colonoscopy with biopsies. The patient understands the nature of the planned procedure, indications, risks, alternatives and potential complications including but not limited to bleeding, infection, perforation, damage to internal organs and possible oversedation/side effects from anesthesia. The patient agrees and gives consent to proceed.  Please refer to procedure notes for findings,  recommendations and patient disposition/instructions.     Acy Orsak K. Alice Reichert, M.D. Gastroenterology 01/23/2020  9:36 AM

## 2020-01-23 NOTE — Anesthesia Preprocedure Evaluation (Signed)
Anesthesia Evaluation  Patient identified by MRN, date of birth, ID band Patient awake    Reviewed: Allergy & Precautions, NPO status , Patient's Chart, lab work & pertinent test results, reviewed documented beta blocker date and time   History of Anesthesia Complications Negative for: history of anesthetic complications  Airway Mallampati: III  TM Distance: >3 FB     Dental  (+) Dental Advidsory Given, Caps, Teeth Intact   Pulmonary neg pulmonary ROS, former smoker,    Pulmonary exam normal        Cardiovascular hypertension, Pt. on medications and Pt. on home beta blockers (-) angina(-) Past MI and (-) Cardiac Stents Normal cardiovascular exam+ dysrhythmias (-) Valvular Problems/Murmurs     Neuro/Psych PSYCHIATRIC DISORDERS Anxiety Depression negative neurological ROS     GI/Hepatic Neg liver ROS, GERD  ,  Endo/Other  neg diabetesHypothyroidism   Renal/GU negative Renal ROS     Musculoskeletal  (+) Arthritis ,   Abdominal   Peds  Hematology   Anesthesia Other Findings Past Medical History: No date: Arthritis No date: Depression No date: Dysrhythmia     Comment:  ST No date: High blood pressure No date: Hypothyroidism 2006: MRSA infection     Comment:  on nose 2009: Uterine cancer ([place])   Reproductive/Obstetrics                             Anesthesia Physical  Anesthesia Plan  ASA: II  Anesthesia Plan: General   Post-op Pain Management:    Induction: Intravenous  PONV Risk Score and Plan: 3 and Propofol infusion and TIVA  Airway Management Planned: Natural Airway and Nasal Cannula  Additional Equipment:   Intra-op Plan:   Post-operative Plan:   Informed Consent: I have reviewed the patients History and Physical, chart, labs and discussed the procedure including the risks, benefits and alternatives for the proposed anesthesia with the patient or authorized  representative who has indicated his/her understanding and acceptance.       Plan Discussed with: CRNA  Anesthesia Plan Comments:         Anesthesia Quick Evaluation

## 2020-01-23 NOTE — Op Note (Addendum)
[facility] Gastroenterology Patient Name: Rebecca Patton Procedure Date: 01/23/2020 9:42 AM MRN: [id number] Account #: [id number] Date of Birth: 27-Feb-1946 Admit Type: Outpatient Age: 74 Room: [facility] ENDO ROOM 4 Gender: Female Note Status: Finalized Procedure:             Colonoscopy Indications:           Diarrhea (presumed secondary to inflammatory bowel                         disease), Functional diarrhea Providers:             Benay Pike. Alice Reichert MD, MD Referring MD:          Janine Ores. Rosanna Randy, MD (Referring MD) Medicines:             Propofol per Anesthesia Complications:         No immediate complications. Procedure:             Pre-Anesthesia Assessment:                        - The risks and benefits of the procedure and the                         sedation options and risks were discussed with the                         patient. All questions were answered and informed                         consent was obtained.                        - Patient identification and proposed procedure were                         verified prior to the procedure by the nurse. The                         procedure was verified in the procedure room.                        - ASA Grade Assessment: III - A patient with severe                         systemic disease.                        - After reviewing the risks and benefits, the patient                         was deemed in satisfactory condition to undergo the                         procedure.                        After obtaining informed consent, the colonoscope was                         passed under direct vision. Throughout the procedure,  the patient's blood pressure, pulse, and oxygen                         saturations were monitored continuously. The                         Colonoscope was introduced through the anus and                         advanced to the the cecum, identified by  appendiceal                         orifice and ileocecal valve. The colonoscopy was                         somewhat difficult due to restricted mobility of the                         colon. Successful completion of the procedure was                         aided by applying abdominal pressure. The patient                         tolerated the procedure well. The quality of the bowel                         preparation was good. The ileocecal valve, appendiceal                         orifice, and rectum were photographed. Findings:      The perianal and digital rectal examinations were normal. Pertinent       negatives include normal sphincter tone and no palpable rectal lesions.      Multiple small and large-mouthed diverticula were found in the entire       colon. There was no evidence of diverticular bleeding.      Three sessile polyps were found in the rectum and sigmoid colon. The       polyps were 3 to 5 mm in size. These polyps were removed with a jumbo       cold forceps. Resection and retrieval were complete.      Non-bleeding internal hemorrhoids were found during retroflexion. The       hemorrhoids were Grade I (internal hemorrhoids that do not prolapse).      The exam was otherwise without abnormality. Impression:            - Moderate diverticulosis in the entire examined                         colon. There was no evidence of diverticular bleeding.                        - Three 3 to 5 mm polyps in the rectum and in the                         sigmoid colon, removed with a jumbo cold forceps.  Resected and retrieved.                        - Non-bleeding internal hemorrhoids.                        - The examination was otherwise normal. Recommendation:        - Patient has a contact number available for                         emergencies. The signs and symptoms of potential                         delayed complications were discussed with the  patient.                         Return to normal activities tomorrow. Written                         discharge instructions were provided to the patient.                        - Resume previous diet.                        - Continue present medications.                        - Await pathology results.                        - Return to physician assistant in 6 weeks.                        - Please follow up with Effie Berkshire, PA-C in 6                         weeks Procedure Code(s):     --- Professional ---                        [id number], Colonoscopy, flexible; with biopsy, single or                         multiple Diagnosis Code(s):     --- Professional ---                        K57.30, Diverticulosis of large intestine without                         perforation or abscess without bleeding                        K59.1, Functional diarrhea                        R19.7, Diarrhea, unspecified                        K63.5, Polyp of colon  K62.1, Rectal polyp                        K64.0, First degree hemorrhoids CPT copyright 2019 American Medical Association. All rights reserved. The codes documented in this report are preliminary and upon coder review may  be revised to meet current compliance requirements. Efrain Sella MD, MD 01/23/2020 10:03:11 AM This report has been signed electronically. Number of Addenda: 0 Note Initiated On: 01/23/2020 9:42 AM Scope Withdrawal Time: 0 hours 4 minutes 12 seconds  Total Procedure Duration: 0 hours 11 minutes 54 seconds  Estimated Blood Loss:  Estimated blood loss: none.      [facility]

## 2020-01-23 NOTE — Transfer of Care (Signed)
Immediate Anesthesia Transfer of Care Note  Patient: Rebecca Patton  Procedure(s) Performed: COLONOSCOPY WITH PROPOFOL (N/A )  Patient Location: PACU  Anesthesia Type:General  Level of Consciousness: awake and sedated  Airway & Oxygen Therapy: Patient Spontanous Breathing and Patient connected to nasal cannula oxygen  Post-op Assessment: Report given to RN and Post -op Vital signs reviewed and stable  Post vital signs: Reviewed and stable  Last Vitals:  Vitals Value Taken Time  BP 105/38 01/23/20 1004  Temp    Pulse 53 01/23/20 1004  Resp 16 01/23/20 1004  SpO2 98 % 01/23/20 1004  Vitals shown include unvalidated device data.  Last Pain:  Vitals:   01/23/20 0834  TempSrc: Tympanic  PainSc: 0-No pain         Complications: No apparent anesthesia complications

## 2020-01-23 NOTE — Anesthesia Procedure Notes (Signed)
Performed by: Cook-Martin, Adrain Butrick Pre-anesthesia Checklist: Patient identified, Emergency Drugs available, Suction available and Patient being monitored Patient Re-evaluated:Patient Re-evaluated prior to induction Oxygen Delivery Method: Nasal cannula Preoxygenation: Pre-oxygenation with 100% oxygen Induction Type: IV induction Placement Confirmation: CO2 detector and positive ETCO2       

## 2020-01-24 ENCOUNTER — Encounter: Payer: Self-pay | Admitting: *Deleted

## 2020-01-24 LAB — SURGICAL PATHOLOGY

## 2020-01-24 NOTE — Anesthesia Postprocedure Evaluation (Signed)
Anesthesia Post Note  Patient: Rebecca Patton  Procedure(s) Performed: COLONOSCOPY WITH PROPOFOL (N/A )  Patient location during evaluation: Endoscopy Anesthesia Type: General Level of consciousness: awake and alert Pain management: pain level controlled Vital Signs Assessment: post-procedure vital signs reviewed and stable Respiratory status: spontaneous breathing, nonlabored ventilation, respiratory function stable and patient connected to nasal cannula oxygen Cardiovascular status: blood pressure returned to baseline and stable Postop Assessment: no apparent nausea or vomiting Anesthetic complications: no     Last Vitals:  Vitals:   01/23/20 1013 01/23/20 1023  BP: (!) 99/39   Pulse: (!) 47 (!) 49  Resp: 16 16  Temp:    SpO2: 99% 97%    Last Pain:  Vitals:   01/24/20 0745  TempSrc:   PainSc: 0-No pain                 Martha Clan

## 2020-01-27 ENCOUNTER — Encounter: Payer: Self-pay | Admitting: *Deleted

## 2020-02-04 ENCOUNTER — Other Ambulatory Visit: Payer: Self-pay | Admitting: Family Medicine

## 2020-02-04 DIAGNOSIS — K219 Gastro-esophageal reflux disease without esophagitis: Secondary | ICD-10-CM

## 2020-02-04 DIAGNOSIS — R059 Cough, unspecified: Secondary | ICD-10-CM

## 2020-03-30 DIAGNOSIS — R69 Illness, unspecified: Secondary | ICD-10-CM | POA: Diagnosis not present

## 2020-04-30 ENCOUNTER — Telehealth: Payer: Self-pay | Admitting: Family Medicine

## 2020-04-30 DIAGNOSIS — R0982 Postnasal drip: Secondary | ICD-10-CM

## 2020-04-30 MED ORDER — FLUTICASONE PROPIONATE 50 MCG/ACT NA SUSP: 1.0000 | Freq: Every day | NASAL | 2 refills | Status: DC

## 2020-04-30 NOTE — Telephone Encounter (Signed)
Medication Refill - Medication: flonase nasal spray  Has the patient contacted their pharmacy? No. (Agent: If no, request that the patient contact the pharmacy for the refill.) (Agent: If yes, when and what did the pharmacy advise?)  Preferred Pharmacy (with phone number or street name): [place], [place] - [address]  Agent: Please be advised that RX refills may take up to 3 business days. We ask that you follow-up with your pharmacy.

## 2020-05-25 DIAGNOSIS — R69 Illness, unspecified: Secondary | ICD-10-CM | POA: Diagnosis not present

## 2020-06-04 ENCOUNTER — Other Ambulatory Visit: Payer: Self-pay | Admitting: Physician Assistant

## 2020-06-04 DIAGNOSIS — R059 Cough, unspecified: Secondary | ICD-10-CM

## 2020-06-04 DIAGNOSIS — K219 Gastro-esophageal reflux disease without esophagitis: Secondary | ICD-10-CM

## 2020-06-24 DIAGNOSIS — L578 Other skin changes due to chronic exposure to nonionizing radiation: Secondary | ICD-10-CM | POA: Diagnosis not present

## 2020-06-24 DIAGNOSIS — L570 Actinic keratosis: Secondary | ICD-10-CM | POA: Diagnosis not present

## 2020-06-24 DIAGNOSIS — Z872 Personal history of diseases of the skin and subcutaneous tissue: Secondary | ICD-10-CM | POA: Diagnosis not present

## 2020-06-24 DIAGNOSIS — Z859 Personal history of malignant neoplasm, unspecified: Secondary | ICD-10-CM | POA: Diagnosis not present

## 2020-06-24 DIAGNOSIS — L821 Other seborrheic keratosis: Secondary | ICD-10-CM | POA: Diagnosis not present
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
## 2020-07-06 ENCOUNTER — Other Ambulatory Visit: Payer: Self-pay

## 2020-07-06 ENCOUNTER — Ambulatory Visit (INDEPENDENT_AMBULATORY_CARE_PROVIDER_SITE_OTHER): Payer: Medicare HMO

## 2020-07-06 DIAGNOSIS — Z0000 Encounter for general adult medical examination without abnormal findings: Secondary | ICD-10-CM

## 2020-07-06 NOTE — Patient Instructions (Signed)
Ms. Hieronymus , Thank you for taking time to come for your Medicare Wellness Visit. I appreciate your ongoing commitment to your health goals. Please review the following plan we discussed and let me know if I can assist you in the future.   Screening recommendations/referrals: Colonoscopy: Up to date, due 01/2030 Mammogram: Up to date, due 07/09/20. Pt to call and schedule mammogram this week.  Bone Density: Up to date, due 03/2024 Recommended yearly ophthalmology/optometry visit for glaucoma screening and checkup Recommended yearly dental visit for hygiene and checkup  Vaccinations: Influenza vaccine: Currently due. Pt to receive at at pharmacy this fall. Pneumococcal vaccine: Completed series Tdap vaccine: Currently due, declined at this time.  Shingles vaccine: Shingrix discussed. Please contact your pharmacy for coverage information.     Advanced directives: Currently on file.   Conditions/risks identified: Recommend exercising 3 days a week for at least 30 minutes.   Next appointment: 07/21/20 @ 2:20 PM with Dr Rosanna Randy   Preventive Care 65 Years and Older, Female Preventive care refers to lifestyle choices and visits with your health care provider that can promote health and wellness. What does preventive care include?  A yearly physical exam. This is also called an annual well check.  Dental exams once or twice a year.  Routine eye exams. Ask your health care provider how often you should have your eyes checked.  Personal lifestyle choices, including:  Daily care of your teeth and gums.  Regular physical activity.  Eating a healthy diet.  Avoiding tobacco and drug use.  Limiting alcohol use.  Practicing safe sex.  Taking low-dose aspirin every day.  Taking vitamin and mineral supplements as recommended by your health care provider. What happens during an annual well check? The services and screenings done by your health care provider during your annual well check  will depend on your age, overall health, lifestyle risk factors, and family history of disease. Counseling  Your health care provider may ask you questions about your:  Alcohol use.  Tobacco use.  Drug use.  Emotional well-being.  Home and relationship well-being.  Sexual activity.  Eating habits.  History of falls.  Memory and ability to understand (cognition).  Work and work Statistician.  Reproductive health. Screening  You may have the following tests or measurements:  Height, weight, and BMI.  Blood pressure.  Lipid and cholesterol levels. These may be checked every 5 years, or more frequently if you are over 45 years old.  Skin check.  Lung cancer screening. You may have this screening every year starting at age 1 if you have a 30-pack-year history of smoking and currently smoke or have quit within the past 15 years.  Fecal occult blood test (FOBT) of the stool. You may have this test every year starting at age 48.  Flexible sigmoidoscopy or colonoscopy. You may have a sigmoidoscopy every 5 years or a colonoscopy every 10 years starting at age 64.  Hepatitis C blood test.  Hepatitis B blood test.  Sexually transmitted disease (STD) testing.  Diabetes screening. This is done by checking your blood sugar (glucose) after you have not eaten for a while (fasting). You may have this done every 1-3 years.  Bone density scan. This is done to screen for osteoporosis. You may have this done starting at age 44.  Mammogram. This may be done every 1-2 years. Talk to your health care provider about how often you should have regular mammograms. Talk with your health care provider about your test  results, treatment options, and if necessary, the need for more tests. Vaccines  Your health care provider may recommend certain vaccines, such as:  Influenza vaccine. This is recommended every year.  Tetanus, diphtheria, and acellular pertussis (Tdap, Td) vaccine. You may  need a Td booster every 10 years.  Zoster vaccine. You may need this after age 55.  Pneumococcal 13-valent conjugate (PCV13) vaccine. One dose is recommended after age 89.  Pneumococcal polysaccharide (PPSV23) vaccine. One dose is recommended after age 44. Talk to your health care provider about which screenings and vaccines you need and how often you need them. This information is not intended to replace advice given to you by your health care provider. Make sure you discuss any questions you have with your health care provider. Document Released: 09/25/2015 Document Revised: 05/18/2016 Document Reviewed: 06/30/2015 Elsevier Interactive Patient Education  2017 [place] Prevention in the Home Falls can cause injuries. They can happen to people of all ages. There are many things you can do to make your home safe and to help prevent falls. What can I do on the outside of my home?  Regularly fix the edges of walkways and driveways and fix any cracks.  Remove anything that might make you trip as you walk through a door, such as a raised step or threshold.  Trim any bushes or trees on the path to your home.  Use bright outdoor lighting.  Clear any walking paths of anything that might make someone trip, such as rocks or tools.  Regularly check to see if handrails are loose or broken. Make sure that both sides of any steps have handrails.  Any raised decks and porches should have guardrails on the edges.  Have any leaves, snow, or ice cleared regularly.  Use sand or salt on walking paths during winter.  Clean up any spills in your garage right away. This includes oil or grease spills. What can I do in the bathroom?  Use night lights.  Install grab bars by the toilet and in the tub and shower. Do not use towel bars as grab bars.  Use non-skid mats or decals in the tub or shower.  If you need to sit down in the shower, use a plastic, non-slip stool.  Keep the floor  dry. Clean up any water that spills on the floor as soon as it happens.  Remove soap buildup in the tub or shower regularly.  Attach bath mats securely with double-sided non-slip rug tape.  Do not have throw rugs and other things on the floor that can make you trip. What can I do in the bedroom?  Use night lights.  Make sure that you have a light by your bed that is easy to reach.  Do not use any sheets or blankets that are too big for your bed. They should not hang down onto the floor.  Have a firm chair that has side arms. You can use this for support while you get dressed.  Do not have throw rugs and other things on the floor that can make you trip. What can I do in the kitchen?  Clean up any spills right away.  Avoid walking on wet floors.  Keep items that you use a lot in easy-to-reach places.  If you need to reach something above you, use a strong step stool that has a grab bar.  Keep electrical cords out of the way.  Do not use floor polish or wax that makes  floors slippery. If you must use wax, use non-skid floor wax.  Do not have throw rugs and other things on the floor that can make you trip. What can I do with my stairs?  Do not leave any items on the stairs.  Make sure that there are handrails on both sides of the stairs and use them. Fix handrails that are broken or loose. Make sure that handrails are as long as the stairways.  Check any carpeting to make sure that it is firmly attached to the stairs. Fix any carpet that is loose or worn.  Avoid having throw rugs at the top or bottom of the stairs. If you do have throw rugs, attach them to the floor with carpet tape.  Make sure that you have a light switch at the top of the stairs and the bottom of the stairs. If you do not have them, ask someone to add them for you. What else can I do to help prevent falls?  Wear shoes that:  Do not have high heels.  Have rubber bottoms.  Are comfortable and fit you  well.  Are closed at the toe. Do not wear sandals.  If you use a stepladder:  Make sure that it is fully opened. Do not climb a closed stepladder.  Make sure that both sides of the stepladder are locked into place.  Ask someone to hold it for you, if possible.  Clearly mark and make sure that you can see:  Any grab bars or handrails.  First and last steps.  Where the edge of each step is.  Use tools that help you move around (mobility aids) if they are needed. These include:  Canes.  Walkers.  Scooters.  Crutches.  Turn on the lights when you go into a dark area. Replace any light bulbs as soon as they burn out.  Set up your furniture so you have a clear path. Avoid moving your furniture around.  If any of your floors are uneven, fix them.  If there are any pets around you, be aware of where they are.  Review your medicines with your doctor. Some medicines can make you feel dizzy. This can increase your chance of falling. Ask your doctor what other things that you can do to help prevent falls. This information is not intended to replace advice given to you by your health care provider. Make sure you discuss any questions you have with your health care provider. Document Released: 06/25/2009 Document Revised: 02/04/2016 Document Reviewed: 10/03/2014 Elsevier Interactive Patient Education  2017 [employer].

## 2020-07-09 DIAGNOSIS — H2510 Age-related nuclear cataract, unspecified eye: Secondary | ICD-10-CM | POA: Diagnosis not present

## 2020-07-09 DIAGNOSIS — H40013 Open angle with borderline findings, low risk, bilateral: Secondary | ICD-10-CM | POA: Diagnosis not present

## 2020-07-21 ENCOUNTER — Encounter: Payer: Self-pay | Admitting: Family Medicine

## 2020-07-21 ENCOUNTER — Ambulatory Visit (INDEPENDENT_AMBULATORY_CARE_PROVIDER_SITE_OTHER): Payer: Medicare HMO | Admitting: Family Medicine

## 2020-07-21 ENCOUNTER — Other Ambulatory Visit: Payer: Self-pay

## 2020-07-21 DIAGNOSIS — Z23 Encounter for immunization: Secondary | ICD-10-CM | POA: Diagnosis not present

## 2020-07-21 DIAGNOSIS — G629 Polyneuropathy, unspecified: Secondary | ICD-10-CM

## 2020-07-21 DIAGNOSIS — R059 Cough, unspecified: Secondary | ICD-10-CM | POA: Diagnosis not present

## 2020-07-21 DIAGNOSIS — K219 Gastro-esophageal reflux disease without esophagitis: Secondary | ICD-10-CM

## 2020-07-21 MED ORDER — OMEPRAZOLE 20 MG PO CPDR: DELAYED_RELEASE_CAPSULE | ORAL | 1 refills | Status: DC

## 2020-07-21 MED ORDER — GABAPENTIN 100 MG PO CAPS: 100.0000 mg | ORAL_CAPSULE | Freq: Two times a day (BID) | ORAL | 1 refills | Status: DC

## 2020-07-21 NOTE — Progress Notes (Signed)
I,[name],acting as a scribe for Rebecca Durie, MD.,have documented all relevant documentation on the behalf of Rebecca Durie, MD,as directed by  Rebecca Durie, MD while in the presence of Rebecca Durie, MD.   Established patient visit   Patient: Rebecca Patton   DOB: 1946-08-25   74 y.o. Female  MRN: [id number] Visit Date: 07/21/2020  Today's healthcare provider: [name], MD   Chief Complaint  Patient presents with  . Abdominal Pain  . Follow-up   Subjective    HPI  Her abdominal pain is mainly certain movements now.  It is periumbilical. The complaint she really has today is 1 of chronic neuropathic pain that is burning pain in her feet and legs. Periumbilical abdominal pain From 12/30/2019-seen by Grace Bushy. Patient has h/o supra-umbilical hernia repair. No hernia felt on exam today. Suspect irritation of the mesh and abdominal wall fascia secondary to coughing. Advised to monitor and call if pain persists, worsens, or is associated with lack of BM or ability to pass gas. Also advised to call if pain is associated with eating as this may indicate more gallstones issues. Currently it has been localized to over the area of hernia repair, not in the RUQ. She voices understanding.   Patient states her abdominal pain has worsened since her visit in April. Patient states she does not know what is causing the pain. Pain is located in mid upper abdomin where she had supra-umbilical hernia repair 7/51025. Patient is also having low back pain that does not radiate.        Medications: Outpatient Medications Prior to Visit  Medication Sig  . aspirin 81 MG tablet Take 81 mg by mouth.   . fluticasone (FLONASE) 50 MCG/ACT nasal spray Place 1 spray into both nostrils daily.  . hydrochlorothiazide (HYDRODIURIL) 25 MG tablet TAKE 1 TABLET DAILY.  Marland Kitchen ibuprofen (ADVIL) 600 MG tablet Take 600 mg by mouth every 6 (six) hours as needed.  Marland Kitchen levothyroxine  (SYNTHROID) 50 MCG tablet TAKE 1 TABLET DAILY BEFORE BREAKFAST.  Marland Kitchen losartan (COZAAR) 100 MG tablet TAKE 1 TABLET(100 MG) BY MOUTH DAILY  . metoprolol succinate (TOPROL-XL) 25 MG 24 hr tablet TAKE 1 TABLET DAILY . TAKE WITH OR IMMEDIATELY        FOLLOWING A MEAL.  . MULTIPLE VITAMIN PO Take by mouth daily.   . sertraline (ZOLOFT) 100 MG tablet Take 1 tablet (100 mg total) by mouth daily.  . [DISCONTINUED] omeprazole (PRILOSEC) 20 MG capsule TAKE 1 CAPSULE TWICE DAILY BEFORE MEALS  . HYDROcodone-homatropine (HYCODAN) 5-1.5 MG/5ML syrup Take 5 mLs by mouth every 8 (eight) hours as needed for cough. (Patient not taking: Reported on 07/06/2020)  . levocetirizine (XYZAL) 5 MG tablet Take 1 tablet (5 mg total) by mouth every evening. (Patient not taking: Reported on 07/21/2020)  . OMEGA-3 FATTY ACIDS PO Take 1,040 mg by mouth daily.  (Patient not taking: Reported on 07/06/2020)  . quinapril (ACCUPRIL) 40 MG tablet Take 40 mg by mouth at bedtime. (Patient not taking: Reported on 07/06/2020)  . sertraline (ZOLOFT) 50 MG tablet Take 50 mg by mouth daily. (Patient not taking: Reported on 07/06/2020)  . vitamin B-12 (CYANOCOBALAMIN) 100 MCG tablet Take 200 mcg by mouth daily. (Patient not taking: Reported on 07/21/2020)   No facility-administered medications prior to visit.    Review of Systems  Constitutional: Negative for appetite change, chills, fatigue and fever.  Respiratory: Negative for chest tightness and shortness of breath.  Cardiovascular: Negative for chest pain and palpitations.  Gastrointestinal: Negative for abdominal pain, nausea and vomiting.  Neurological: Negative for dizziness and weakness.       Objective    BP (!) 130/58 (BP Location: Left Arm, Patient Position: Sitting, Cuff Size: Large)   Pulse (!) 56   Temp 98.3 F (36.8 C) (Oral)   Resp 16   Ht 5\' 6"  (1.676 m)   Wt 207 lb (93.9 kg)   SpO2 98%   BMI 33.41 kg/m  BP Readings from Last 3 Encounters:  07/21/20 (!) 130/58   01/23/20 (!) 99/39  12/30/19 117/66   Wt Readings from Last 3 Encounters:  07/21/20 207 lb (93.9 kg)  01/23/20 195 lb (88.5 kg)  12/30/19 197 lb 12.8 oz (89.7 kg)      Physical Exam Vitals reviewed.  HENT:     Head: Normocephalic and atraumatic.     Right Ear: External ear normal.     Left Ear: External ear normal.  Eyes:     General: No scleral icterus.    Conjunctiva/sclera: Conjunctivae normal.  Cardiovascular:     Rate and Rhythm: Normal rate and regular rhythm.     Heart sounds: Normal heart sounds.  Pulmonary:     Effort: Pulmonary effort is normal.     Breath sounds: Normal breath sounds.  Abdominal:     General: There is no distension.     Palpations: Abdomen is soft. There is no mass.     Tenderness: There is no guarding or rebound.     Hernia: No hernia is present.  Lymphadenopathy:     Cervical: No cervical adenopathy.  Neurological:     Mental Status: She is alert.       No results found for any visits on 07/21/20.  Assessment & Plan     1. Need for influenza vaccination  - Flu Vaccine QUAD High Dose(Fluad)  2. Neuropathy Patient wishes to follow-up virtually in about 3 months. - gabapentin (NEURONTIN) 100 MG capsule; Take 1 capsule (100 mg total) by mouth 2 (two) times daily.  Dispense: 180 capsule; Refill: 1  3. Gastroesophageal reflux disease without esophagitis  - omeprazole (PRILOSEC) 20 MG capsule; TAKE 1 CAPSULE TWICE DAILY BEFORE MEALS  Dispense: 180 capsule; Refill: 1  4. Cough Helped with treatment of reflux. - omeprazole (PRILOSEC) 20 MG capsule; TAKE 1 CAPSULE TWICE DAILY BEFORE MEALS  Dispense: 180 capsule; Refill: 1   Return in about 2 months (around 09/20/2020).         Arshan Jabs Cranford Mon, MD  [facility] [phone] (phone) [phone] (fax)  [place]

## 2020-07-30 ENCOUNTER — Other Ambulatory Visit: Payer: Self-pay | Admitting: Family Medicine

## 2020-07-30 DIAGNOSIS — Z1231 Encounter for screening mammogram for malignant neoplasm of breast: Secondary | ICD-10-CM

## 2020-08-13 DIAGNOSIS — Z859 Personal history of malignant neoplasm, unspecified: Secondary | ICD-10-CM | POA: Diagnosis not present

## 2020-08-17 DIAGNOSIS — S93602A Unspecified sprain of left foot, initial encounter: Secondary | ICD-10-CM | POA: Diagnosis not present

## 2020-08-17 DIAGNOSIS — M79672 Pain in left foot: Secondary | ICD-10-CM | POA: Diagnosis not present

## 2020-08-27 ENCOUNTER — Ambulatory Visit
Admission: RE | Admit: 2020-08-27 | Discharge: 2020-08-27 | Disposition: A | Discharge status: Home / Self Care | Payer: Medicare HMO | Source: Ambulatory Visit | Attending: Family Medicine | Admitting: Family Medicine

## 2020-08-27 ENCOUNTER — Other Ambulatory Visit: Payer: Self-pay

## 2020-08-27 DIAGNOSIS — Z1231 Encounter for screening mammogram for malignant neoplasm of breast: Secondary | ICD-10-CM | POA: Insufficient documentation

## 2020-09-12 DIAGNOSIS — U071 COVID-19: Secondary | ICD-10-CM

## 2020-09-12 HISTORY — DX: COVID-19: U07.1

## 2020-09-21 ENCOUNTER — Telehealth (INDEPENDENT_AMBULATORY_CARE_PROVIDER_SITE_OTHER): Payer: Medicare HMO | Admitting: Family Medicine

## 2020-09-21 DIAGNOSIS — E7800 Pure hypercholesterolemia, unspecified: Secondary | ICD-10-CM | POA: Diagnosis not present

## 2020-09-21 DIAGNOSIS — E039 Hypothyroidism, unspecified: Secondary | ICD-10-CM | POA: Diagnosis not present

## 2020-09-21 DIAGNOSIS — U071 COVID-19: Secondary | ICD-10-CM

## 2020-09-21 DIAGNOSIS — I10 Essential (primary) hypertension: Secondary | ICD-10-CM | POA: Diagnosis not present

## 2020-09-21 NOTE — Progress Notes (Signed)
MyChart Video Visit    Virtual Visit via Video Note   This visit type was conducted due to national recommendations for restrictions regarding the COVID-19 Pandemic (e.g. social distancing) in an effort to limit this patient's exposure and mitigate transmission in our community. This patient is at least at moderate risk for complications without adequate follow up. This format is felt to be most appropriate for this patient at this time. Physical exam was limited by quality of the video and audio technology used for the visit.   Patient location: Home Provider location: office We were unable to connect on the computer so this was a phone visit. I discussed the limitations of evaluation and management by telemedicine and the availability of in person appointments. The patient expressed understanding and agreed to proceed.  Patient: Rebecca Patton   DOB: 08-31-1946   75 y.o. Female  MRN: [id number] Visit Date: 09/21/2020  Today's healthcare provider: [name], MD   No chief complaint on file.  Subjective    HPI  Covid last week.Negative tests with home test but she had neighbors that were positive and she had the same symptoms.  Other family members were also positive.  She has been quarantining and is feeling back to normal now. Patient has also been widowed since last year and this was the first holiday without her husband.  She is actually doing fairly well. Hypertension, follow-up  BP Readings from Last 3 Encounters:  07/21/20 (!) 130/58  01/23/20 (!) 99/39  12/30/19 117/66   Wt Readings from Last 3 Encounters:  07/21/20 207 lb (93.9 kg)  01/23/20 195 lb (88.5 kg)  12/30/19 197 lb 12.8 oz (89.7 kg)     She was last seen for hypertension 8 months ago.  BP at that visit was good. Management since that visit includes starting exercise..  She reports excellent compliance with treatment. She is not having side effects.  She is following a Low Sodium diet. She is  exercising. She does not smoke.  Use of agents associated with hypertension: none.   Outside blood pressures are not being checked. Symptoms: No chest pain No chest pressure  No palpitations No syncope  No dyspnea No orthopnea  No paroxysmal nocturnal dyspnea No lower extremity edema   Pertinent labs: Lab Results  Component Value Date   CHOL 219 (H) 11/08/2017   HDL 56 11/08/2017   LDLCALC 126 (H) 11/08/2017   TRIG 183 (H) 11/08/2017   CHOLHDL 3.9 11/08/2017   Lab Results  Component Value Date   NA 141 10/17/2019   K 3.9 10/17/2019   CREATININE 0.92 10/17/2019   GFRNONAA 63 10/17/2019   GFRAA 72 10/17/2019   GLUCOSE 88 10/17/2019     The 10-year ASCVD risk score Mikey Bussing DC Jr., et al., 2013) is: 16.2%   --------------------------------------------------------------------------------------------------- Hypothyroid, follow-up  Lab Results  Component Value Date   TSH 3.880 03/28/2019   TSH 3.220 09/27/2018   TSH 4.180 11/08/2017   Wt Readings from Last 3 Encounters:  07/21/20 207 lb (93.9 kg)  01/23/20 195 lb (88.5 kg)  12/30/19 197 lb 12.8 oz (89.7 kg)    She was last seen for hypothyroid 11 months ago.  Management since that visit includes . She reports good compliance with treatment. She is not having side effects.   Symptoms: No change in energy level No constipation  No diarrhea No heat / cold intolerance  No nervousness No palpitations  No weight changes    -----------------------------------------------------------------------------------------  Follow up for Neuropathy  The patient was last seen for this 2 months ago. Changes made at last visit include starting gabapentin 100mg .   She reports poor compliance with treatment. Pt states she was having too many side effects to gabapentin so she stopped taking it.  She feels that condition is Improved. She is having side effects.    -----------------------------------------------------------------------------------------    Patient Active Problem List   Diagnosis Date Noted  . Incisional hernia, without obstruction or gangrene   . Supraumbilical hernia 67/89/3810  . Anxiety 03/20/2015  . Arthritis 03/20/2015  . 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 Clinical depression 03/20/2015  . Malignant neoplasm of corpus uteri, except isthmus ([place]) 03/20/2015  . Essential (primary) hypertension 03/20/2015  . HLD (hyperlipidemia) 03/20/2015  . Cannot sleep 03/20/2015  . Osteopenia 03/20/2015  . Adult hypothyroidism 03/20/2015   Past Medical History:  Diagnosis Date  . Arthritis   . Depression   . Dysrhythmia    ST  . High blood pressure   . Hypothyroidism   . MRSA infection 2006   on nose  . Uterine cancer ([place]) 2009   Social History   Tobacco Use  . Smoking status: Former Smoker    Quit date: 09/11/1997    Years since quitting: 23.0  . Smokeless tobacco: Never Used  Vaping Use  . Vaping Use: Never used  Substance Use Topics  . Alcohol use: No    Alcohol/week: 0.0 standard drinks  . Drug use: No   No Known Allergies  Medications: Outpatient Medications Prior to Visit  Medication Sig  . aspirin 81 MG tablet Take 81 mg by mouth.   . fluticasone (FLONASE) 50 MCG/ACT nasal spray Place 1 spray into both nostrils daily.  . hydrochlorothiazide (HYDRODIURIL) 25 MG tablet TAKE 1 TABLET DAILY.  Marland Kitchen ibuprofen (ADVIL) 600 MG tablet Take 600 mg by mouth every 6 (six) hours as needed.  Marland Kitchen levothyroxine (SYNTHROID) 50 MCG tablet TAKE 1 TABLET DAILY BEFORE BREAKFAST.  Marland Kitchen losartan (COZAAR) 100 MG tablet TAKE 1 TABLET(100 MG) BY MOUTH DAILY  . metoprolol succinate (TOPROL-XL) 25 MG 24 hr tablet TAKE 1 TABLET DAILY . TAKE WITH OR IMMEDIATELY        FOLLOWING A MEAL.  . MULTIPLE VITAMIN PO Take by mouth daily.   Marland Kitchen omeprazole (PRILOSEC) 20 MG capsule TAKE 1 CAPSULE TWICE DAILY BEFORE MEALS  . sertraline (ZOLOFT) 100  MG tablet Take 1 tablet (100 mg total) by mouth daily.  . vitamin B-12 (CYANOCOBALAMIN) 100 MCG tablet Take 200 mcg by mouth daily.  Marland Kitchen gabapentin (NEURONTIN) 100 MG capsule Take 1 capsule (100 mg total) by mouth 2 (two) times daily. (Patient not taking: Reported on 09/21/2020)  . HYDROcodone-homatropine (HYCODAN) 5-1.5 MG/5ML syrup Take 5 mLs by mouth every 8 (eight) hours as needed for cough.  . levocetirizine (XYZAL) 5 MG tablet Take 1 tablet (5 mg total) by mouth every evening.  Marland Kitchen OMEGA-3 FATTY ACIDS PO Take 1,040 mg by mouth daily.  . quinapril (ACCUPRIL) 40 MG tablet Take 40 mg by mouth at bedtime.  . sertraline (ZOLOFT) 50 MG tablet Take 50 mg by mouth daily.   No facility-administered medications prior to visit.    Review of Systems  Constitutional: Negative.   Respiratory: Negative.   Cardiovascular: Negative.   Gastrointestinal: Negative.   Endocrine: Negative.   Neurological: Negative for dizziness, light-headedness and headaches.      Objective    There were no vitals taken for this visit.  Physical Exam  Patient is alert and oriented.  She is answers questions easily.  No breathing difficulties at all   Assessment & Plan     1. Essential (primary) hypertension Good control this is probably improving with regular exercise which patient is getting. Her foot pain and back pain are improved with regular walking  2. Adult hypothyroidism   3. Pure hypercholesterolemia Schedule physical for spring or summer of this year.  4. COVID-19 Recent presumptive COVID-19 infection.  She is fully vaccinated and is overall symptoms now.   No follow-ups on file.     I discussed the assessment and treatment plan with the patient. The patient was provided an opportunity to ask questions and all were answered. The patient agreed with the plan and demonstrated an understanding of the instructions.   The patient was advised to call back or seek an in-person evaluation if the  symptoms worsen or if the condition fails to improve as anticipated.  I provided 14 minutes of non-face-to-face time during this encounter.  I, [name], MD, have reviewed all documentation for this visit. The documentation on 09/21/20 for the exam, diagnosis, procedures, and orders are all accurate and complete.   Talmadge Ganas Cranford Mon, MD [facility] [phone] (phone) [phone] (fax)  [place]

## 2020-10-01 ENCOUNTER — Ambulatory Visit (INDEPENDENT_AMBULATORY_CARE_PROVIDER_SITE_OTHER): Payer: Medicare HMO | Admitting: Family Medicine

## 2020-10-01 ENCOUNTER — Encounter: Payer: Self-pay | Admitting: Family Medicine

## 2020-10-01 VITALS — Temp 98.0°F | Wt 200.0 lb

## 2020-10-01 DIAGNOSIS — J069 Acute upper respiratory infection, unspecified: Secondary | ICD-10-CM | POA: Diagnosis not present

## 2020-10-01 DIAGNOSIS — R059 Cough, unspecified: Secondary | ICD-10-CM | POA: Diagnosis not present

## 2020-10-01 DIAGNOSIS — U071 COVID-19: Secondary | ICD-10-CM

## 2020-10-01 NOTE — Progress Notes (Signed)
Virtual telephone visit    Virtual Visit via Telephone Note   This visit type was conducted due to national recommendations for restrictions regarding the COVID-19 Pandemic (e.g. social distancing) in an effort to limit this patient's exposure and mitigate transmission in our community. Due to her co-morbid illnesses, this patient is at least at moderate risk for complications without adequate follow up. This format is felt to be most appropriate for this patient at this time. The patient did not have access to video technology or had technical difficulties with video requiring transitioning to audio format only (telephone). Physical exam was limited to content and character of the telephone converstion.    Patient location: home  Provider location: home  I discussed the limitations of evaluation and management by telemedicine and the availability of in person appointments. The patient expressed understanding and agreed to proceed.   Visit Date: 10/01/2020  Today's healthcare provider: [name], MD   Chief Complaint  Patient presents with  . URI    Patient reports close contact with daughter who is positive for COVID. Patient dose have flu vaccine and two doses of COVID. Patient reports taking Benadryl for cough, reports mild symptom control.   Subjective    HPI  Direct exposure to daughter 5 days ago with covid. Pt developed symptoms yesterday. HPI    URI    URI symptoms: congestion, cough, headache, shortness of breath, sore throat and wheezing   Onset: yesterday   Progression since onset: gradually worsening since onset   Fever: temperature has been with in normal range   Hydration: is drinking plenty of fluids   PMH includes: pneumonia   Smoker: not a smoker   Comments: Patient reports close contact with daughter who is positive for COVID. Patient dose have flu vaccine and two doses of COVID. Patient reports taking Benadryl for cough, reports mild symptom  control.       Last edited by Dorian Pod, [place] on 10/01/2020 11:14 AM. (History)      Patient Active Problem List   Diagnosis Date Noted  . Incisional hernia, without obstruction or gangrene   . Supraumbilical hernia [id number]  . Anxiety 03/20/2015  . Arthritis 03/20/2015  . 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 Clinical depression 03/20/2015  . Malignant neoplasm of corpus uteri, except isthmus ([place]) 03/20/2015  . Essential (primary) hypertension 03/20/2015  . HLD (hyperlipidemia) 03/20/2015  . Cannot sleep 03/20/2015  . Osteopenia 03/20/2015  . Adult hypothyroidism 03/20/2015   Social History   Tobacco Use  . Smoking status: Former Smoker    Quit date: 09/11/1997    Years since quitting: 23.0  . Smokeless tobacco: Never Used  Vaping Use  . Vaping Use: Never used  Substance Use Topics  . Alcohol use: No    Alcohol/week: 0.0 standard drinks  . Drug use: No   No Known Allergies    Medications: Outpatient Medications Prior to Visit  Medication Sig  . aspirin 81 MG tablet Take 81 mg by mouth.   . fluticasone (FLONASE) 50 MCG/ACT nasal spray Place 1 spray into both nostrils daily.  . hydrochlorothiazide (HYDRODIURIL) 25 MG tablet TAKE 1 TABLET DAILY.  Marland Kitchen ibuprofen (ADVIL) 600 MG tablet Take 600 mg by mouth every 6 (six) hours as needed.  Marland Kitchen levocetirizine (XYZAL) 5 MG tablet Take 1 tablet (5 mg total) by mouth every evening.  Marland Kitchen levothyroxine (SYNTHROID) 50 MCG tablet TAKE 1 TABLET DAILY BEFORE BREAKFAST.  Marland Kitchen losartan (COZAAR) 100 MG  tablet TAKE 1 TABLET(100 MG) BY MOUTH DAILY  . metoprolol succinate (TOPROL-XL) 25 MG 24 hr tablet TAKE 1 TABLET DAILY . TAKE WITH OR IMMEDIATELY        FOLLOWING A MEAL.  . MULTIPLE VITAMIN PO Take by mouth daily.   Marland Kitchen omeprazole (PRILOSEC) 20 MG capsule TAKE 1 CAPSULE TWICE DAILY BEFORE MEALS  . sertraline (ZOLOFT) 100 MG tablet Take 1 tablet (100 mg total) by mouth daily.  . vitamin B-12 (CYANOCOBALAMIN) 100 MCG tablet Take 200  mcg by mouth daily.  . [DISCONTINUED] gabapentin (NEURONTIN) 100 MG capsule Take 1 capsule (100 mg total) by mouth 2 (two) times daily. (Patient not taking: Reported on 09/21/2020)  . [DISCONTINUED] HYDROcodone-homatropine (HYCODAN) 5-1.5 MG/5ML syrup Take 5 mLs by mouth every 8 (eight) hours as needed for cough.  . [DISCONTINUED] OMEGA-3 FATTY ACIDS PO Take 1,040 mg by mouth daily. (Patient not taking: Reported on 10/01/2020)  . [DISCONTINUED] quinapril (ACCUPRIL) 40 MG tablet Take 40 mg by mouth at bedtime. (Patient not taking: Reported on 10/01/2020)  . [DISCONTINUED] sertraline (ZOLOFT) 50 MG tablet Take 50 mg by mouth daily. (Patient not taking: Reported on 10/01/2020)   No facility-administered medications prior to visit.    Review of Systems  Constitutional: Positive for fatigue. Negative for appetite change, chills and fever.  HENT: Positive for congestion, sinus pressure, sinus pain and sore throat. Negative for ear pain and rhinorrhea.   Eyes: Positive for pain.  Respiratory: Positive for cough, shortness of breath and wheezing. Negative for chest tightness.   Cardiovascular: Negative for chest pain and palpitations.  Gastrointestinal: Positive for abdominal pain and diarrhea. Negative for nausea and vomiting.  Neurological: Positive for headaches. Negative for dizziness and weakness.    Last CBC Lab Results  Component Value Date   WBC 7.2 10/17/2019   HGB 14.6 10/17/2019   HCT 44.3 10/17/2019   MCV 87 10/17/2019   MCH 28.7 10/17/2019   RDW 13.4 10/17/2019   PLT 266 74/25/9563   Last metabolic panel Lab Results  Component Value Date   GLUCOSE 88 10/17/2019   NA 141 10/17/2019   K 3.9 10/17/2019   CL 102 10/17/2019   CO2 23 10/17/2019   BUN 15 10/17/2019   CREATININE 0.92 10/17/2019   GFRNONAA 63 10/17/2019   GFRAA 72 10/17/2019   CALCIUM 9.3 10/17/2019   PROT 6.5 10/17/2019   ALBUMIN 4.0 10/17/2019   LABGLOB 2.5 10/17/2019   AGRATIO 1.6 10/17/2019   BILITOT 0.3  10/17/2019   ALKPHOS 75 10/17/2019   AST 26 10/17/2019   ALT 25 10/17/2019   ANIONGAP 8 05/16/2018      Objective    Temp 98 F (36.7 C) (Oral)   Wt 200 lb (90.7 kg)   BMI 32.28 kg/m  BP Readings from Last 3 Encounters:  07/21/20 (!) 130/58  01/23/20 (!) 99/39  12/30/19 117/66   Wt Readings from Last 3 Encounters:  10/01/20 200 lb (90.7 kg)  07/21/20 207 lb (93.9 kg)  01/23/20 195 lb (88.5 kg)     Patient is comfortable on the phone call.  She does have a hacking cough but is alert and oriented.  She answers in complete sentences and has no obvious wheezing or shortness of breath.   Assessment & Plan     1. Cough Try Robitussin DM twice a day.  Patient declines narcotic cough medicine.  She declines steroids.  Consider albuterol inhaler but she has never used one before.  She is quit smoking.  In addition to increasing fluids of recommend vitamin C vitamin D and zinc daily 2. COVID-19 Patient declines COVID PCR test but almost certainly she does have COVID with direct exposure and symptoms 2 days after this but exposure.  Advised her of quarantine and distancing and masking.  3. Viral upper respiratory tract infection No antibiotics indicated at this time.   No follow-ups on file.    I discussed the assessment and treatment plan with the patient. The patient was provided an opportunity to ask questions and all were answered. The patient agreed with the plan and demonstrated an understanding of the instructions.   The patient was advised to call back or seek an in-person evaluation if the symptoms worsen or if the condition fails to improve as anticipated.  I provided 14 minutes of non-face-to-face time during this encounter.  I, [name], MD, have reviewed all documentation for this visit. The documentation on 10/01/20 for the exam, diagnosis, procedures, and orders are all accurate and complete.   Xavier Munger Cranford Mon, MD [facility] [phone] (phone) [phone] (fax)  [place]

## 2020-10-08 ENCOUNTER — Ambulatory Visit
Admission: RE | Admit: 2020-10-08 | Discharge: 2020-10-08 | Disposition: A | Discharge status: Home / Self Care | Payer: Medicare HMO | Source: Ambulatory Visit | Attending: Family Medicine | Admitting: Family Medicine

## 2020-10-08 ENCOUNTER — Other Ambulatory Visit: Payer: Self-pay

## 2020-10-08 ENCOUNTER — Encounter: Payer: Self-pay | Admitting: Family Medicine

## 2020-10-08 ENCOUNTER — Telehealth (INDEPENDENT_AMBULATORY_CARE_PROVIDER_SITE_OTHER): Payer: Medicare HMO | Admitting: Family Medicine

## 2020-10-08 DIAGNOSIS — U071 COVID-19: Secondary | ICD-10-CM | POA: Diagnosis not present

## 2020-10-08 DIAGNOSIS — Z20822 Contact with and (suspected) exposure to covid-19: Secondary | ICD-10-CM | POA: Diagnosis not present

## 2020-10-08 DIAGNOSIS — J1282 Pneumonia due to coronavirus disease 2019: Secondary | ICD-10-CM | POA: Diagnosis not present

## 2020-10-08 MED ORDER — BENZONATATE 100 MG PO CAPS
100.0000 mg | ORAL_CAPSULE | Freq: Two times a day (BID) | ORAL | 0 refills | Status: DC | PRN
Start: 2020-10-08 — End: 2021-11-19

## 2020-10-08 NOTE — Progress Notes (Signed)
Virtual telephone visit    Virtual Visit via Telephone Note   This visit type was conducted due to national recommendations for restrictions regarding the COVID-19 Pandemic (e.g. social distancing) in an effort to limit this patient's exposure and mitigate transmission in our community. Due to her co-morbid illnesses, this patient is at least at moderate risk for complications without adequate follow up. This format is felt to be most appropriate for this patient at this time. The patient did not have access to video technology or had technical difficulties with video requiring transitioning to audio format only (telephone). Physical exam was limited to content and character of the telephone converstion.     Patient location: home Provider location: home office Persons involved in the visit: patient, provider  I discussed the limitations of evaluation and management by telemedicine and the availability of in person appointments. The patient expressed understanding and agreed to proceed.   Visit Date: 10/08/2020  Today's healthcare provider: Lavon Paganini, MD   Chief Complaint  Patient presents with  . URI    Patient reports she is getting over [place] and is concerned that it is getting in her chest. Patient requesting chest x-ray Patient reports that her neighbors had COVID and they had pneumonia and one of them died.    Subjective    HPI HPI    URI    Associated symptoms inlclude congestion, cough, shortness of breath and wheezing.  Onset: 09/29/2020.  The problem has been gradually worsening since onset.  The temperature has been with in normal range.  Patient  is drinking plenty of fluids.  Patient is not a smoker. Additional comments: Patient reports she is getting over [place] and is concerned that it is getting in her chest. Patient requesting chest x-ray Patient reports that her neighbors had COVID and they had pneumonia and one of them died.        Last edited by Dorian Pod, CMA on 10/08/2020 10:17 AM. (History)     Did not test, because she had many COVID exposures, so presumed COVID after talking to PCP.  Was initially a head cold, but seems to be causing more productive cough.  She has had neighbors die from COVID pneumonia and she is worried about this possibility.  Denies any fever.  +SOB with exertion or coughing fits.  Patient Active Problem List   Diagnosis Date Noted  . Incisional hernia, without obstruction or gangrene   . Supraumbilical hernia 03/07/9484  . Anxiety 03/20/2015  . Arthritis 03/20/2015  . 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 Clinical depression 03/20/2015  . Malignant neoplasm of corpus uteri, except isthmus ([place]) 03/20/2015  . Essential (primary) hypertension 03/20/2015  . HLD (hyperlipidemia) 03/20/2015  . Cannot sleep 03/20/2015  . Osteopenia 03/20/2015  . Adult hypothyroidism 03/20/2015   Social History   Tobacco Use  . Smoking status: Former Smoker    Quit date: 09/11/1997    Years since quitting: 23.0  . Smokeless tobacco: Never Used  Vaping Use  . Vaping Use: Never used  Substance Use Topics  . Alcohol use: No    Alcohol/week: 0.0 standard drinks  . Drug use: No   No Known Allergies    Medications: Outpatient Medications Prior to Visit  Medication Sig  . aspirin 81 MG tablet Take 81 mg by mouth.   . fluticasone (FLONASE) 50 MCG/ACT nasal spray Place 1 spray into both nostrils daily.  . hydrochlorothiazide (HYDRODIURIL) 25 MG tablet TAKE 1 TABLET DAILY.  Marland Kitchen  ibuprofen (ADVIL) 600 MG tablet Take 600 mg by mouth every 6 (six) hours as needed.  Marland Kitchen levocetirizine (XYZAL) 5 MG tablet Take 1 tablet (5 mg total) by mouth every evening.  Marland Kitchen levothyroxine (SYNTHROID) 50 MCG tablet TAKE 1 TABLET DAILY BEFORE BREAKFAST.  Marland Kitchen losartan (COZAAR) 100 MG tablet TAKE 1 TABLET(100 MG) BY MOUTH DAILY  . metoprolol succinate (TOPROL-XL) 25 MG 24 hr tablet TAKE 1 TABLET DAILY . TAKE WITH OR IMMEDIATELY         FOLLOWING A MEAL.  . MULTIPLE VITAMIN PO Take by mouth daily.   Marland Kitchen omeprazole (PRILOSEC) 20 MG capsule TAKE 1 CAPSULE TWICE DAILY BEFORE MEALS  . sertraline (ZOLOFT) 100 MG tablet Take 1 tablet (100 mg total) by mouth daily.  . vitamin B-12 (CYANOCOBALAMIN) 100 MCG tablet Take 200 mcg by mouth daily.   No facility-administered medications prior to visit.    Review of Systems  Constitutional: Positive for activity change. Negative for chills and fever.  HENT: Positive for congestion, postnasal drip, sinus pressure and sinus pain. Negative for ear pain and sore throat.   Respiratory: Positive for cough, shortness of breath and wheezing.   Cardiovascular: Negative for chest pain and palpitations.  Gastrointestinal: Negative for abdominal pain, diarrhea, nausea and vomiting.    Last CBC Lab Results  Component Value Date   WBC 7.2 10/17/2019   HGB 14.6 10/17/2019   HCT 44.3 10/17/2019   MCV 87 10/17/2019   MCH 28.7 10/17/2019   RDW 13.4 10/17/2019   PLT 266 [id number]   Last metabolic panel Lab Results  Component Value Date   GLUCOSE 88 10/17/2019   NA 141 10/17/2019   K 3.9 10/17/2019   CL 102 10/17/2019   CO2 23 10/17/2019   BUN 15 10/17/2019   CREATININE 0.92 10/17/2019   GFRNONAA 63 10/17/2019   GFRAA 72 10/17/2019   CALCIUM 9.3 10/17/2019   PROT 6.5 10/17/2019   ALBUMIN 4.0 10/17/2019   LABGLOB 2.5 10/17/2019   AGRATIO 1.6 10/17/2019   BILITOT 0.3 10/17/2019   ALKPHOS 75 10/17/2019   AST 26 10/17/2019   ALT 25 10/17/2019   ANIONGAP 8 05/16/2018      Objective    There were no vitals taken for this visit. BP Readings from Last 3 Encounters:  07/21/20 (!) 130/58  01/23/20 (!) 99/39  12/30/19 117/66   Wt Readings from Last 3 Encounters:  10/01/20 200 lb (90.7 kg)  07/21/20 207 lb (93.9 kg)  01/23/20 195 lb (88.5 kg)     Speaking in full sentences with no respiratory distress   Assessment & Plan     1. Suspected COVID-19 virus infection - symptoms  c/w viral URI  - no evidence of strep pharyngitis, CAP, AOM, bacterial sinusitis, or other bacterial infection - concern for possible COVID19 infection - concern for possible COVID pneumonia, so will get CXR - will send for outpatient testing - discussed need to quarantine 10 days from start of symptoms (or possibly 5 days with new CDC recommendations) and until fever-free for at least 24 hours - discussed symptomatic management, natural course, and return precautions   - DG Chest 2 View; Future   Meds ordered this encounter  Medications  . benzonatate (TESSALON) 100 MG capsule    Sig: Take 1 capsule (100 mg total) by mouth 2 (two) times daily as needed for cough.    Dispense:  20 capsule    Refill:  0     Return if symptoms worsen or  fail to improve.    I discussed the assessment and treatment plan with the patient. The patient was provided an opportunity to ask questions and all were answered. The patient agreed with the plan and demonstrated an understanding of the instructions.   The patient was advised to call back or seek an in-person evaluation if the symptoms worsen or if the condition fails to improve as anticipated.  I provided 14 minutes of non-face-to-face time during this encounter.  I, Lavon Paganini, MD, have reviewed all documentation for this visit. The documentation on 10/08/20 for the exam, diagnosis, procedures, and orders are all accurate and complete.   Bacigalupo, Dionne Bucy, MD, MPH [facility]

## 2020-10-08 NOTE — Patient Instructions (Signed)

## 2020-10-10 ENCOUNTER — Encounter: Payer: Self-pay | Admitting: Emergency Medicine

## 2020-10-10 ENCOUNTER — Emergency Department
Admission: EM | Admit: 2020-10-10 | Discharge: 2020-10-10 | Disposition: A | Discharge status: Home / Self Care | Payer: Medicare HMO | Attending: Emergency Medicine | Admitting: Emergency Medicine

## 2020-10-10 ENCOUNTER — Emergency Department (HOSPITAL_BASED_OUTPATIENT_CLINIC_OR_DEPARTMENT_OTHER): Payer: Medicare HMO

## 2020-10-10 ENCOUNTER — Other Ambulatory Visit: Payer: Self-pay

## 2020-10-10 ENCOUNTER — Emergency Department: Payer: Medicare HMO

## 2020-10-10 DIAGNOSIS — R52 Pain, unspecified: Secondary | ICD-10-CM | POA: Diagnosis not present

## 2020-10-10 DIAGNOSIS — Z7982 Long term (current) use of aspirin: Secondary | ICD-10-CM | POA: Diagnosis not present

## 2020-10-10 DIAGNOSIS — R109 Unspecified abdominal pain: Secondary | ICD-10-CM | POA: Diagnosis not present

## 2020-10-10 DIAGNOSIS — E039 Hypothyroidism, unspecified: Secondary | ICD-10-CM | POA: Diagnosis not present

## 2020-10-10 DIAGNOSIS — R1084 Generalized abdominal pain: Secondary | ICD-10-CM

## 2020-10-10 DIAGNOSIS — I10 Essential (primary) hypertension: Secondary | ICD-10-CM | POA: Insufficient documentation

## 2020-10-10 DIAGNOSIS — Z79899 Other long term (current) drug therapy: Secondary | ICD-10-CM | POA: Diagnosis not present

## 2020-10-10 DIAGNOSIS — N281 Cyst of kidney, acquired: Secondary | ICD-10-CM | POA: Insufficient documentation

## 2020-10-10 DIAGNOSIS — K5730 Diverticulosis of large intestine without perforation or abscess without bleeding: Secondary | ICD-10-CM | POA: Diagnosis not present

## 2020-10-10 DIAGNOSIS — Z87891 Personal history of nicotine dependence: Secondary | ICD-10-CM | POA: Insufficient documentation

## 2020-10-10 DIAGNOSIS — K8080 Other cholelithiasis without obstruction: Secondary | ICD-10-CM | POA: Diagnosis not present

## 2020-10-10 DIAGNOSIS — R1013 Epigastric pain: Secondary | ICD-10-CM | POA: Diagnosis not present

## 2020-10-10 DIAGNOSIS — Z209 Contact with and (suspected) exposure to unspecified communicable disease: Secondary | ICD-10-CM | POA: Diagnosis not present

## 2020-10-10 DIAGNOSIS — Z8541 Personal history of malignant neoplasm of cervix uteri: Secondary | ICD-10-CM | POA: Diagnosis not present

## 2020-10-10 DIAGNOSIS — K449 Diaphragmatic hernia without obstruction or gangrene: Secondary | ICD-10-CM | POA: Diagnosis not present

## 2020-10-10 DIAGNOSIS — K8020 Calculus of gallbladder without cholecystitis without obstruction: Secondary | ICD-10-CM | POA: Diagnosis not present

## 2020-10-10 LAB — URINALYSIS, COMPLETE (UACMP) WITH MICROSCOPIC
Bacteria, UA: NONE SEEN
Bilirubin Urine: NEGATIVE
Glucose, UA: NEGATIVE mg/dL
Hgb urine dipstick: NEGATIVE
Ketones, ur: NEGATIVE mg/dL
Nitrite: NEGATIVE
Protein, ur: NEGATIVE mg/dL
Specific Gravity, Urine: 1.026 (ref 1.005–1.030)
pH: 5 (ref 5.0–8.0)

## 2020-10-10 LAB — CBC
HCT: 44.3 % (ref 36.0–46.0)
Hemoglobin: 14.4 g/dL (ref 12.0–15.0)
MCH: 28.9 pg (ref 26.0–34.0)
MCHC: 32.5 g/dL (ref 30.0–36.0)
MCV: 88.8 fL (ref 80.0–100.0)
Platelets: 257 10*3/uL (ref 150–400)
RBC: 4.99 MIL/uL (ref 3.87–5.11)
RDW: 13.6 % (ref 11.5–15.5)
WBC: 17.4 10*3/uL — ABNORMAL HIGH (ref 4.0–10.5)
nRBC: 0 % (ref 0.0–0.2)

## 2020-10-10 LAB — COMPREHENSIVE METABOLIC PANEL
ALT: 17 U/L (ref 0–44)
AST: 22 U/L (ref 15–41)
Albumin: 4.1 g/dL (ref 3.5–5.0)
Alkaline Phosphatase: 78 U/L (ref 38–126)
Anion gap: 11 (ref 5–15)
BUN: 21 mg/dL (ref 8–23)
CO2: 29 mmol/L (ref 22–32)
Calcium: 9.5 mg/dL (ref 8.9–10.3)
Chloride: 99 mmol/L (ref 98–111)
Creatinine, Ser: 1.05 mg/dL — ABNORMAL HIGH (ref 0.44–1.00)
GFR, Estimated: 56 mL/min — ABNORMAL LOW (ref >60)
Glucose, Bld: 148 mg/dL — ABNORMAL HIGH (ref 70–99)
Potassium: 3.6 mmol/L (ref 3.5–5.1)
Sodium: 139 mmol/L (ref 135–145)
Total Bilirubin: 0.5 mg/dL (ref 0.3–1.2)
Total Protein: 7.8 g/dL (ref 6.5–8.1)

## 2020-10-10 LAB — TROPONIN I (HIGH SENSITIVITY)
Troponin I (High Sensitivity): 8 ng/L (ref <18)
Troponin I (High Sensitivity): 5 ng/L (ref <18)

## 2020-10-10 LAB — LIPASE, BLOOD: Lipase: 35 U/L (ref 11–51)

## 2020-10-10 MED ORDER — IOHEXOL 350 MG/ML SOLN
100.0000 mL | Freq: Once | INTRAVENOUS | Status: AC | PRN
  Administered 2020-10-10: 100 mL via INTRAVENOUS

## 2020-10-10 MED ORDER — FENTANYL CITRATE (PF) 100 MCG/2ML IJ SOLN: 50.0000 ug | Freq: Once | INTRAMUSCULAR | Status: DC

## 2020-10-10 MED ORDER — ONDANSETRON HCL 4 MG/2ML IJ SOLN: 4.0000 mg | Freq: Once | INTRAMUSCULAR | Status: DC

## 2020-10-10 NOTE — ED Provider Notes (Signed)
[facility] Emergency Department Provider Note  ____________________________________________  Time seen: Approximately 4:42 AM  I have reviewed the triage vital signs and the nursing notes.   HISTORY  Chief Complaint Abdominal Pain   HPI Rebecca Patton is a 75 y.o. female with a history of supraumbilical hernia status post repair in 2019, hysterectomy, hypothyroidism, arthritis, depression, hypertension who presents for evaluation of abdominal pain.  Patient reports the pain started around 6 PM.  The pain was severe, sharp, located in the epigastric region, radiating straight to the back and down to the suprapubic region.  Patient denies nausea, vomiting, chest pain, shortness of breath, fever, chills, dysuria, hematuria, diarrhea, constipation.  Last BM was yesterday.  At this time she reports that the pain has resolved and she only has pain if she pushes on her abdomen.  She denies ever having similar pain.   Past Medical History:  Diagnosis Date  . Arthritis   . Depression   . Dysrhythmia    ST  . High blood pressure   . Hypothyroidism   . MRSA infection 2006   on nose  . Uterine cancer [facility]) 2009    Patient Active Problem List   Diagnosis Date Noted  . Incisional hernia, without obstruction or gangrene   . Supraumbilical hernia [id number]  . Anxiety 03/20/2015  . Arthritis 03/20/2015  . 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 Clinical depression 03/20/2015  . Malignant neoplasm of corpus uteri, except isthmus ([place]) 03/20/2015  . Essential (primary) hypertension 03/20/2015  . HLD (hyperlipidemia) 03/20/2015  . Cannot sleep 03/20/2015  . Osteopenia 03/20/2015  . Adult hypothyroidism 03/20/2015    Past Surgical History:  Procedure Laterality Date  . ABDOMINAL HYSTERECTOMY  2009    BSO  . BUNIONECTOMY    . COLONOSCOPY WITH PROPOFOL N/A 01/23/2020   Procedure: COLONOSCOPY WITH PROPOFOL;  Surgeon: Toledo, Benay Pike, MD;  Location: [facility]  ENDOSCOPY;  Service: Gastroenterology;  Laterality: N/A;  . EXCISION NEUROMA    . INSERTION OF MESH N/A 05/18/2018   Procedure: INSERTION OF MESH;  Surgeon: Vickie Epley, MD;  Location: [facility] ORS;  Service: General;  Laterality: N/A;  . NASAL SINUS SURGERY    . TUBAL LIGATION    . UMBILICAL HERNIA REPAIR N/A 05/18/2018   Procedure: HERNIA REPAIR SUPRA-UMBILICAL ADULT;  Surgeon: Vickie Epley, MD;  Location: [facility] ORS;  Service: General;  Laterality: N/A;    Prior to Admission medications   Medication Sig Start Date End Date Taking? Authorizing Provider  aspirin 81 MG tablet Take 81 mg by mouth.     [provider]  benzonatate (TESSALON) 100 MG capsule Take 1 capsule (100 mg total) by mouth 2 (two) times daily as needed for cough. 10/08/20   Virginia Crews, MD  fluticasone (FLONASE) 50 MCG/ACT nasal spray Place 1 spray into both nostrils daily. 04/30/20   Jerrol Banana., MD  hydrochlorothiazide (HYDRODIURIL) 25 MG tablet TAKE 1 TABLET DAILY. 12/09/19   Jerrol Banana., MD  ibuprofen (ADVIL) 600 MG tablet Take 600 mg by mouth every 6 (six) hours as needed.    [provider]  levocetirizine (XYZAL) 5 MG tablet Take 1 tablet (5 mg total) by mouth every evening. 12/19/19   Trinna Post, PA-C  levothyroxine (SYNTHROID) 50 MCG tablet TAKE 1 TABLET DAILY BEFORE BREAKFAST. 12/09/19   Jerrol Banana., MD  losartan (COZAAR) 100 MG tablet TAKE 1 TABLET(100 MG) BY MOUTH DAILY 12/30/19  Fenton Malling M, PA-C  metoprolol succinate (TOPROL-XL) 25 MG 24 hr tablet TAKE 1 TABLET DAILY . TAKE WITH OR IMMEDIATELY        FOLLOWING A MEAL. 11/30/19   Jerrol Banana., MD  MULTIPLE VITAMIN PO Take by mouth daily.     [provider]  omeprazole (PRILOSEC) 20 MG capsule TAKE 1 CAPSULE TWICE DAILY BEFORE MEALS 07/21/20   Jerrol Banana., MD  sertraline (ZOLOFT) 100 MG tablet Take 1 tablet (100 mg total) by mouth daily. 11/21/19   Jerrol Banana., MD  vitamin B-12 (CYANOCOBALAMIN) 100 MCG tablet Take 200 mcg by mouth daily.    [provider]    Allergies Patient has no known allergies.  Family History  Problem Relation Age of Onset  . Parkinson's disease Mother   . Diabetes Father   . CAD Father   . Cancer Sister        squamous cell cancer in her eye  . Pancreatic cancer Brother   . Tremor Brother   . Breast cancer Neg Hx     Social History Social History   Tobacco Use  . Smoking status: Former Smoker    Quit date: 09/11/1997    Years since quitting: 23.0  . Smokeless tobacco: Never Used  Vaping Use  . Vaping Use: Never used  Substance Use Topics  . Alcohol use: No    Alcohol/week: 0.0 standard drinks  . Drug use: No    Review of Systems  Constitutional: Negative for fever. Eyes: Negative for visual changes. ENT: Negative for sore throat. Neck: No neck pain  Cardiovascular: Negative for chest pain. Respiratory: Negative for shortness of breath. Gastrointestinal: + abdominal pain. No vomiting or diarrhea. Genitourinary: Negative for dysuria. Musculoskeletal: Negative for back pain. Skin: Negative for rash. Neurological: Negative for headaches, weakness or numbness. Psych: No SI or HI  ____________________________________________   PHYSICAL EXAM:  VITAL SIGNS: ED Triage Vitals [10/10/20 0125]  Enc Vitals Group     BP 137/72     Pulse Rate 80     Resp 20     Temp 98.9 F (37.2 C)     Temp Source Oral     SpO2 96 %     Weight 200 lb (90.7 kg)     Height 5\' 5"  (1.651 m)     Head Circumference      Peak Flow      Pain Score 0     Pain Loc      Pain Edu?      Excl. in [place]?     Constitutional: Alert and oriented. Well appearing and in no apparent distress. HEENT:      Head: Normocephalic and atraumatic.         Eyes: Conjunctivae are normal. Sclera is non-icteric.       Mouth/Throat: Mucous membranes are moist.       Neck: Supple with no signs of  meningismus. Cardiovascular: Regular rate and rhythm. No murmurs, gallops, or rubs. 2+ symmetrical distal pulses are present in all extremities. No JVD. Respiratory: Normal respiratory effort. Lungs are clear to auscultation bilaterally. Gastrointestinal: Soft, diffusely tender to palpation, non distended with positive bowel sounds. No rebound or guarding. Genitourinary: No CVA tenderness. Musculoskeletal: No edema, cyanosis, or erythema of extremities. Neurologic: Normal speech and language. Face is symmetric. Moving all extremities. No gross focal neurologic deficits are appreciated. Skin: Skin is warm, dry and intact. No rash noted. Psychiatric: Mood and affect  are normal. Speech and behavior are normal.  ____________________________________________   LABS (all labs ordered are listed, but only abnormal results are displayed)  Labs Reviewed  COMPREHENSIVE METABOLIC PANEL - Abnormal; Notable for the following components:      Result Value   Glucose, Bld 148 (*)    Creatinine, Ser 1.05 (*)    GFR, Estimated 56 (*)    All other components within normal limits  CBC - Abnormal; Notable for the following components:   WBC 17.4 (*)    All other components within normal limits  URINALYSIS, COMPLETE (UACMP) WITH MICROSCOPIC - Abnormal; Notable for the following components:   Color, Urine YELLOW (*)    APPearance HAZY (*)    Leukocytes,Ua SMALL (*)    All other components within normal limits  LIPASE, BLOOD  TROPONIN I (HIGH SENSITIVITY)  TROPONIN I (HIGH SENSITIVITY)   ____________________________________________  EKG  ED ECG REPORT I, Rudene Re, the attending physician, personally viewed and interpreted this ECG.  Normal sinus rhythm, rate of 80, normal intervals, normal axis, diffuse T wave flattening with no ST elevations or depressions.  Unchanged from prior from 2019. ____________________________________________  RADIOLOGY  I have personally reviewed the images  performed during this visit and I agree with the Radiologist's read.   Interpretation by Radiologist:  CT Angio Abd/Pel W and/or Wo Contrast  Result Date: 10/10/2020 CLINICAL DATA:  Abdominal pain. Sudden sharp pain that resolved with standing. EXAM: CTA ABDOMEN AND PELVIS WITHOUT AND WITH CONTRAST TECHNIQUE: Multidetector CT imaging of the abdomen and pelvis was performed using the standard protocol during bolus administration of intravenous contrast. Multiplanar reconstructed images and MIPs were obtained and reviewed to evaluate the vascular anatomy. CONTRAST:  185mL OMNIPAQUE IOHEXOL 350 MG/ML SOLN COMPARISON:  08/27/2019 FINDINGS: VASCULAR Aorta: Atheromatous calcification diffusely. Negative for aneurysm or dissection. Celiac: Mild atheromatous plaque at the origin. No branch occlusion, stenosis, or beading. SMA: Patent without evidence of aneurysm, dissection, vasculitis or significant stenosis. Renals: Atheromatous calcification proximally without suspected flow limiting stenosis. No beading or aneurysm IMA: Patent Inflow: Mild atheromatous plaque. No stenosis, aneurysm, or dissection Proximal Outflow: Unremarkable Veins: Unremarkable Review of the MIP images confirms the above findings. NON-VASCULAR Lower chest:  No contributory findings. Hepatobiliary: No focal liver abnormality.Atheromatous calcification. Pancreas: Unremarkable. Spleen: Unremarkable. Adrenals/Urinary Tract: Negative adrenals. 9.2 cm left renal cyst with coarse calcification posteriorly, stable and benign-appearing. Unremarkable bladder. Stomach/Bowel: No obstruction. No appendicitis. Scattered colonic and small bowel diverticula. No convincing diverticulitis. One of the small bowel diverticula is prominent in size and lobulated in contour, unchanged from 2020 at approximately 2.5 cm. Small sliding hiatal hernia. Lymphatic: No mass or adenopathy. Reproductive:Hysterectomy. Other: No ascites or pneumoperitoneum. Midline hernia repair  using mesh. Musculoskeletal: No acute abnormalities. Remote L3 superior endplate fracture. IMPRESSION: VASCULAR 1. No emergent finding.  No aortic dissection or aneurysm. 2.  Aortic Atherosclerosis (ICD10-I70.0). NON-VASCULAR 1. Colonic and small bowel diverticulosis. 2. Cholelithiasis without findings of cholecystitis. Electronically Signed   By: Monte Fantasia M.D.   On: 10/10/2020 04:43     ____________________________________________   PROCEDURES  Procedure(s) performed: None Procedures Critical Care performed:  None ____________________________________________   INITIAL IMPRESSION / ASSESSMENT AND PLAN / ED COURSE  75 y.o. female with a history of supraumbilical hernia status post repair in 2019, hysterectomy, hypothyroidism, arthritis, depression, hypertension who presents for evaluation of sudden onset severe, sharp, epigastric abdominal pain radiating to the back and to the suprapubic region.  At this time patient reports that her pain  has resolved however on palpation she still has mild diffuse tenderness throughout with no localized tenderness, rebound or guarding.  EKG showing no signs of acute ischemic changes.  Ddx dissection, kidney stone, gallbladder pathology, bowel perforation, sbo, strangulated/ incarcerated hernia.  Labs show leukocytosis with white count of 17.4, normal LFTs and lipase, normal electrolytes.  UA with no evidence of UTI or blood.  CT angio of the abdomen and pelvis visualized by me showing a large renal cyst, cholelithiasis but no acute findings, read confirmed by radiology. Pain possibly due to a passed gallbladder calculus. Troponin x2 negative with no signs of cardiac ischemia.  Patient remains pain free and with normal work up, she is stable for dc home with f/u with her doctor. Will also refer her to surgery for GB evaluation. Discussed gallbladder eating plan and my standard return precautions. Patient passed PO challenge. No abdominal tenderness or  recurrence of pain.      _____________________________________________ Please note:  Patient was evaluated in Emergency Department today for the symptoms described in the history of present illness. Patient was evaluated in the context of the global COVID-19 pandemic, which necessitated consideration that the patient might be at risk for infection with the SARS-CoV-2 virus that causes COVID-19. Institutional protocols and algorithms that pertain to the evaluation of patients at risk for COVID-19 are in a state of rapid change based on information released by regulatory bodies including the CDC and federal and state organizations. These policies and algorithms were followed during the patient's care in the ED.  Some ED evaluations and interventions may be delayed as a result of limited staffing during the pandemic.   [place] Controlled Substance Database was reviewed by me. ____________________________________________   FINAL CLINICAL IMPRESSION(S) / ED DIAGNOSES   Final diagnoses:  Generalized abdominal pain  Calculus of gallbladder without cholecystitis without obstruction  Renal cyst      NEW MEDICATIONS STARTED DURING THIS VISIT:  ED Discharge Orders    None       Note:  This document was prepared using Dragon voice recognition software and may include unintentional dictation errors.    Rudene Re, MD 10/10/20 [phone]

## 2020-10-10 NOTE — ED Notes (Signed)
Patient transported to CT 

## 2020-10-10 NOTE — ED Notes (Signed)
Pt asked twice about pain meds, refused twice "it only hurts when somebody pushes on it", pt told she could get meds later if desired

## 2020-10-10 NOTE — ED Notes (Addendum)
Pt c/o "like gas" pain (sharp and cramping) 10/10 pain that resolved to 0/10 after standing and sitting up, pt reports onset 1800, last BM yesterday normal  Abdominal history of total hysterectomy

## 2020-10-10 NOTE — ED Notes (Signed)
Pt returned from CT att; denies need for meds, reports "a little pain" and rubs umbilicus area

## 2020-10-10 NOTE — ED Triage Notes (Signed)
Pt presents to ER from home via EMS with Epigastric pain that started 2 hrs ago. Pt reports pain felt "excruciating gas pain" reports pain radiated to back. Pt denies any back pain at present, reports pain is better at present. Denies any nausea, vomiting, diarrhea or shortness of breath. Pt talks in complete sentences no distress

## 2020-10-10 NOTE — ED Notes (Signed)
Patient to waiting room via wheelchair by EMS. Per EMS patient with complaint of abd pain since 11 pm and goes straight through to back, laying down makes pain worse.  176/78, 12-lead normal sinus rhythm hr 80's, cbg 187.

## 2020-10-14 ENCOUNTER — Ambulatory Visit (INDEPENDENT_AMBULATORY_CARE_PROVIDER_SITE_OTHER): Payer: Medicare HMO | Admitting: Surgery

## 2020-10-14 ENCOUNTER — Ambulatory Visit (INDEPENDENT_AMBULATORY_CARE_PROVIDER_SITE_OTHER): Payer: Medicare HMO | Admitting: Family Medicine

## 2020-10-14 ENCOUNTER — Encounter: Payer: Self-pay | Admitting: Surgery

## 2020-10-14 ENCOUNTER — Other Ambulatory Visit: Payer: Self-pay

## 2020-10-14 DIAGNOSIS — K8020 Calculus of gallbladder without cholecystitis without obstruction: Secondary | ICD-10-CM

## 2020-10-14 NOTE — H&P (View-Only) (Signed)
10/14/2020  Reason for Visit:  Cholelithiasis  History of Present Illness: Rebecca Patton is a 75 y.o. female presenting for evaluation of cholelithiasis.  She had been doing well at home until Friday night 1/28, when she started having epigastric abdominal pain that was very severe.  Was not associated with nausea or vomiting, but it did radiate to her back.  Given the severity, she called EMS and was brought to the ER.  By the time she saw the ED provider, her pain has subsided for the most part.  Her laboratory workup was significant for an elevated WBC of 17.4, but her LFTs were normal.  She had a CTA abdomen/pelvis and there was no aortic dissection or aneurysm, and the only other main finding was cholelithiasis.  She has prior ultrasound showing cholelithiasis, and the CTA did not show any surrounding inflammation to suggest cholecystitis.  As the patient was feeling much better, she was discharged home.  She has kept a low fat diet, and has not had other issues in the past few days.  The patient reports that overall since her supraumbilical hernia surgery, she has had some mid-abdominal pain at the incision site.  It has been evaluated before with a CT scan previously, and there was no new hernia or recurrence.    Her brother had the same issue of abdominal pain and was found to have cholecystitis and had a cholecystectomy done.  He also did not have any nausea or vomiting.  Past Medical History: Past Medical History:  Diagnosis Date   Arthritis    Depression    Dysrhythmia    ST   High blood pressure    Hypothyroidism    MRSA infection 2006   on nose   Uterine cancer ([place]) 2009     Past Surgical History: Past Surgical History:  Procedure Laterality Date   ABDOMINAL HYSTERECTOMY  2009    BSO   BUNIONECTOMY     COLONOSCOPY WITH PROPOFOL N/A 01/23/2020   Procedure: COLONOSCOPY WITH PROPOFOL;  Surgeon: Toledo, Benay Pike, MD;  Location: [facility];  Service:  Gastroenterology;  Laterality: N/A;   EXCISION NEUROMA     INSERTION OF MESH N/A 05/18/2018   Procedure: INSERTION OF MESH;  Surgeon: Vickie Epley, MD;  Location: [facility] ORS;  Service: General;  Laterality: N/A;   NASAL SINUS SURGERY     TUBAL LIGATION     UMBILICAL HERNIA REPAIR N/A 05/18/2018   Procedure: HERNIA REPAIR SUPRA-UMBILICAL ADULT;  Surgeon: Vickie Epley, MD;  Location: [facility] ORS;  Service: General;  Laterality: N/A;    Home Medications: Prior to Admission medications   Medication Sig Start Date End Date Taking? Authorizing Provider  aspirin 81 MG tablet Take 81 mg by mouth.    Yes [provider]  benzonatate (TESSALON) 100 MG capsule Take 1 capsule (100 mg total) by mouth 2 (two) times daily as needed for cough. 10/08/20  Yes Bacigalupo, Dionne Bucy, MD  fluticasone (FLONASE) 50 MCG/ACT nasal spray Place 1 spray into both nostrils daily. 04/30/20  Yes Jerrol Banana., MD  hydrochlorothiazide (HYDRODIURIL) 25 MG tablet TAKE 1 TABLET DAILY. 12/09/19  Yes Jerrol Banana., MD  levothyroxine (SYNTHROID) 50 MCG tablet TAKE 1 TABLET DAILY BEFORE BREAKFAST. 12/09/19  Yes Jerrol Banana., MD  losartan (COZAAR) 100 MG tablet TAKE 1 TABLET(100 MG) BY MOUTH DAILY 12/30/19  Yes Mar Daring, PA-C  metoprolol succinate (TOPROL-XL) 25 MG 24 hr tablet TAKE 1 TABLET  DAILY . TAKE WITH OR IMMEDIATELY        FOLLOWING A MEAL. 11/30/19  Yes Jerrol Banana., MD  MULTIPLE VITAMIN PO Take by mouth daily.    Yes [provider]  omeprazole (PRILOSEC) 20 MG capsule TAKE 1 CAPSULE TWICE DAILY BEFORE MEALS 07/21/20  Yes Jerrol Banana., MD  sertraline (ZOLOFT) 100 MG tablet Take 1 tablet (100 mg total) by mouth daily. 11/21/19  Yes Jerrol Banana., MD  vitamin B-12 (CYANOCOBALAMIN) 100 MCG tablet Take 200 mcg by mouth daily.   Yes [provider]    Allergies: No Known Allergies  Social History:  reports that she quit smoking  about 23 years ago. She has never used smokeless tobacco. She reports that she does not drink alcohol and does not use drugs.   Family History: Family History  Problem Relation Age of Onset   Parkinson's disease Mother    Diabetes Father    CAD Father    Cancer Sister        squamous cell cancer in her eye   Pancreatic cancer Brother    Tremor Brother    Breast cancer Neg Hx     Review of Systems: Review of Systems  Constitutional: Negative for chills and fever.  Respiratory: Negative for shortness of breath.   Cardiovascular: Negative for chest pain.  Gastrointestinal: Positive for abdominal pain. Negative for constipation, diarrhea, nausea and vomiting.  Genitourinary: Negative for dysuria.  Musculoskeletal: Negative for myalgias.  Skin: Negative for rash.  Neurological: Negative for dizziness.  Psychiatric/Behavioral: Negative for depression.    Physical Exam There were no vitals taken for this visit. CONSTITUTIONAL: Temp 98.3, BP 151/72, O2 sat 97%, Height 5'5", Weight 203 lbs. HEENT:  Normocephalic, atraumatic, extraocular motion intact. NECK: Trachea is midline, and there is no jugular venous distension.  RESPIRATORY:  Normal respiratory effort without pathologic use of accessory muscles. CARDIOVASCULAR:  Regular rhythm and rate. GI: The abdomen is soft, non-distended, currently non-tender to palpation.  The supraumbilical hernia incision has healed well, with firm tissue beneath it consistent with scar tissue but no recurrence. Negative Murphy's sign. MUSCULOSKELETAL:  Normal muscle strength and tone in all four extremities.  No peripheral edema or cyanosis. SKIN: Skin turgor is normal. There are no pathologic skin lesions.  NEUROLOGIC:  Motor and sensation is grossly normal.  Cranial nerves are grossly intact. PSYCH:  Alert and oriented to person, place and time. Affect is normal.  Laboratory Analysis: Na 139, K 3.6, Cl 99, CO2 29, BUN 21, Cr 1.05.  Total bili  0.5, AST 22, ALT 17, Alk Phos 78.  WBC 17.4, Hgb 14.4, Hct 44.3, Plt 257.  Imaging: CTA abdomen/pelvis 10/10/20: IMPRESSION: VASCULAR  1. No emergent finding.  No aortic dissection or aneurysm. 2.  Aortic Atherosclerosis (ICD10-I70.0).  NON-VASCULAR  1. Colonic and small bowel diverticulosis. 2. Cholelithiasis without findings of cholecystitis.  Assessment and Plan: This is a 75 y.o. female with symptomatic cholelithiasis  --Discussed with the patient that she may be having symptomatic cholelithiasis.  Unclear if this is more related to the supraumbilical hernia repair she had in the past vs new issues from her gallbladder.  Her symptoms are the same as her brother had earlier and they resolved after cholecystectomy.  Discussed with her that we can offer minimally invasive surgery for her gallbladder in the form of robotic cholecystectomy.  Reviewed with her the surgery at length including the risks of bleeding, infection, injury to surrounding structures.  She's uncertain if she really wants surgery or not.  She would like to be able to eat whatever food she wants, but is not excited about the idea of surgery necessarily.  I discussed with her that at this point, there is no urgency for cholecystectomy and she can think about it, discuss with her family and give [place] a call.  If she wishes to proceed with surgery, she understands that we would also need medical clearance from her PCP and stop ASA 5 days prior to surgery in addition to a COVID-19 test prior to surgery.  If she would rather do watchful waiting, she can follow up as needed. --Patient will call us with her decision.  Face-to-face time spent with the patient and care providers was 45 minutes, with more than 50% of the time spent counseling, educating, and coordinating care of the patient.     Melvyn Neth, [facility]

## 2020-10-14 NOTE — Progress Notes (Deleted)
Established patient visit   Patient: Rebecca Patton   DOB: [birth year]   75 y.o. Female  MRN: [id number] Visit Date: 10/14/2020  Today's healthcare provider: [name], MD   No chief complaint on file.  Subjective    HPI  Follow up ER visit  Patient was seen in ER for abdominal pain on 10/10/2020. She was treated for generalized abdominal pain, calculus of gallbladder w/o cholecystitis w/o obstruction and Renal cyst. Treatment for this included no new medications. She reports {DESC; EXCELLENT/GOOD/FAIR:19992} compliance with treatment. She reports this condition is {improved/worse/unchanged:3041574}.  -----------------------------------------------------------------------------------------  Patient Active Problem List   Diagnosis Date Noted  . Incisional hernia, without obstruction or gangrene   . Supraumbilical hernia 17/61/6073  . Anxiety 03/20/2015  . Arthritis 03/20/2015  . 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 Clinical depression 03/20/2015  . Malignant neoplasm of corpus uteri, except isthmus ([place]) 03/20/2015  . Essential (primary) hypertension 03/20/2015  . HLD (hyperlipidemia) 03/20/2015  . Cannot sleep 03/20/2015  . Osteopenia 03/20/2015  . Adult hypothyroidism 03/20/2015   Past Surgical History:  Procedure Laterality Date  . ABDOMINAL HYSTERECTOMY  2009    BSO  . BUNIONECTOMY    . COLONOSCOPY WITH PROPOFOL N/A 01/23/2020   Procedure: COLONOSCOPY WITH PROPOFOL;  Surgeon: Toledo, Benay Pike, MD;  Location: [facility];  Service: Gastroenterology;  Laterality: N/A;  . EXCISION NEUROMA    . INSERTION OF MESH N/A 05/18/2018   Procedure: INSERTION OF MESH;  Surgeon: Vickie Epley, MD;  Location: [facility];  Service: General;  Laterality: N/A;  . NASAL SINUS SURGERY    . TUBAL LIGATION    . UMBILICAL HERNIA REPAIR N/A 05/18/2018   Procedure: HERNIA REPAIR SUPRA-UMBILICAL ADULT;  Surgeon: Vickie Epley, MD;  Location: [facility];  Service: General;   Laterality: N/A;   No Known Allergies     Medications: Outpatient Medications Prior to Visit  Medication Sig  . aspirin 81 MG tablet Take 81 mg by mouth.   . benzonatate (TESSALON) 100 MG capsule Take 1 capsule (100 mg total) by mouth 2 (two) times daily as needed for cough.  . fluticasone (FLONASE) 50 MCG/ACT nasal spray Place 1 spray into both nostrils daily.  . hydrochlorothiazide (HYDRODIURIL) 25 MG tablet TAKE 1 TABLET DAILY.  Marland Kitchen ibuprofen (ADVIL) 600 MG tablet Take 600 mg by mouth every 6 (six) hours as needed.  Marland Kitchen levocetirizine (XYZAL) 5 MG tablet Take 1 tablet (5 mg total) by mouth every evening.  Marland Kitchen levothyroxine (SYNTHROID) 50 MCG tablet TAKE 1 TABLET DAILY BEFORE BREAKFAST.  Marland Kitchen losartan (COZAAR) 100 MG tablet TAKE 1 TABLET(100 MG) BY MOUTH DAILY  . metoprolol succinate (TOPROL-XL) 25 MG 24 hr tablet TAKE 1 TABLET DAILY . TAKE WITH OR IMMEDIATELY        FOLLOWING A MEAL.  . MULTIPLE VITAMIN PO Take by mouth daily.   Marland Kitchen omeprazole (PRILOSEC) 20 MG capsule TAKE 1 CAPSULE TWICE DAILY BEFORE MEALS  . sertraline (ZOLOFT) 100 MG tablet Take 1 tablet (100 mg total) by mouth daily.  . vitamin B-12 (CYANOCOBALAMIN) 100 MCG tablet Take 200 mcg by mouth daily.   No facility-administered medications prior to visit.    Review of Systems  Last CBC Lab Results  Component Value Date   WBC 17.4 (H) 10/10/2020   HGB 14.4 10/10/2020   HCT 44.3 10/10/2020   MCV 88.8 10/10/2020   MCH 28.9 10/10/2020   RDW 13.6 10/10/2020   PLT 257 10/10/2020  Objective    There were no vitals taken for this visit. BP Readings from Last 3 Encounters:  10/10/20 (!) 154/59  07/21/20 (!) 130/58  01/23/20 (!) 99/39   Wt Readings from Last 3 Encounters:  10/10/20 200 lb (90.7 kg)  10/01/20 200 lb (90.7 kg)  07/21/20 207 lb (93.9 kg)      Physical Exam  ***  No results found for any visits on 10/14/20.  Assessment & Plan     ***  No follow-ups on file.      {provider  attestation***:1}   [name], MD  [facility] [phone] (phone) [phone] (fax)  [place]

## 2020-10-14 NOTE — Progress Notes (Signed)
10/14/2020  Reason for Visit:  Cholelithiasis  History of Present Illness: Rebecca Patton is a 75 y.o. female presenting for evaluation of cholelithiasis.  She had been doing well at home until Friday night 1/28, when she started having epigastric abdominal pain that was very severe.  Was not associated with nausea or vomiting, but it did radiate to her back.  Given the severity, she called EMS and was brought to the ER.  By the time she saw the ED provider, her pain has subsided for the most part.  Her laboratory workup was significant for an elevated WBC of 17.4, but her LFTs were normal.  She had a CTA abdomen/pelvis and there was no aortic dissection or aneurysm, and the only other main finding was cholelithiasis.  She has prior ultrasound showing cholelithiasis, and the CTA did not show any surrounding inflammation to suggest cholecystitis.  As the patient was feeling much better, she was discharged home.  She has kept a low fat diet, and has not had other issues in the past few days.  The patient reports that overall since her supraumbilical hernia surgery, she has had some mid-abdominal pain at the incision site.  It has been evaluated before with a CT scan previously, and there was no new hernia or recurrence.    Her brother had the same issue of abdominal pain and was found to have cholecystitis and had a cholecystectomy done.  He also did not have any nausea or vomiting.  Past Medical History: Past Medical History:  Diagnosis Date  . Arthritis   . Depression   . Dysrhythmia    ST  . High blood pressure   . Hypothyroidism   . MRSA infection 2006   on nose  . Uterine cancer ([place]) 2009     Past Surgical History: Past Surgical History:  Procedure Laterality Date  . ABDOMINAL HYSTERECTOMY  2009    BSO  . BUNIONECTOMY    . COLONOSCOPY WITH PROPOFOL N/A 01/23/2020   Procedure: COLONOSCOPY WITH PROPOFOL;  Surgeon: Toledo, Benay Pike, MD;  Location: [facility];  Service:  Gastroenterology;  Laterality: N/A;  . EXCISION NEUROMA    . INSERTION OF MESH N/A 05/18/2018   Procedure: INSERTION OF MESH;  Surgeon: Vickie Epley, MD;  Location: [facility];  Service: General;  Laterality: N/A;  . NASAL SINUS SURGERY    . TUBAL LIGATION    . UMBILICAL HERNIA REPAIR N/A 05/18/2018   Procedure: HERNIA REPAIR SUPRA-UMBILICAL ADULT;  Surgeon: Vickie Epley, MD;  Location: [facility] OR[facility];  Service: General;  Laterality: N/A;    Home Medications: Prior to Admission medications   Medication Sig Start Date End Date Taking? Authorizing Provider  aspirin 81 MG tablet Take 81 mg by mouth.    Yes [provider]  benzonatate (TESSALON) 100 MG capsule Take 1 capsule (100 mg total) by mouth 2 (two) times daily as needed for cough. 10/08/20  Yes Bacigalupo, Dionne Bucy, MD  fluticasone (FLONASE) 50 MCG/ACT nasal spray Place 1 spray into both nostrils daily. 04/30/20  Yes Jerrol Banana., MD  hydrochlorothiazide (HYDRODIURIL) 25 MG tablet TAKE 1 TABLET DAILY. 12/09/19  Yes Jerrol Banana., MD  levothyroxine (SYNTHROID) 50 MCG tablet TAKE 1 TABLET DAILY BEFORE BREAKFAST. 12/09/19  Yes Jerrol Banana., MD  losartan (COZAAR) 100 MG tablet TAKE 1 TABLET(100 MG) BY MOUTH DAILY 12/30/19  Yes Mar Daring, PA-C  metoprolol succinate (TOPROL-XL) 25 MG 24 hr tablet TAKE 1 TABLET  DAILY . TAKE WITH OR IMMEDIATELY        FOLLOWING A MEAL. 11/30/19  Yes Jerrol Banana., MD  MULTIPLE VITAMIN PO Take by mouth daily.    Yes [provider]  omeprazole (PRILOSEC) 20 MG capsule TAKE 1 CAPSULE TWICE DAILY BEFORE MEALS 07/21/20  Yes Jerrol Banana., MD  sertraline (ZOLOFT) 100 MG tablet Take 1 tablet (100 mg total) by mouth daily. 11/21/19  Yes Jerrol Banana., MD  vitamin B-12 (CYANOCOBALAMIN) 100 MCG tablet Take 200 mcg by mouth daily.   Yes [provider]    Allergies: No Known Allergies  Social History:  reports that she quit smoking  about 23 years ago. She has never used smokeless tobacco. She reports that she does not drink alcohol and does not use drugs.   Family History: Family History  Problem Relation Age of Onset  . Parkinson's disease Mother   . Diabetes Father   . CAD Father   . Cancer Sister        squamous cell cancer in her eye  . Pancreatic cancer Brother   . Tremor Brother   . Breast cancer Neg Hx     Review of Systems: Review of Systems  Constitutional: Negative for chills and fever.  Respiratory: Negative for shortness of breath.   Cardiovascular: Negative for chest pain.  Gastrointestinal: Positive for abdominal pain. Negative for constipation, diarrhea, nausea and vomiting.  Genitourinary: Negative for dysuria.  Musculoskeletal: Negative for myalgias.  Skin: Negative for rash.  Neurological: Negative for dizziness.  Psychiatric/Behavioral: Negative for depression.    Physical Exam There were no vitals taken for this visit. CONSTITUTIONAL: Temp 98.3, BP 151/72, O2 sat 97%, Height 5'5", Weight 203 lbs. HEENT:  Normocephalic, atraumatic, extraocular motion intact. NECK: Trachea is midline, and there is no jugular venous distension.  RESPIRATORY:  Normal respiratory effort without pathologic use of accessory muscles. CARDIOVASCULAR:  Regular rhythm and rate. GI: The abdomen is soft, non-distended, currently non-tender to palpation.  The supraumbilical hernia incision has healed well, with firm tissue beneath it consistent with scar tissue but no recurrence. Negative Murphy's sign. MUSCULOSKELETAL:  Normal muscle strength and tone in all four extremities.  No peripheral edema or cyanosis. SKIN: Skin turgor is normal. There are no pathologic skin lesions.  NEUROLOGIC:  Motor and sensation is grossly normal.  Cranial nerves are grossly intact. PSYCH:  Alert and oriented to person, place and time. Affect is normal.  Laboratory Analysis: Na 139, K 3.6, Cl 99, CO2 29, BUN 21, Cr 1.05.  Total bili  0.5, AST 22, ALT 17, Alk Phos 78.  WBC 17.4, Hgb 14.4, Hct 44.3, Plt 257.  Imaging: CTA abdomen/pelvis 10/10/20: IMPRESSION: VASCULAR  1. No emergent finding.  No aortic dissection or aneurysm. 2.  Aortic Atherosclerosis (ICD10-I70.0).  NON-VASCULAR  1. Colonic and small bowel diverticulosis. 2. Cholelithiasis without findings of cholecystitis.  Assessment and Plan: This is a 75 y.o. female with symptomatic cholelithiasis  --Discussed with the patient that she may be having symptomatic cholelithiasis.  Unclear if this is more related to the supraumbilical hernia repair she had in the past vs new issues from her gallbladder.  Her symptoms are the same as her brother had earlier and they resolved after cholecystectomy.  Discussed with her that we can offer minimally invasive surgery for her gallbladder in the form of robotic cholecystectomy.  Reviewed with her the surgery at length including the risks of bleeding, infection, injury to surrounding structures.  She's uncertain if she really wants surgery or not.  She would like to be able to eat whatever food she wants, but is not excited about the idea of surgery necessarily.  I discussed with her that at this point, there is no urgency for cholecystectomy and she can think about it, discuss with her family and give [place] a call.  If she wishes to proceed with surgery, she understands that we would also need medical clearance from her PCP and stop ASA 5 days prior to surgery in addition to a COVID-19 test prior to surgery.  If she would rather do watchful waiting, she can follow up as needed. --Patient will call us with her decision.  Face-to-face time spent with the patient and care providers was 45 minutes, with more than 50% of the time spent counseling, educating, and coordinating care of the patient.     Melvyn Neth, [facility]

## 2020-10-14 NOTE — Patient Instructions (Addendum)
If  you decide to move forward with surgery please call our office. I will send Medical Clearance to Dr.Gilbert incase you decide to move forward with surgery.   Our surgery scheduler will call you within 24-48 hours to schedule your surgery. Please have the [facility] surgery sheet available when speaking with her.     Cholelithiasis  Cholelithiasis happens when gallstones form in the gallbladder. The gallbladder stores bile. Bile is a fluid that helps digest fats. Bile can harden and form into gallstones. If they cause a blockage, they can cause pain (gallbladder attack). What are the causes? This condition may be caused by:  Some blood diseases, such as sickle cell anemia.  Too much of a fat-like substance (cholesterol) in your bile.  Not enough bile salts in your bile. These salts help the body absorb and digest fats.  The gallbladder not emptying fully or often enough. This is common in pregnant women. What increases the risk? The following factors may make you more likely to develop this condition:  Being female.  Being pregnant many times.  Eating a lot of fried foods, fat, and refined carbs (refined carbohydrates).  Being very overweight (obese).  Being older than age 56.  Using medicines with female hormones in them for a long time.  Losing weight fast.  Having gallstones in your family.  Having some health problems, such as diabetes, Crohn's disease, or liver disease. What are the signs or symptoms? Often, there may be gallstones but no symptoms. These gallstones are called silent gallstones. If a gallstone causes a blockage, you may get sudden pain. The pain:  Can be in the upper right part of your belly (abdomen).  Normally comes at night or after you eat.  Can last an hour or more.  Can spread to your right shoulder, back, or chest.  Can feel like discomfort, burning, or fullness in the upper part of your belly (indigestion). If the blockage lasts more than a  few hours, you can get an infection or swelling. You may:  Feel like you may vomit.  Vomit.  Feel bloated.  Have belly pain for 5 hours or more.  Feel tender in your belly, often in the upper right part and under your ribs.  Have fever or chills.  Have skin or the white parts of your eyes turn yellow (jaundice).  Have dark pee (urine) or pale poop (stool). How is this treated? Treatment for this condition depends on how bad you feel. If you have symptoms, you may need:  Home care, if symptoms are not very bad. ? Do not eat for 12-24 hours. Drink only water and clear liquids. ? Start to eat simple or clear foods after 1 or 2 days. Try broths and crackers. ? You may need medicines for pain or stomach upset or both. ? If you have an infection, you will need antibiotics.  A hospital stay, if you have very bad pain or a very bad infection.  Surgery to remove your gallbladder. You may need this if: ? Gallstones keep coming back. ? You have very bad symptoms.  Medicines to break up gallstones. Medicines: ? Are best for small gallstones. ? May be used for up to 6-12 months.  A procedure to find and take out gallstones or to break up gallstones. Follow these instructions at home: Medicines  Take over-the-counter and prescription medicines only as told by your doctor.  If you were prescribed an antibiotic medicine, take it as told by your doctor.  Do not stop taking the antibiotic even if you start to feel better.  Ask your doctor if the medicine prescribed to you requires you to avoid driving or using machinery. Eating and drinking  Drink enough fluid to keep your urine pale yellow. Drink water or clear fluids. This is important when you have pain.  Eat healthy foods. Choose: ? Fewer fatty foods, such as fried foods. ? Fewer refined carbs. Avoid breads and grains that are highly processed, such as white bread and white rice. Choose whole grains, such as whole-wheat bread and  brown rice. ? More fiber. Almonds, fresh fruit, and beans are healthy sources. General instructions  Keep a healthy weight.  Keep all follow-up visits as told by your doctor. This is important. Where to find more information  [employer] of Diabetes and Digestive and Kidney Diseases: [email or web address] Contact a doctor if:  You have sudden pain in the upper right part of your belly. Pain might spread to your right shoulder, back, or chest.  You have been diagnosed with gallstones that have no symptoms and you get: ? Belly pain. ? Discomfort, burning, or fullness in the upper part of your abdomen.  You have dark urine or pale stools. Get help right away if:  You have sudden pain in the upper right part of your abdomen, and the pain lasts more than 2 hours.  You have pain in your abdomen, and: ? It lasts more than 5 hours. ? It keeps getting worse.  You have a fever or chills.  You keep feeling like you may vomit.  You keep vomiting.  Your skin or the white parts of your eyes turn yellow. Summary  Cholelithiasis happens when gallstones form in the gallbladder.  This condition may be caused by a blood disease, too much of a fat-like substance in the bile, or not enough bile salts in bile.  Treatment for this condition depends on how bad you feel.  If you have symptoms, do not eat or drink. You may need medicines. You may need a hospital stay for very bad pain or a very bad infection.  You may need surgery if gallstones keep coming back or if you have very bad symptoms. This information is not intended to replace advice given to you by your health care provider. Make sure you discuss any questions you have with your health care provider. Document Revised: 10/18/2019 Document Reviewed: 07/22/2019 Elsevier Patient Education  2021 [place].  Gallbladder Eating Plan If you have a gallbladder condition, you may have trouble digesting fats. Eating a low-fat diet can  help reduce your symptoms, and may be helpful before and after having surgery to remove your gallbladder (cholecystectomy). Your health care provider may recommend that you work with a diet and nutrition specialist (dietitian) to help you reduce the amount of fat in your diet. What are tips for following this plan? General guidelines  Limit your fat intake to less than 30% of your total daily calories. If you eat around 1,800 calories each day, this is less than 60 grams (g) of fat per day.  Fat is an important part of a healthy diet. Eating a low-fat diet can make it hard to maintain a healthy body weight. Ask your dietitian how much fat, calories, and other nutrients you need each day.  Eat small, frequent meals throughout the day instead of three large meals.  Drink at least 8-10 cups of fluid a day. Drink enough fluid to keep your urine clear  or pale yellow.  Limit alcohol intake to no more than 1 drink a day for nonpregnant women and 2 drinks a day for men. One drink equals 12 oz of beer, 5 oz of wine, or 1 oz of hard liquor. Reading food labels  Check Nutrition Facts on food labels for the amount of fat per serving. Choose foods with less than 3 grams of fat per serving.   Shopping  Choose nonfat and low-fat healthy foods. Look for the words "nonfat," "low fat," or "fat free."  Avoid buying processed or prepackaged foods. Cooking  Cook using low-fat methods, such as baking, broiling, grilling, or boiling.  Cook with small amounts of healthy fats, such as olive oil, grapeseed oil, canola oil, or sunflower oil. What foods are recommended?  All fresh, frozen, or canned fruits and vegetables.  Whole grains.  Low-fat or non-fat (skim) milk and yogurt.  Lean meat, skinless poultry, fish, eggs, and beans.  Low-fat protein supplement powders or drinks.  Spices and herbs. What foods are not recommended?  High-fat foods. These include baked goods, fast food, fatty cuts of meat,  ice cream, french toast, sweet rolls, pizza, cheese bread, foods covered with butter, creamy sauces, or cheese.  Fried foods. These include french fries, tempura, battered fish, breaded chicken, fried breads, and sweets.  Foods with strong odors.  Foods that cause bloating and gas. Summary  A low-fat diet can be helpful if you have a gallbladder condition, or before and after gallbladder surgery.  Limit your fat intake to less than 30% of your total daily calories. This is about 60 g of fat if you eat 1,800 calories each day.  Eat small, frequent meals throughout the day instead of three large meals. This information is not intended to replace advice given to you by your health care provider. Make sure you discuss any questions you have with your health care provider. Document Revised: 04/16/2020 Document Reviewed: 04/16/2020 Elsevier Patient Education  2021 [employer].

## 2020-10-15 ENCOUNTER — Telehealth: Payer: Self-pay

## 2020-10-15 NOTE — Telephone Encounter (Signed)
Medical clearance has been faxed to Dr. Miguel Aschoff @ [phone].

## 2020-10-19 ENCOUNTER — Telehealth: Payer: Self-pay

## 2020-10-19 ENCOUNTER — Telehealth: Payer: Self-pay | Admitting: Surgery

## 2020-10-19 NOTE — Telephone Encounter (Signed)
Spoke with Linwood Dibbles at Dr. Alben Spittle office, and she informed me that she called pt to schedule an appt with pt to come in this week. Pt states that she cannot come in this week and it would have to be Monday Feb 14 due to her job. Her surgery is scheduled for 02/15 and does not know if that will be enough time to get her medical clearance done. Please advise.    Per Dr. Hampton Abbot:  As a precaution, we can move her surgery to 2/17. That way we have the time for her to be seen and [place] to get the clearance form back. If there are any tests that he needs to do, then we may need to postpone further.    If she's ok with changing to 2/17, can you let Pamala Hurry know so she can make the change too?    Pt notified and she did not want to r/s her surgery because everything has been worked out to have her surgery on the Feb 15. Pt states she will contact Dr. Alben Spittle office and see if she can possibly get in this week so she can keep her surgery date of Feb 15.

## 2020-10-19 NOTE — Telephone Encounter (Signed)
Apt scheduled with Adriana 10/23/2020 at 11am.   Thanks,   -Mickel Baas

## 2020-10-19 NOTE — Telephone Encounter (Signed)
Patient called again to find out what the status was on this

## 2020-10-19 NOTE — Telephone Encounter (Signed)
Patient has appt w/ Adriana on 10/23/20 for surgical clearance.

## 2020-10-19 NOTE — Addendum Note (Signed)
Addended by: Riki Sheer on: 10/19/2020 11:02 AM   Modules accepted: Orders, SmartSet

## 2020-10-19 NOTE — Telephone Encounter (Signed)
Please advise 

## 2020-10-19 NOTE — Telephone Encounter (Signed)
Freda Munro with  [facility] called to inquire if it is ok with Dr Rosanna Randy for patient to stop her Aspirin for 5 days before her procedure on 10/27/20 asking if she can stop the Aspirin on 10/22/20. Please call Freda Munro  Ph# [phone]

## 2020-10-19 NOTE — Telephone Encounter (Signed)
Pt has been advised of Pre-Admission date/time, COVID Testing date and Surgery date.  Surgery Date: 10/27/20 Preadmission Testing Date: 10/22/20 (phone 8a-1p) Covid Testing Date: 10/23/20 - patient advised to go to the [facility] ([place]) between 8a-1p  Patient has also been informed her last aspirin 10/21/20 per Dr. Hampton Abbot    Patient has been made aware to call [phone], between 1-3:00pm the day before surgery, to find out what time to arrive for surgery.

## 2020-10-19 NOTE — Telephone Encounter (Signed)
Copied from [place] [id number]. Topic: General - Other >> Oct 19, 2020 10:27 AM Celene Kras wrote: Reason for CRM: Pt called stating that [facility] sent over a fax on 10/15/20 regarding a surgical clearance. She states that she is needing an update. Please advise.

## 2020-10-19 NOTE — Telephone Encounter (Signed)
Copied from [place] [id number]. Topic: General - Other >> Oct 19, 2020 10:27 AM Celene Kras wrote: Reason for CRM: Pt called stating that [facility] sent over a fax on 10/15/20 regarding a surgical clearance. She states that she is needing an update. Please advise. >> Oct 19, 2020  2:32 PM Keene Breath wrote: Patient is calling to speak with the assistant regarding her surgery.  Please call patient back as soon as possible.

## 2020-10-20 NOTE — Telephone Encounter (Signed)
Ok to stop aspirin

## 2020-10-20 NOTE — Progress Notes (Signed)
Patient had appointment at the same time with surgery for gallstones.  I will see her back for routine follow-up or as needed.

## 2020-10-20 NOTE — Telephone Encounter (Signed)
Rebecca Patton was advised.  

## 2020-10-22 ENCOUNTER — Telehealth: Payer: Self-pay

## 2020-10-22 ENCOUNTER — Encounter
Admission: RE | Admit: 2020-10-22 | Discharge: 2020-10-22 | Disposition: A | Discharge status: Home / Self Care | Payer: Medicare HMO | Source: Ambulatory Visit | Attending: Surgery | Admitting: Surgery

## 2020-10-22 ENCOUNTER — Other Ambulatory Visit: Payer: Self-pay

## 2020-10-22 DIAGNOSIS — Z01812 Encounter for preprocedural laboratory examination: Secondary | ICD-10-CM | POA: Insufficient documentation

## 2020-10-22 DIAGNOSIS — Z20822 Contact with and (suspected) exposure to covid-19: Secondary | ICD-10-CM | POA: Diagnosis not present

## 2020-10-22 HISTORY — DX: Gastro-esophageal reflux disease without esophagitis: K21.9

## 2020-10-22 NOTE — Patient Instructions (Addendum)
Your procedure is scheduled on:10-27-20 TUESDAY Report to the Registration Desk on the 1st floor of the [facility]-Then proceed to the 2nd floor Surgery Desk in the [facility] To find out your arrival time, please call [phone] between 1PM - 3PM on:10-26-20 MONDAY  REMEMBER: Instructions that are not followed completely may result in serious medical risk, up to and including death; or upon the discretion of your surgeon and anesthesiologist your surgery may need to be rescheduled.  Do not eat food after midnight the night before surgery.  No gum chewing, lozengers or hard candies.  You may however, drink CLEAR liquids up to 2 hours before you are scheduled to arrive for your surgery. Do not drink anything within 2 hours of your scheduled arrival time.  Clear liquids include: - water  - apple juice without pulp - gatorade - black coffee or tea (Do NOT add milk or creamers to the coffee or tea) Do NOT drink anything that is not on this list..  TAKE THESE MEDICATIONS THE MORNING OF SURGERY WITH A SIP OF WATER: -SYNTHROID (LOVETHYROXINE) -METOPROLOL (TOPROL) -PRILOSEC (OMEPRAZOLE)  Follow recommendations from Cardiologist, Pulmonologist or PCP regarding stopping Aspirin, Coumadin, Plavix, Eliquis, Pradaxa, or Pletal-81 MG ASPIRIN WAS STOPPED ON 10-18-20 SUNDAY   One week prior to surgery: Stop Anti-inflammatories (NSAIDS) such as Advil, Aleve, Ibuprofen, Motrin, Naproxen, Naprosyn and Aspirin based products such as Excedrin, Goodys Powder, BC Powder-OK TO TAKE TYLENOL IF NEEDED  Stop ANY OVER THE COUNTER supplements until after surgery-STOP YOUR MELATONIN AND QUERCETIN COMPLEX IMMUNE NOW-YOU MAY RESUME AFTER SURGERY (However, you may continue taking Zinc, Vitamin B12 and multivitamin up until the day before surgery.)  No Alcohol for 24 hours before or after surgery.  No Smoking including e-cigarettes for 24 hours prior to surgery.  No chewable tobacco products for at least 6  hours prior to surgery.  No nicotine patches on the day of surgery.  Do not use any "recreational" drugs for at least a week prior to your surgery.  Please be advised that the combination of cocaine and anesthesia may have negative outcomes, up to and including death. If you test positive for cocaine, your surgery will be cancelled.  On the morning of surgery brush your teeth with toothpaste and water, you may rinse your mouth with mouthwash if you wish. Do not swallow any toothpaste or mouthwash.  Do not wear jewelry, make-up, hairpins, clips or nail polish.  Do not wear lotions, powders, or perfumes.   Do not shave body from the neck down 48 hours prior to surgery just in case you cut yourself which could leave a site for infection.  Also, freshly shaved skin may become irritated if using the CHG soap.  Contact lenses, hearing aids and dentures may not be worn into surgery.  Do not bring valuables to the hospital. [facility] is not responsible for any missing/lost belongings or valuables.   Use CHG Soap as directed on instruction sheet.  Notify your doctor if there is any change in your medical condition (cold, fever, infection).  Wear comfortable clothing (specific to your surgery type) to the hospital.  Plan for stool softeners for home use; pain medications have a tendency to cause constipation. You can also help prevent constipation by eating foods high in fiber such as fruits and vegetables and drinking plenty of fluids as your diet allows.  After surgery, you can help prevent lung complications by doing breathing exercises.  Take deep breaths and cough every 1-2  hours. Your doctor may order a device called an Incentive Spirometer to help you take deep breaths. When coughing or sneezing, hold a pillow firmly against your incision with both hands. This is called "splinting." Doing this helps protect your incision. It also decreases belly discomfort.  If you are being admitted  to the hospital overnight, leave your suitcase in the car. After surgery it may be brought to your room.  If you are being discharged the day of surgery, you will not be allowed to drive home. You will need a responsible adult (18 years or older) to drive you home and stay with you that night.   If you are taking public transportation, you will need to have a responsible adult (18 years or older) with you. Please confirm with your physician that it is acceptable to use public transportation.   Please call the [facility] at [phone] if you have any questions about these instructions.  Visitation Policy:  Patients undergoing a surgery or procedure may have one family member or support person with them as long as that person is not COVID-19 positive or experiencing its symptoms.  That person may remain in the waiting area during the procedure.  Inpatient Visitation:    Visiting hours are 7 a.m. to 8 p.m. Patients will be allowed one visitor. The visitor may change daily. The visitor must pass COVID-19 screenings, use hand sanitizer when entering and exiting the patient's room and wear a mask at all times, including in the patient's room. Patients must also wear a mask when staff or their visitor are in the room. Masking is required regardless of vaccination status. Systemwide, no visitors 17 or younger.

## 2020-10-22 NOTE — Telephone Encounter (Signed)
Advised patient that she still needs an appt to be medically cleared for surgery even though EKG, labs, etc is not needed. Form given to Adriana's nurse. She has an appt with her tomorrow to have this done.

## 2020-10-22 NOTE — Telephone Encounter (Signed)
Copied from [place]. Topic: General - Call Back - No Documentation >> Oct 22, 2020 12:27 PM Erick Blinks wrote: Pt states that her surgeon needs a medical release form, she states that she spoke to the surgeon's admissions office and was told that she did not have to have a new EKG with her PCP for this because she has already had two within the last two weeks. Please advise,   Pt wants to know if she needs to come in to receive from PCP or if he is willing to sign without EKG at office.   Best contact: [phone]

## 2020-10-23 ENCOUNTER — Other Ambulatory Visit
Admission: RE | Admit: 2020-10-23 | Discharge: 2020-10-23 | Disposition: A | Discharge status: Home / Self Care | Payer: Medicare HMO | Source: Ambulatory Visit | Attending: Surgery | Admitting: Surgery

## 2020-10-23 ENCOUNTER — Encounter: Payer: Self-pay | Admitting: Physician Assistant

## 2020-10-23 ENCOUNTER — Ambulatory Visit (INDEPENDENT_AMBULATORY_CARE_PROVIDER_SITE_OTHER): Payer: Medicare HMO | Admitting: Physician Assistant

## 2020-10-23 ENCOUNTER — Other Ambulatory Visit: Payer: Self-pay

## 2020-10-23 VITALS — BP 139/63 | HR 61 | Temp 98.4°F | Ht 66.0 in | Wt 203.8 lb

## 2020-10-23 DIAGNOSIS — Z20822 Contact with and (suspected) exposure to covid-19: Secondary | ICD-10-CM | POA: Diagnosis not present

## 2020-10-23 DIAGNOSIS — K8080 Other cholelithiasis without obstruction: Secondary | ICD-10-CM | POA: Diagnosis not present

## 2020-10-23 DIAGNOSIS — Z01818 Encounter for other preprocedural examination: Secondary | ICD-10-CM | POA: Diagnosis not present

## 2020-10-23 DIAGNOSIS — Z01812 Encounter for preprocedural laboratory examination: Secondary | ICD-10-CM | POA: Diagnosis not present

## 2020-10-23 NOTE — Patient Instructions (Signed)
Minimally Invasive Cholecystectomy, Care After This sheet gives you information about how to care for yourself after your procedure. Your doctor may also give you more specific instructions. If you have problems or questions, contact your doctor. What can I expect after the procedure? After the procedure, it is common:  To have pain at the areas of surgery. You will be given medicines for pain.  To vomit or feel like you may vomit.  To feel fullness in the belly (bloating) or to have pain in the shoulder. This comes from the gas that was used during the surgery. Follow these instructions at home: Medicines  Take over-the-counter and prescription medicines only as told by your doctor.  If you were prescribed an antibiotic medicine, take it as told by your doctor. Do not stop using the antibiotic even if you start to feel better.  Ask your doctor if the medicine prescribed to you: ? Requires you to avoid driving or using machinery. ? Can cause trouble pooping (constipation). You may need to take these actions to prevent or treat trouble pooping:  Drink enough fluid to keep your pee (urine) pale yellow.  Take over-the-counter or prescription medicines.  Eat foods that are high in fiber. These include beans, whole grains, and fresh fruits and vegetables.  Limit foods that are high in fat and sugar. These include fried or sweet foods. Incision care  Follow instructions from your doctor about how to take care of your cuts from surgery (incisions). Make sure you: ? Wash your hands with soap and water for at least 20 seconds before and after you change your bandage (dressing). If you cannot use soap and water, use hand sanitizer. ? Change your bandage as told by your doctor. ? Leave stitches (sutures), skin glue, or skin tape (adhesive) strips in place. They may need to stay in place for 2 weeks or longer. If tape strips get loose and curl up, you may trim the loose edges. Do not remove tape  strips completely unless your doctor says it is okay.  Do not take baths, swim, or use a hot tub until your doctor approves. Ask your doctor if you may take showers. You may only be allowed to take sponge baths.  Check your surgery area every day for signs of infection. Check for: ? More redness, swelling, or pain. ? Fluid or blood. ? Warmth. ? Pus or a bad smell.   Activity  Rest as told by your doctor.  Do not sit for a long time without moving. Get up to take short walks every 1-2 hours. This is important. Ask for help if you feel weak or unsteady.  Do not lift anything that is heavier than 10 lb (4.5 kg), or the limit that you are told, until your doctor says that it is safe.  Do not play contact sports until your doctor says it is okay.  Do not return to work or school until your doctor says it is okay.  Return to your normal activities as told by your doctor. Ask your doctor what activities are safe for you. General instructions  If you were given a medicine to help you relax (sedative) during your procedure, it can affect you for many hours. Do not drive or use machinery until your doctor says that it is safe.  Keep all follow-up visits as told by your doctor. This is important. Contact a doctor if:  You get a rash.  You have more redness, swelling, or pain around  your cuts from surgery.  You have fluid or blood coming from your cuts from surgery.  Your cuts from surgery feel warm to the touch.  You have pus or a bad smell coming from your cuts from surgery.  You have a fever.  One or more of your cuts from surgery breaks open. Get help right away if:  You have trouble breathing.  You have chest pain.  You have pain that is getting worse in your shoulders.  You faint or feel dizzy when you stand.  You have very bad pain in your belly (abdomen).  You feel like you may vomit or you vomit, and this lasts for more than one day.  You have leg  pain. Summary  After your surgery, it is common to have pain at the areas of surgery. You may also have vomiting or fullness in the belly.  Follow your doctor's instructions about medicine, activity restrictions, and caring for your surgery areas. Do not do activities that require a lot of effort.  Contact a doctor if you have a fever or other signs of infection, such as more redness, swelling, or pain around the cuts from surgery.  Get help right away if you have chest pain, increasing pain in the shoulders, or trouble breathing. This information is not intended to replace advice given to you by your health care provider. Make sure you discuss any questions you have with your health care provider. Document Revised: 08/13/2019 Document Reviewed: 08/13/2019 Elsevier Patient Education  2021 [employer].

## 2020-10-23 NOTE — Progress Notes (Signed)
Established patient visit   Patient: Rebecca Patton   DOB: [birth year]   75 y.o. Female  MRN: [id number] Visit Date: 10/23/2020  Today's healthcare provider: Trinna Post, PA-C   Chief Complaint  Patient presents with  . Surgical Clearance  I,Takenya Travaglini M [place] Joshi,acting as a scribe for Trinna Post, PA-C.,have documented all relevant documentation on the behalf of Trinna Post, PA-C,as directed by  Trinna Post, PA-C while in the presence of Trinna Post, PA-C.  Subjective    HPI  Surgical Clearance Patient presents today for surgical clearance for gallbladder removal on 10/27/2020. She has a history of hypertension, hypothyroidism, and depression. She had covid in mid January but has since recovered well. She does not have any shortness of breath or respiratory symptoms. She does not use a CPAP or have sleep apnea. She has well controlled HTN. She does not have a history of DM. She takes a baby aspirin daily which she has stopped for a week at this point. She has no history of negative reactions to anesthesia. She has undergone surgery prior without issue. She is not using alcohol. She has a recent EKG from 10/10/2020.   Lab Results  Component Value Date   WBC 17.4 (H) 10/10/2020   HGB 14.4 10/10/2020   HCT 44.3 10/10/2020   MCV 88.8 10/10/2020   PLT 257 10/10/2020   CMP Latest Ref Rng & Units 10/10/2020 10/17/2019 08/27/2019  Glucose 70 - 99 mg/dL 148(H) 88 -  BUN 8 - 23 mg/dL 21 15 -  Creatinine 0.44 - 1.00 mg/dL 1.05(H) 0.92 0.80  Sodium 135 - 145 mmol/L 139 141 -  Potassium 3.5 - 5.1 mmol/L 3.6 3.9 -  Chloride 98 - 111 mmol/L 99 102 -  CO2 22 - 32 mmol/L 29 23 -  Calcium 8.9 - 10.3 mg/dL 9.5 9.3 -  Total Protein 6.5 - 8.1 g/dL 7.8 6.5 -  Total Bilirubin 0.3 - 1.2 mg/dL 0.5 0.3 -  Alkaline Phos 38 - 126 U/L 78 75 -  AST 15 - 41 U/L 22 26 -  ALT 0 - 44 U/L 17 25 -       Medications: Outpatient Medications Prior to Visit  Medication Sig  . aspirin 81  MG tablet Take 81 mg by mouth every 3 (three) days.  . benzonatate (TESSALON) 100 MG capsule Take 1 capsule (100 mg total) by mouth 2 (two) times daily as needed for cough.  . Bioflavonoid Products (QUERCETIN COMPLEX IMMUNE) CAPS Take 1 tablet by mouth daily.  . fluticasone (FLONASE) 50 MCG/ACT nasal spray Place 1 spray into both nostrils daily.  . Glycerin-Polysorbate 80 (REFRESH DRY EYE THERAPY OP) Place 1 drop into both eyes daily.  . hydrochlorothiazide (HYDRODIURIL) 25 MG tablet TAKE 1 TABLET DAILY. (Patient taking differently: Take 25 mg by mouth daily.)  . levothyroxine (SYNTHROID) 50 MCG tablet TAKE 1 TABLET DAILY BEFORE BREAKFAST. (Patient taking differently: Take 50 mcg by mouth daily before breakfast.)  . losartan (COZAAR) 100 MG tablet TAKE 1 TABLET(100 MG) BY MOUTH DAILY (Patient taking differently: Take 100 mg by mouth at bedtime.)  . melatonin 5 MG TABS Take 5 mg by mouth daily.  . metoprolol succinate (TOPROL-XL) 25 MG 24 hr tablet TAKE 1 TABLET DAILY . TAKE WITH OR IMMEDIATELY        FOLLOWING A MEAL. (Patient taking differently: Take 25 mg by mouth every morning.)  . MULTIPLE VITAMIN PO Take 1 tablet by mouth daily.  Centrum Silver  . omeprazole (PRILOSEC) 20 MG capsule TAKE 1 CAPSULE TWICE DAILY BEFORE MEALS (Patient taking differently: Take 20 mg by mouth 2 (two) times daily before a meal.)  . sertraline (ZOLOFT) 100 MG tablet Take 1 tablet (100 mg total) by mouth daily. (Patient taking differently: Take 100 mg by mouth at bedtime.)  . vitamin B-12 (CYANOCOBALAMIN) 100 MCG tablet Take 200 mcg by mouth daily.  Marland Kitchen zinc gluconate 50 MG tablet Take 50 mg by mouth daily.   No facility-administered medications prior to visit.    Review of Systems  Constitutional: Negative.   Respiratory: Negative.   Hematological: Negative.        Objective    BP 139/63 (BP Location: Left Arm, Patient Position: Sitting, Cuff Size: Large)   Pulse 61   Temp 98.4 F (36.9 C) (Oral)   Ht 5\' 6"   (1.676 m)   Wt 203 lb 12.8 oz (92.4 kg)   SpO2 97%   BMI 32.89 kg/m     Physical Exam Constitutional:      Appearance: Normal appearance.  Skin:    General: Skin is warm and dry.  Neurological:     General: No focal deficit present.     Mental Status: She is alert and oriented to person, place, and time.  Psychiatric:        Mood and Affect: Mood normal.        Behavior: Behavior normal.       No results found for any visits on 10/23/20.  Assessment & Plan    1. Pre-op exam  Patient declines labs today. She reports she spoke with surgical coordinator who stated they had everything they need. Labs largely normal except for leukocytosis on 10/10/2020 which is very likely associated with her cholecystitis. Otherwise, Runner, broadcasting/film/video of Surgical Risk calculator demonstrates low risk for this patient.    - CBC with Differential - Comprehensive Metabolic Panel (CMET) - HgB A1c  2. Biliary calculus of other site without obstruction    Return if symptoms worsen or fail to improve.      ITrinna Post, PA-C, have reviewed all documentation for this visit. The documentation on 10/23/20 for the exam, diagnosis, procedures, and orders are all accurate and complete.  The entirety of the information documented in the History of Present Illness, Review of Systems and Physical Exam were personally obtained by me. Portions of this information were initially documented by [facility] and reviewed by me for thoroughness and accuracy.   I spent 30 minutes dedicated to the care of this patient on the date of this encounter to include pre-visit review of records, face-to-face time with the patient discussing surgical clearance, and post visit ordering of testing.    Paulene Floor  [facility] [phone] (phone) [phone] (fax)  [place]

## 2020-10-24 LAB — SARS CORONAVIRUS 2 (TAT 6-24 HRS): SARS Coronavirus 2: POSITIVE — AB

## 2020-10-25 ENCOUNTER — Telehealth: Payer: Self-pay | Admitting: Nurse Practitioner

## 2020-10-25 NOTE — Telephone Encounter (Signed)
Called patient to discuss Covid symptoms and the use of sotrovimab, a monoclonal antibody infusion, or antiviral medication therapy for those with mild to moderate Covid symptoms and at a high risk of hospitalization.  Pt may be qualified for treatment due to; Specific high risk criteria : Older age (>/= 73 yo) and Cardiovascular disease or hypertension   Message left to call back our hotline [phone].  Murray Hodgkins, NP

## 2020-10-26 ENCOUNTER — Telehealth: Payer: Self-pay | Admitting: Surgery

## 2020-10-26 NOTE — Telephone Encounter (Signed)
Updated information regarding rescheduled surgery.  Patient tested positive on 10/23/20 with her Covid test.  Patient states that she was positive 1st of January 2022, as she was around a lot of people who tested positive.  Said she was told that she probably had Covid as well, BUT patient never was tested, therefore no official documentation of her actually testing positive for Covid.  Patient upset that we have to reschedule, but she understands that we have to follow the guidelines.      Patient has been advised of Pre-Admission date/time, COVID Testing date and Surgery date.  Surgery Date: 11/05/20 Preadmission Testing Date: 10/22/20 (phone already done) Covid Testing Date: 10/23/20, done and positive for Covid.    Patient has been made aware to call [phone], between 1-3:00pm the day before surgery, to find out what time to arrive for surgery.

## 2020-10-26 NOTE — Progress Notes (Signed)
Medical Clearance has been received from Dr Alben Spittle office. The patient is cleared for surgery at Low risk.

## 2020-10-27 ENCOUNTER — Telehealth: Payer: Self-pay | Admitting: Family Medicine

## 2020-10-27 DIAGNOSIS — F3340 Major depressive disorder, recurrent, in remission, unspecified: Secondary | ICD-10-CM

## 2020-10-27 DIAGNOSIS — I10 Essential (primary) hypertension: Secondary | ICD-10-CM

## 2020-10-27 MED ORDER — LEVOTHYROXINE SODIUM 50 MCG PO TABS: 50.0000 ug | ORAL_TABLET | Freq: Every day | ORAL | 3 refills | Status: DC

## 2020-10-27 MED ORDER — HYDROCHLOROTHIAZIDE 25 MG PO TABS: 25.0000 mg | ORAL_TABLET | Freq: Every day | ORAL | 3 refills | Status: DC

## 2020-10-27 MED ORDER — METOPROLOL SUCCINATE ER 25 MG PO TB24: ORAL_TABLET | ORAL | 3 refills | Status: DC

## 2020-10-27 MED ORDER — SERTRALINE HCL 100 MG PO TABS: 100.0000 mg | ORAL_TABLET | Freq: Every day | ORAL | 3 refills | Status: DC

## 2020-10-27 NOTE — Telephone Encounter (Addendum)
[facility] faxed refill request for the following medications:  1. hydrochlorothiazide (HYDRODIURIL) 25 MG tablet 2. levothyroxine (SYNTHROID) 50 MCG tablet 3. metoprolol succinate (TOPROL-XL) 25 MG 24 hr tablet 4. sertraline (ZOLOFT) 100 MG tablet  Last Rx: 12/09/19 LOV: 10/14/20 with Dr. Rosanna Randy and 10/23/20 with Adriana Please advise. Thanks TNP

## 2020-11-05 ENCOUNTER — Ambulatory Visit: Payer: Medicare HMO | Admitting: Urgent Care

## 2020-11-05 ENCOUNTER — Encounter: Payer: Self-pay | Admitting: Surgery

## 2020-11-05 ENCOUNTER — Ambulatory Visit
Admission: RE | Admit: 2020-11-05 | Discharge: 2020-11-05 | Disposition: A | Discharge status: Home / Self Care | Payer: Medicare HMO | Attending: Surgery | Admitting: Surgery

## 2020-11-05 ENCOUNTER — Encounter
Admission: RE | Disposition: A | Discharge status: Home / Self Care | Payer: Self-pay | Source: Home / Self Care | Attending: Surgery

## 2020-11-05 ENCOUNTER — Other Ambulatory Visit: Payer: Self-pay

## 2020-11-05 DIAGNOSIS — K8020 Calculus of gallbladder without cholecystitis without obstruction: Secondary | ICD-10-CM | POA: Insufficient documentation

## 2020-11-05 DIAGNOSIS — Z8616 Personal history of COVID-19: Secondary | ICD-10-CM | POA: Insufficient documentation

## 2020-11-05 DIAGNOSIS — K8010 Calculus of gallbladder with chronic cholecystitis without obstruction: Secondary | ICD-10-CM | POA: Diagnosis not present

## 2020-11-05 DIAGNOSIS — Z7982 Long term (current) use of aspirin: Secondary | ICD-10-CM | POA: Insufficient documentation

## 2020-11-05 DIAGNOSIS — Z79899 Other long term (current) drug therapy: Secondary | ICD-10-CM | POA: Insufficient documentation

## 2020-11-05 DIAGNOSIS — Z8542 Personal history of malignant neoplasm of other parts of uterus: Secondary | ICD-10-CM | POA: Diagnosis not present

## 2020-11-05 DIAGNOSIS — K8080 Other cholelithiasis without obstruction: Secondary | ICD-10-CM | POA: Diagnosis not present

## 2020-11-05 DIAGNOSIS — Z79890 Hormone replacement therapy (postmenopausal): Secondary | ICD-10-CM | POA: Insufficient documentation

## 2020-11-05 DIAGNOSIS — Z87891 Personal history of nicotine dependence: Secondary | ICD-10-CM | POA: Diagnosis not present

## 2020-11-05 SURGERY — CHOLECYSTECTOMY, ROBOT-ASSISTED, LAPAROSCOPIC
Anesthesia: General

## 2020-11-05 MED ORDER — FENTANYL CITRATE (PF) 100 MCG/2ML IJ SOLN
INTRAMUSCULAR | Status: AC
  Filled 2020-11-05: qty 2

## 2020-11-05 MED ORDER — BUPIVACAINE-EPINEPHRINE (PF) 0.25% -1:200000 IJ SOLN
INTRAMUSCULAR | Status: AC
  Filled 2020-11-05: qty 30

## 2020-11-05 MED ORDER — HYDROCODONE-ACETAMINOPHEN 7.5-325 MG PO TABS
1.0000 | ORAL_TABLET | Freq: Once | ORAL | Status: AC | PRN
  Administered 2020-11-05: 1 via ORAL

## 2020-11-05 MED ORDER — CEFAZOLIN SODIUM-DEXTROSE 2-4 GM/100ML-% IV SOLN
INTRAVENOUS | Status: AC
  Filled 2020-11-05: qty 100

## 2020-11-05 MED ORDER — FENTANYL CITRATE (PF) 100 MCG/2ML IJ SOLN
INTRAMUSCULAR | Status: AC
  Administered 2020-11-05: 25 ug via INTRAVENOUS
  Filled 2020-11-05: qty 2

## 2020-11-05 MED ORDER — PROPOFOL 10 MG/ML IV BOLUS
INTRAVENOUS | Status: DC | PRN
  Administered 2020-11-05: 150 mg via INTRAVENOUS

## 2020-11-05 MED ORDER — SIMETHICONE 80 MG PO CHEW
80.0000 mg | CHEWABLE_TABLET | Freq: Once | ORAL | Status: AC
  Administered 2020-11-05: 80 mg via ORAL
  Filled 2020-11-05 (×2): qty 1

## 2020-11-05 MED ORDER — FENTANYL CITRATE (PF) 100 MCG/2ML IJ SOLN
25.0000 ug | INTRAMUSCULAR | Status: DC | PRN
Start: 2020-11-05 — End: 2020-11-05
  Administered 2020-11-05: 25 ug via INTRAVENOUS

## 2020-11-05 MED ORDER — HYDROCODONE-ACETAMINOPHEN 7.5-325 MG PO TABS
ORAL_TABLET | ORAL | Status: AC
  Filled 2020-11-05: qty 1

## 2020-11-05 MED ORDER — SUGAMMADEX SODIUM 200 MG/2ML IV SOLN
INTRAVENOUS | Status: DC | PRN
  Administered 2020-11-05: 200 mg via INTRAVENOUS

## 2020-11-05 MED ORDER — INDOCYANINE GREEN 25 MG IV SOLR
2.5000 mg | INTRAVENOUS | Status: AC
  Administered 2020-11-05: 2.5 mg via INTRAVENOUS
  Filled 2020-11-05: qty 1

## 2020-11-05 MED ORDER — GLYCOPYRROLATE 0.2 MG/ML IJ SOLN
INTRAMUSCULAR | Status: DC | PRN
  Administered 2020-11-05: .2 mg via INTRAVENOUS

## 2020-11-05 MED ORDER — CEFAZOLIN SODIUM-DEXTROSE 2-4 GM/100ML-% IV SOLN
2.0000 g | INTRAVENOUS | Status: AC
  Administered 2020-11-05: 2 g via INTRAVENOUS

## 2020-11-05 MED ORDER — ROCURONIUM BROMIDE 100 MG/10ML IV SOLN
INTRAVENOUS | Status: DC | PRN
  Administered 2020-11-05: 10 mg via INTRAVENOUS
  Administered 2020-11-05: 50 mg via INTRAVENOUS
  Administered 2020-11-05: 10 mg via INTRAVENOUS

## 2020-11-05 MED ORDER — IBUPROFEN 600 MG PO TABS: 600.0000 mg | ORAL_TABLET | Freq: Three times a day (TID) | ORAL | 1 refills | Status: DC | PRN

## 2020-11-05 MED ORDER — BUPIVACAINE-EPINEPHRINE (PF) 0.25% -1:200000 IJ SOLN
INTRAMUSCULAR | Status: DC | PRN
  Administered 2020-11-05: 30 mL

## 2020-11-05 MED ORDER — ACETAMINOPHEN 500 MG PO TABS: 1000.0000 mg | ORAL_TABLET | ORAL | Status: AC

## 2020-11-05 MED ORDER — DEXAMETHASONE SODIUM PHOSPHATE 10 MG/ML IJ SOLN
INTRAMUSCULAR | Status: DC | PRN
  Administered 2020-11-05: 10 mg via INTRAVENOUS

## 2020-11-05 MED ORDER — ACETAMINOPHEN 500 MG PO TABS
ORAL_TABLET | ORAL | Status: AC
  Administered 2020-11-05: 1000 mg via ORAL
  Filled 2020-11-05: qty 2

## 2020-11-05 MED ORDER — ROCURONIUM BROMIDE 10 MG/ML (PF) SYRINGE
PREFILLED_SYRINGE | INTRAVENOUS | Status: AC
  Filled 2020-11-05: qty 10

## 2020-11-05 MED ORDER — DEXAMETHASONE SODIUM PHOSPHATE 10 MG/ML IJ SOLN
INTRAMUSCULAR | Status: AC
  Filled 2020-11-05: qty 1

## 2020-11-05 MED ORDER — CHLORHEXIDINE GLUCONATE 0.12 % MT SOLN: 15.0000 mL | Freq: Once | OROMUCOSAL | Status: AC

## 2020-11-05 MED ORDER — GLYCOPYRROLATE 0.2 MG/ML IJ SOLN
INTRAMUSCULAR | Status: AC
  Filled 2020-11-05: qty 1

## 2020-11-05 MED ORDER — OXYCODONE HCL 5 MG PO TABS: 5.0000 mg | ORAL_TABLET | ORAL | 0 refills | Status: DC | PRN

## 2020-11-05 MED ORDER — ONDANSETRON HCL 4 MG/2ML IJ SOLN
INTRAMUSCULAR | Status: DC | PRN
  Administered 2020-11-05: 4 mg via INTRAVENOUS

## 2020-11-05 MED ORDER — LIDOCAINE HCL (CARDIAC) PF 100 MG/5ML IV SOSY
PREFILLED_SYRINGE | INTRAVENOUS | Status: DC | PRN
  Administered 2020-11-05: 100 mg via INTRAVENOUS

## 2020-11-05 MED ORDER — ACETAMINOPHEN 160 MG/5ML PO SOLN
325.0000 mg | ORAL | Status: DC | PRN
  Filled 2020-11-05: qty 20.3

## 2020-11-05 MED ORDER — MIDAZOLAM HCL 2 MG/2ML IJ SOLN
INTRAMUSCULAR | Status: DC | PRN
  Administered 2020-11-05: 2 mg via INTRAVENOUS

## 2020-11-05 MED ORDER — KETOROLAC TROMETHAMINE 30 MG/ML IJ SOLN
15.0000 mg | Freq: Once | INTRAMUSCULAR | Status: AC
  Administered 2020-11-05: 15 mg via INTRAVENOUS

## 2020-11-05 MED ORDER — ONDANSETRON HCL 4 MG/2ML IJ SOLN: 4.0000 mg | Freq: Once | INTRAMUSCULAR | Status: DC | PRN

## 2020-11-05 MED ORDER — CHLORHEXIDINE GLUCONATE CLOTH 2 % EX PADS
6.0000 | MEDICATED_PAD | Freq: Once | CUTANEOUS | Status: AC
  Administered 2020-11-05: 6 via TOPICAL

## 2020-11-05 MED ORDER — EPHEDRINE SULFATE 50 MG/ML IJ SOLN
INTRAMUSCULAR | Status: DC | PRN
  Administered 2020-11-05: 10 mg via INTRAVENOUS
  Administered 2020-11-05: 5 mg via INTRAVENOUS
  Administered 2020-11-05: 10 mg via INTRAVENOUS

## 2020-11-05 MED ORDER — GABAPENTIN 100 MG PO CAPS
ORAL_CAPSULE | ORAL | Status: AC
  Administered 2020-11-05: 100 mg via ORAL
  Filled 2020-11-05: qty 1

## 2020-11-05 MED ORDER — CHLORHEXIDINE GLUCONATE 0.12 % MT SOLN
OROMUCOSAL | Status: AC
  Administered 2020-11-05: 15 mL via OROMUCOSAL
  Filled 2020-11-05: qty 15

## 2020-11-05 MED ORDER — HYDROCODONE-ACETAMINOPHEN 5-325 MG PO TABS
ORAL_TABLET | ORAL | Status: AC
  Filled 2020-11-05: qty 1

## 2020-11-05 MED ORDER — EPHEDRINE 5 MG/ML INJ
INTRAVENOUS | Status: AC
  Filled 2020-11-05: qty 10

## 2020-11-05 MED ORDER — ORAL CARE MOUTH RINSE: 15.0000 mL | Freq: Once | OROMUCOSAL | Status: AC

## 2020-11-05 MED ORDER — FENTANYL CITRATE (PF) 100 MCG/2ML IJ SOLN
INTRAMUSCULAR | Status: DC | PRN
  Administered 2020-11-05 (×2): 50 ug via INTRAVENOUS

## 2020-11-05 MED ORDER — DROPERIDOL 2.5 MG/ML IJ SOLN
0.6250 mg | Freq: Once | INTRAMUSCULAR | Status: DC | PRN
  Filled 2020-11-05: qty 2

## 2020-11-05 MED ORDER — ONDANSETRON HCL 4 MG/2ML IJ SOLN
INTRAMUSCULAR | Status: AC
  Filled 2020-11-05: qty 2

## 2020-11-05 MED ORDER — CHLORHEXIDINE GLUCONATE CLOTH 2 % EX PADS: 6.0000 | MEDICATED_PAD | Freq: Once | CUTANEOUS | Status: DC

## 2020-11-05 MED ORDER — KETOROLAC TROMETHAMINE 15 MG/ML IJ SOLN
INTRAMUSCULAR | Status: AC
  Filled 2020-11-05: qty 1

## 2020-11-05 MED ORDER — GABAPENTIN 100 MG PO CAPS: 100.0000 mg | ORAL_CAPSULE | ORAL | Status: AC

## 2020-11-05 MED ORDER — MIDAZOLAM HCL 2 MG/2ML IJ SOLN
INTRAMUSCULAR | Status: AC
  Filled 2020-11-05: qty 2

## 2020-11-05 MED ORDER — ACETAMINOPHEN 325 MG PO TABS: 325.0000 mg | ORAL_TABLET | ORAL | Status: DC | PRN

## 2020-11-05 MED ORDER — LACTATED RINGERS IV SOLN: INTRAVENOUS | Status: DC

## 2020-11-05 MED ORDER — LIDOCAINE HCL (PF) 2 % IJ SOLN
INTRAMUSCULAR | Status: AC
  Filled 2020-11-05: qty 5

## 2020-11-05 SURGICAL SUPPLY — 60 items
ADH SKN CLS APL DERMABOND .7 (GAUZE/BANDAGES/DRESSINGS) ×1
APL PRP STRL LF DISP 70% ISPRP (MISCELLANEOUS) ×1
BAG INFUSER PRESSURE 100CC (MISCELLANEOUS) IMPLANT
BAG SPEC RTRVL LRG 6X4 10 (ENDOMECHANICALS) ×1
CANISTER SUCT 1200ML W/VALVE (MISCELLANEOUS) IMPLANT
CANNULA REDUC XI 12-8 STAPL (CANNULA) ×1
CANNULA REDUCER 12-8 DVNC XI (CANNULA) ×1 IMPLANT
CHLORAPREP W/TINT 26 (MISCELLANEOUS) ×2 IMPLANT
CLIP VESOLOCK MED LG 6/CT (CLIP) ×2 IMPLANT
COVER TIP SHEARS 8 DVNC (MISCELLANEOUS) ×1 IMPLANT
COVER TIP SHEARS 8MM DA VINCI (MISCELLANEOUS) ×1
COVER WAND RF STERILE (DRAPES) ×2 IMPLANT
CUP MEDICINE 2OZ PLAST GRAD ST (MISCELLANEOUS) ×2 IMPLANT
DECANTER SPIKE VIAL GLASS SM (MISCELLANEOUS) ×2 IMPLANT
DEFOGGER SCOPE WARMER CLEARIFY (MISCELLANEOUS) ×2 IMPLANT
DERMABOND ADVANCED (GAUZE/BANDAGES/DRESSINGS) ×1
DERMABOND ADVANCED .7 DNX12 (GAUZE/BANDAGES/DRESSINGS) ×1 IMPLANT
DRAPE ARM DVNC X/XI (DISPOSABLE) ×4 IMPLANT
DRAPE COLUMN DVNC XI (DISPOSABLE) ×1 IMPLANT
DRAPE DA VINCI XI ARM (DISPOSABLE) ×4
DRAPE DA VINCI XI COLUMN (DISPOSABLE) ×1
ELECT CAUTERY BLADE TIP 2.5 (TIP) ×2
ELECT REM PT RETURN 9FT ADLT (ELECTROSURGICAL) ×2
ELECTRODE CAUTERY BLDE TIP 2.5 (TIP) ×1 IMPLANT
ELECTRODE REM PT RTRN 9FT ADLT (ELECTROSURGICAL) ×1 IMPLANT
GLOVE SURG SYN 7.0 (GLOVE) ×8 IMPLANT
GLOVE SURG SYN 7.5  E (GLOVE) ×4
GLOVE SURG SYN 7.5 E (GLOVE) ×4 IMPLANT
GOWN STRL REUS W/ TWL LRG LVL3 (GOWN DISPOSABLE) ×4 IMPLANT
GOWN STRL REUS W/TWL LRG LVL3 (GOWN DISPOSABLE) ×8
IRRIGATOR SUCT 8 DISP DVNC XI (IRRIGATION / IRRIGATOR) IMPLANT
IRRIGATOR SUCTION 8MM XI DISP (IRRIGATION / IRRIGATOR)
IV NS 1000ML (IV SOLUTION)
IV NS 1000ML BAXH (IV SOLUTION) IMPLANT
KIT PINK PAD W/HEAD ARE REST (MISCELLANEOUS) ×2
KIT PINK PAD W/HEAD ARM REST (MISCELLANEOUS) ×1 IMPLANT
L-HOOK LAP DISP 36CM (ELECTROSURGICAL) ×2
LABEL OR SOLS (LABEL) ×2 IMPLANT
LHOOK LAP DISP 36CM (ELECTROSURGICAL) ×1 IMPLANT
MANIFOLD NEPTUNE II (INSTRUMENTS) ×2 IMPLANT
NEEDLE HYPO 22GX1.5 SAFETY (NEEDLE) ×2 IMPLANT
NS IRRIG 500ML POUR BTL (IV SOLUTION) ×2 IMPLANT
OBTURATOR OPTICAL STANDARD 8MM (TROCAR) ×1
OBTURATOR OPTICAL STND 8 DVNC (TROCAR) ×1
OBTURATOR OPTICALSTD 8 DVNC (TROCAR) ×1 IMPLANT
PACK LAP CHOLECYSTECTOMY (MISCELLANEOUS) ×2 IMPLANT
PENCIL ELECTRO HAND CTR (MISCELLANEOUS) ×2 IMPLANT
POUCH SPECIMEN RETRIEVAL 10MM (ENDOMECHANICALS) ×2 IMPLANT
SEAL CANN UNIV 5-8 DVNC XI (MISCELLANEOUS) ×4 IMPLANT
SEAL XI 5MM-8MM UNIVERSAL (MISCELLANEOUS) ×4
SET TUBE SMOKE EVAC HIGH FLOW (TUBING) ×2 IMPLANT
SOLUTION ELECTROLUBE (MISCELLANEOUS) ×2 IMPLANT
SPONGE LAP 18X18 RF (DISPOSABLE) IMPLANT
SPONGE LAP 4X18 RFD (DISPOSABLE) IMPLANT
STAPLER CANNULA SEAL DVNC XI (STAPLE) ×1 IMPLANT
STAPLER CANNULA SEAL XI (STAPLE) ×1
SUT MNCRL AB 4-0 PS2 18 (SUTURE) ×2 IMPLANT
SUT VICRYL 0 AB UR-6 (SUTURE) ×6 IMPLANT
TAPE TRANSPORE STRL 2 31045 (GAUZE/BANDAGES/DRESSINGS) ×2 IMPLANT
TROCAR BALLN GELPORT 12X130M (ENDOMECHANICALS) ×2 IMPLANT

## 2020-11-05 NOTE — Transfer of Care (Signed)
Immediate Anesthesia Transfer of Care Note  Patient: Rebecca Patton  Procedure(s) Performed: XI ROBOTIC ASSISTED LAPAROSCOPIC CHOLECYSTECTOMY (N/A ) INDOCYANINE GREEN FLUORESCENCE IMAGING (ICG) (N/A )  Patient Location: PACU  Anesthesia Type:General  Level of Consciousness: awake  Airway & Oxygen Therapy: Patient Spontanous Breathing and Patient connected to face mask oxygen  Post-op Assessment: Post -op Vital signs reviewed and stable  Post vital signs: stable  Last Vitals:  Vitals Value Taken Time  BP 140/60 11/05/20 1241  Temp    Pulse 58 11/05/20 1244  Resp 17 11/05/20 1244  SpO2 100 % 11/05/20 1244  Vitals shown include unvalidated device data.  Last Pain:  Vitals:   11/05/20 0853  PainSc: 0-No pain         Complications: No complications documented.

## 2020-11-05 NOTE — Discharge Instructions (Addendum)

## 2020-11-05 NOTE — Op Note (Signed)
  Procedure Date:  11/05/2020  Pre-operative Diagnosis:  Symptomatic cholelithiasis  Post-operative Diagnosis:  Symptomatic cholelithiasis  Procedure:  Robotic assisted cholecystectomy with ICG FireFly cholangiogram  Surgeon:  Melvyn Neth, MD  Assistant:  Adline Potter, PA-S  Anesthesia:  General endotracheal  Estimated Blood Loss:  10 ml  Specimens:  gallbladder  Complications:  None  Indications for Procedure:  This is a 75 y.o. female who presents with abdominal pain and workup revealing symptomatic cholelithiasis.  The benefits, complications, treatment options, and expected outcomes were discussed with the patient. The risks of bleeding, infection, recurrence of symptoms, failure to resolve symptoms, bile duct damage, bile duct leak, retained common bile duct stone, bowel injury, and need for further procedures were all discussed with the patient and she was willing to proceed.  Description of Procedure: The patient was correctly identified in the preoperative area and brought into the operating room.  The patient was placed supine with VTE prophylaxis in place.  Appropriate time-outs were performed.  Anesthesia was induced and the patient was intubated.  Appropriate antibiotics were infused.  The abdomen was prepped and draped in a sterile fashion. An infraumbilical incision was made. A cutdown technique was used to enter the abdominal cavity without injury, and a 12 mm robotic port was inserted.  Pneumoperitoneum was obtained with appropriate opening pressures.  Two 8 mm ports were placed in the right side at the level of the umbilicus.  Cautery was then used to lyse adhesions of the omentum to the abdominal wall from her prior supraumbilical hernia repair.  This then allowed for placement of the third 8 mm port on the left side.    The DaVinci platform was docked, camera targeted, and instruments were placed under direct visualization.  The gallbladder was identified.  The  fundus was grasped and retracted cephalad.  Adhesions were lysed bluntly and with electrocautery. The infundibulum was grasped and retracted laterally, exposing the peritoneum overlying the gallbladder.  This was incised with electrocautery and extended on either side of the gallbladder.  FireFly cholangiogram was then obtained, and we were able to clearly identify the cystic duct and common bile duct.  The cystic duct and cystic artery were carefully dissected with combination of cautery and blunt dissection.  Both were clipped twice proximally and once distally, cutting in between.  The gallbladder was taken from the gallbladder fossa in a retrograde fashion with electrocautery. The gallbladder was placed in an Endocatch bag. The liver bed was inspected and any bleeding was controlled with electrocautery. The right upper quadrant was then inspected again revealing intact clips, no bleeding, and no ductal injury.  The 8 mm ports were removed under direct visualization and the 12 mm port was removed.  The Endocatch bag was brought out via the umbilical incision. The fascial opening was closed using 0 vicryl suture.  Local anesthetic was infused in all incisions and the incisions were closed with 4-0 Monocryl.  The wounds were cleaned and sealed with DermaBond.  The patient was emerged from anesthesia and extubated and brought to the recovery room for further management.  The patient tolerated the procedure well and all counts were correct at the end of the case.   Melvyn Neth, MD

## 2020-11-05 NOTE — Anesthesia Preprocedure Evaluation (Signed)
Anesthesia Evaluation  Patient identified by MRN, date of birth, ID band Patient awake    Reviewed: Allergy & Precautions, H&P , NPO status , reviewed documented beta blocker date and time   Airway Mallampati: II  TM Distance: >3 FB Neck ROM: full    Dental  (+) Teeth Intact, Caps   Pulmonary former smoker,    Pulmonary exam normal        Cardiovascular hypertension, Normal cardiovascular exam+ dysrhythmias   Hx tachycardia, resolves w rest, no meds. SR on EKG   Neuro/Psych PSYCHIATRIC DISORDERS Anxiety Depression    GI/Hepatic GERD  Controlled,  Endo/Other  Hypothyroidism   Renal/GU      Musculoskeletal  (+) Arthritis ,   Abdominal   Peds  Hematology   Anesthesia Other Findings Past Medical History: No date: Arthritis 09/2020: COVID-19 No date: Depression No date: Dysrhythmia     Comment:  ST No date: GERD (gastroesophageal reflux disease) No date: High blood pressure No date: Hypothyroidism 2006: MRSA infection     Comment:  on nose 2009: Uterine cancer ([place]) Past Surgical History: 2009 : ABDOMINAL HYSTERECTOMY     Comment:  BSO No date: BUNIONECTOMY 01/23/2020: COLONOSCOPY WITH PROPOFOL; N/A     Comment:  Procedure: COLONOSCOPY WITH PROPOFOL;  Surgeon: [place],               Benay Pike, MD;  Location: [facility];  Service:               Gastroenterology;  Laterality: N/A; No date: EXCISION NEUROMA 05/18/2018: INSERTION OF MESH; N/A     Comment:  Procedure: INSERTION OF MESH;  Surgeon: Vickie Epley, MD;  Location: [facility] ORS;  Service: General;                Laterality: N/A; No date: NASAL SINUS SURGERY No date: TUBAL LIGATION 05/18/7590: UMBILICAL HERNIA REPAIR; N/A     Comment:  Procedure: HERNIA REPAIR SUPRA-UMBILICAL ADULT;                Surgeon: Vickie Epley, MD;  Location: [facility] ORS;                Service: General;  Laterality: N/A;   Reproductive/Obstetrics                              Anesthesia Physical Anesthesia Plan  ASA: II  Anesthesia Plan: General   Post-op Pain Management:    Induction: Intravenous  PONV Risk Score and Plan: Ondansetron and Treatment may vary due to age or medical condition  Airway Management Planned: Oral ETT  Additional Equipment:   Intra-op Plan:   Post-operative Plan: Extubation in OR  Informed Consent: I have reviewed the patients History and Physical, chart, labs and discussed the procedure including the risks, benefits and alternatives for the proposed anesthesia with the patient or authorized representative who has indicated his/her understanding and acceptance.     Dental Advisory Given  Plan Discussed with: CRNA  Anesthesia Plan Comments:         Anesthesia Quick Evaluation

## 2020-11-05 NOTE — Anesthesia Procedure Notes (Signed)
Procedure Name: Intubation Date/Time: 11/05/2020 10:32 AM Performed by: Aline Brochure, CRNA Pre-anesthesia Checklist: Patient identified, Suction available, Emergency Drugs available, Patient being monitored and Timeout performed Patient Re-evaluated:Patient Re-evaluated prior to induction Oxygen Delivery Method: Circle system utilized Preoxygenation: Pre-oxygenation with 100% oxygen Induction Type: IV induction Ventilation: Mask ventilation without difficulty Laryngoscope Size: McGraph and 3 Grade View: Grade I Tube type: Oral Tube size: 7.0 mm Number of attempts: 1 Airway Equipment and Method: Stylet and Video-laryngoscopy Placement Confirmation: ETT inserted through vocal cords under direct vision,  positive ETCO2 and breath sounds checked- equal and bilateral Secured at: 21 cm Tube secured with: Tape Dental Injury: Teeth and Oropharynx as per pre-operative assessment

## 2020-11-05 NOTE — Interval H&P Note (Signed)
History and Physical Interval Note:  11/05/2020 9:58 AM  Rebecca Patton  has presented today for surgery, with the diagnosis of symptomatic cholelithiasis.  The various methods of treatment have been discussed with the patient and family. After consideration of risks, benefits and other options for treatment, the patient has consented to  Procedure(s): XI ROBOTIC ASSISTED LAPAROSCOPIC CHOLECYSTECTOMY (N/A) [place] (ICG) (N/A) as a surgical intervention.  The patient's history has been reviewed, patient examined, no change in status, stable for surgery.  I have reviewed the patient's chart and labs.  Questions were answered to the patient's satisfaction.     Sariya Trickey

## 2020-11-06 LAB — SURGICAL PATHOLOGY

## 2020-11-12 NOTE — Anesthesia Postprocedure Evaluation (Signed)
Anesthesia Post Note  Patient: Rebecca Patton  Procedure(s) Performed: XI ROBOTIC ASSISTED LAPAROSCOPIC CHOLECYSTECTOMY (N/A ) INDOCYANINE GREEN FLUORESCENCE IMAGING (ICG) (N/A )  Patient location during evaluation: PACU Anesthesia Type: General Level of consciousness: awake and alert Pain management: pain level controlled Vital Signs Assessment: post-procedure vital signs reviewed and stable Respiratory status: spontaneous breathing, nonlabored ventilation and respiratory function stable Cardiovascular status: blood pressure returned to baseline and stable Postop Assessment: no apparent nausea or vomiting Anesthetic complications: no   No complications documented.   Last Vitals:  Vitals:   11/05/20 1420 11/05/20 1454  BP: (!) 125/57 (!) 120/57  Pulse: (!) 56 62  Resp: 20 18  Temp:    SpO2: 96% 97%    Last Pain:  Vitals:   11/06/20 1429  TempSrc:   PainSc: 0-No pain                 Alphonsus Sias

## 2020-11-19 ENCOUNTER — Encounter: Payer: Self-pay | Admitting: Physician Assistant

## 2020-11-19 ENCOUNTER — Ambulatory Visit (INDEPENDENT_AMBULATORY_CARE_PROVIDER_SITE_OTHER): Payer: Medicare HMO | Admitting: Physician Assistant

## 2020-11-19 ENCOUNTER — Other Ambulatory Visit: Payer: Self-pay

## 2020-11-19 VITALS — BP 148/82 | HR 75 | Temp 98.3°F | Wt 204.4 lb

## 2020-11-19 DIAGNOSIS — K8020 Calculus of gallbladder without cholecystitis without obstruction: Secondary | ICD-10-CM

## 2020-11-19 DIAGNOSIS — Z09 Encounter for follow-up examination after completed treatment for conditions other than malignant neoplasm: Secondary | ICD-10-CM

## 2020-11-19 NOTE — Patient Instructions (Signed)
No heavy lifting of anything over 15 lbs for 2 more weeks. Continue to shower as normal. Tylenol or ibuprofen for pain as needed. Please call us if you have any questions or concerns.

## 2020-11-19 NOTE — Progress Notes (Signed)
[facility] POST-OP OFFICE VISIT  11/19/2020  HPI: Rebecca Patton is a 75 y.o. female 14 days s/p robotic assisted laparoscopic cholecystectomy with Dr Hampton Abbot for symptomatic cholelithiasis  She is overall doing well No abdominal pain, fever, chills, nausea, emesis, or diarrhea She is tolerating PO without issue No issues with her incisions No other complaints  Vital signs: BP (!) 148/82   Pulse 75   Temp 98.3 F (36.8 C) (Oral)   Wt 204 lb 6.4 oz (92.7 kg)   SpO2 96%   BMI 32.99 kg/m    Physical Exam: Constitutional: Well appearing female, NAD Abdomen: Soft, non-tender, non-distended, no rebound/guarding Skin: Laparoscopic incisions are healing well, no erythema or drainage   Assessment/Plan: This is a 75 y.o. female 14 days s/p robotic assisted laparoscopic cholecystectomy with Dr Hampton Abbot for symptomatic cholelithiasis   - Pain control prn; Tylenol / Motrin  - Reviewed wound care  - Reviewed lifting restrictions  - Reviewed Pathology: [place]; negative for malignancy   - She will rtc on an as needed basis   -- Edison Simon, PA-C [facility] 11/19/2020, 2:18 PM [phone] M-F: 7am - 4pm

## 2020-12-30 ENCOUNTER — Encounter: Payer: Medicare HMO | Admitting: Family Medicine

## 2021-01-09 ENCOUNTER — Other Ambulatory Visit: Payer: Self-pay | Admitting: Family Medicine

## 2021-01-09 DIAGNOSIS — R059 Cough, unspecified: Secondary | ICD-10-CM

## 2021-01-09 DIAGNOSIS — K219 Gastro-esophageal reflux disease without esophagitis: Secondary | ICD-10-CM

## 2021-01-09 NOTE — Telephone Encounter (Signed)
Requested Prescriptions  Pending Prescriptions Disp Refills  . omeprazole (PRILOSEC) 20 MG capsule [Pharmacy Med Name: OMEPRAZOL RX CAP 20MG ] 180 capsule 0    Sig: TAKE 1 CAPSULE TWICE DAILY BEFORE MEALS     Gastroenterology: Proton Pump Inhibitors Passed - 01/09/2021  3:15 PM      Passed - Valid encounter within last 12 months    Recent Outpatient Visits          2 months ago Pre-op exam   [place], [place], [place]   2 months ago [place] Jerrol Banana., MD   3 months ago Suspected COVID-19 virus infection   [facility], Dionne Bucy, MD   3 months ago Cough   [facility] Jerrol Banana., MD   3 months ago Essential (primary) hypertension   [facility] Jerrol Banana., MD      Future Appointments            In 2 months Jerrol Banana., MD [facility] [place]

## 2021-02-22 ENCOUNTER — Other Ambulatory Visit: Payer: Self-pay | Admitting: Family Medicine

## 2021-02-22 ENCOUNTER — Telehealth: Payer: Self-pay

## 2021-02-22 ENCOUNTER — Other Ambulatory Visit: Payer: Self-pay

## 2021-02-22 DIAGNOSIS — I10 Essential (primary) hypertension: Secondary | ICD-10-CM

## 2021-02-22 MED ORDER — LOSARTAN POTASSIUM 100 MG PO TABS: ORAL_TABLET | ORAL | 0 refills | Status: DC

## 2021-02-22 NOTE — Telephone Encounter (Signed)
Medication Refill - Medication: losartan 100 mg.  Pt has an appt with dr Rosanna Randy 04/08/2021. Pt has been with out losartan for a week  Has the patient contacted their pharmacy? YES [employer] told her to call md office. Pt would like #30 sent to [facility] [place] [address] in [place] and #90 sent to [facility] order phone [phone]  Agent: Please be advised that RX refills may take up to 3 business days. We ask that you follow-up with your pharmacy.

## 2021-02-22 NOTE — Telephone Encounter (Signed)
30 day supply sent to [facility]

## 2021-03-09 ENCOUNTER — Other Ambulatory Visit: Payer: Self-pay | Admitting: Surgery

## 2021-03-09 ENCOUNTER — Other Ambulatory Visit: Payer: Self-pay

## 2021-03-09 ENCOUNTER — Telehealth: Payer: Self-pay | Admitting: *Deleted

## 2021-03-09 MED ORDER — CHOLESTYRAMINE 4 G PO PACK: 4.0000 g | PACK | Freq: Every day | ORAL | 0 refills | Status: DC

## 2021-03-09 NOTE — Telephone Encounter (Signed)
Spoke with patient and per Dr Hampton Abbot will send in prescription for Cholestyramine 4gm packet once daily #30. She will follow up with him in 2 weeks.

## 2021-03-09 NOTE — Telephone Encounter (Signed)
Patient called and stated that she had her gallbladder removed on 11/05/20 by Dr Hampton Abbot and ever since then about 99% of the time after she eats she has to run to the bathroom with diarrhea. She stated that it is anywhere from 10 to 30 minutes after she eats. She wants to know what she can do or not do to help control the diarrhea.

## 2021-03-27 ENCOUNTER — Other Ambulatory Visit: Payer: Self-pay | Admitting: Family Medicine

## 2021-03-28 NOTE — Telephone Encounter (Signed)
last RF 07/21/20 dc'd 10/01/20 error Rebecca Patton, Rebecca Patton

## 2021-03-31 ENCOUNTER — Ambulatory Visit: Payer: Medicare HMO | Admitting: Surgery

## 2021-04-08 ENCOUNTER — Ambulatory Visit (INDEPENDENT_AMBULATORY_CARE_PROVIDER_SITE_OTHER): Payer: Medicare HMO | Admitting: Family Medicine

## 2021-04-08 ENCOUNTER — Encounter: Payer: Self-pay | Admitting: Family Medicine

## 2021-04-08 ENCOUNTER — Other Ambulatory Visit: Payer: Self-pay

## 2021-04-08 VITALS — BP 128/60 | HR 62 | Temp 98.7°F | Resp 16 | Ht 66.0 in | Wt 212.0 lb

## 2021-04-08 DIAGNOSIS — E7800 Pure hypercholesterolemia, unspecified: Secondary | ICD-10-CM

## 2021-04-08 DIAGNOSIS — E039 Hypothyroidism, unspecified: Secondary | ICD-10-CM

## 2021-04-08 DIAGNOSIS — Z0000 Encounter for general adult medical examination without abnormal findings: Secondary | ICD-10-CM

## 2021-04-08 DIAGNOSIS — I10 Essential (primary) hypertension: Secondary | ICD-10-CM

## 2021-04-08 NOTE — Progress Notes (Signed)
Complete physical exam   Patient: Rebecca Patton   DOB: 1946/04/19   75 y.o. Female  MRN: [id number] Visit Date: 04/08/2021  Today's healthcare provider: [name], MD   Chief Complaint  Patient presents with   Annual Exam   Subjective    Rebecca Patton is a 75 y.o. female who presents today for a complete physical exam.  She reports consuming a general diet. The patient does not participate in regular exercise at present. She generally feels fairly well. She reports sleeping fairly well. She does not have additional problems to discuss today.   Past Medical History:  Diagnosis Date   Arthritis    COVID-19 09/2020   Depression    Dysrhythmia    ST   GERD (gastroesophageal reflux disease)    High blood pressure    Hypothyroidism    MRSA infection 2006   on nose   Uterine cancer ([place]) 2009   Past Surgical History:  Procedure Laterality Date   ABDOMINAL HYSTERECTOMY  2009    BSO   BUNIONECTOMY     COLONOSCOPY WITH PROPOFOL N/A 01/23/2020   Procedure: COLONOSCOPY WITH PROPOFOL;  Surgeon: Toledo, Benay Pike, MD;  Location: [facility];  Service: Gastroenterology;  Laterality: N/A;   EXCISION NEUROMA     INSERTION OF MESH N/A 05/18/2018   Procedure: INSERTION OF MESH;  Surgeon: Vickie Epley, MD;  Location: [facility];  Service: General;  Laterality: N/A;   NASAL SINUS SURGERY     TUBAL LIGATION     UMBILICAL HERNIA REPAIR N/A 05/18/2018   Procedure: HERNIA REPAIR SUPRA-UMBILICAL ADULT;  Surgeon: Vickie Epley, MD;  Location: [facility] OR[facility];  Service: General;  Laterality: N/A;   Social History   Socioeconomic History   Marital status: Widowed    Spouse name: Not on file   Number of children: 2   Years of education: Not on file   Highest education level: Some college, no degree  Occupational History   Occupation: receptionist part time  Tobacco Use   Smoking status: Former    Packs/day: 0.50    Years: 25.00    Pack years: 12.50    Types: Cigarettes     Quit date: 09/11/1997    Years since quitting: 23.5   Smokeless tobacco: Never  Vaping Use   Vaping Use: Never used  Substance and Sexual Activity   Alcohol use: No    Alcohol/week: 0.0 standard drinks   Drug use: No   Sexual activity: Not Currently  Other Topics Concern   Not on file  Social History Narrative   Husband is physically and mentally disable following a car wreck in 2006.   Social Determinants of Health   Financial Resource Strain: Low Risk    Difficulty of Paying Living Expenses: Not hard at all  Food Insecurity: No Food Insecurity   Worried About Charity fundraiser in the Last Year: Never true   [place] in the Last Year: Never true  Transportation Needs: No Transportation Needs   Lack of Transportation (Medical): No   Lack of Transportation (Non-Medical): No  Physical Activity: Inactive   Days of Exercise per Week: 0 days   Minutes of Exercise per Session: 0 min  Stress: Stress Concern Present   Feeling of Stress : To some extent  Social Connections: Moderately Isolated   Frequency of Communication with Friends and Family: More than three times a week   Frequency of Social Gatherings with  Friends and Family: More than three times a week   Attends Religious Services: More than 4 times per year   Active Member of Clubs or Organizations: No   Attends Archivist Meetings: Never   Marital Status: Widowed  Human resources officer Violence: Not At Risk   Fear of Current or Ex-Partner: No   Emotionally Abused: No   Physically Abused: No   Sexually Abused: No   Family Status  Relation Name Status   Mother  Deceased at age 19       heart problems-valve issues   Father  Deceased at age 49   Sister  51   Brother  Alive   Daughter  Alive   Daughter  Alive   Brother  Deceased       in his 69s   Brother  Alive   Neg Hx  (Not Specified)   Family History  Problem Relation Age of Onset   Parkinson's disease Mother    Diabetes Father    CAD  Father    Cancer Sister        squamous cell cancer in her eye   Pancreatic cancer Brother    Tremor Brother    Breast cancer Neg Hx    No Known Allergies  Patient Care Team: Jerrol Banana., MD as PCP - General (Family Medicine) Anell Barr, OD as Consulting Physician (Optometry) Babbie, Raiford Simmonds, CNM as Midwife (Obstetrics and Gynecology) Jannet Mantis, MD (Dermatology) Efrain Sella, MD as Consulting Physician (Gastroenterology)   Medications: Outpatient Medications Prior to Visit  Medication Sig   aspirin 81 MG tablet Take 81 mg by mouth every 3 (three) days.   Bioflavonoid Products (QUERCETIN COMPLEX IMMUNE) CAPS Take 1 tablet by mouth daily.   fluticasone (FLONASE) 50 MCG/ACT nasal spray Place 1 spray into both nostrils daily.   Glycerin-Polysorbate 80 (REFRESH DRY EYE THERAPY OP) Place 1 drop into both eyes daily.   hydrochlorothiazide (HYDRODIURIL) 25 MG tablet Take 1 tablet (25 mg total) by mouth daily.   ibuprofen (ADVIL) 600 MG tablet Take 1 tablet (600 mg total) by mouth every 8 (eight) hours as needed for mild pain or moderate pain.   levothyroxine (SYNTHROID) 50 MCG tablet Take 1 tablet (50 mcg total) by mouth daily before breakfast.   losartan (COZAAR) 100 MG tablet TAKE 1 TABLET(100 MG) BY MOUTH DAILY   melatonin 5 MG TABS Take 5 mg by mouth daily.   metoprolol succinate (TOPROL-XL) 25 MG 24 hr tablet TAKE 1 TABLET DAILY . TAKE WITH OR IMMEDIATELY&nbsp;&nbsp;&nbsp;&nbsp;&nbsp;&nbsp;&nbsp;&nbsp;FOLLOWING A MEAL.   MULTIPLE VITAMIN PO Take 1 tablet by mouth daily. Centrum Silver   omeprazole (PRILOSEC) 20 MG capsule TAKE 1 CAPSULE TWICE DAILY BEFORE MEALS   sertraline (ZOLOFT) 100 MG tablet Take 1 tablet (100 mg total) by mouth daily.   vitamin B-12 (CYANOCOBALAMIN) 100 MCG tablet Take 200 mcg by mouth daily.   zinc gluconate 50 MG tablet Take 50 mg by mouth daily.   benzonatate (TESSALON) 100 MG capsule Take 1 capsule (100 mg total) by mouth 2  (two) times daily as needed for cough.   cholestyramine (QUESTRAN) 4 g packet Take 1 packet (4 g total) by mouth daily with breakfast. (Patient not taking: Reported on 04/08/2021)   oxyCODONE (OXY IR/ROXICODONE) 5 MG immediate release tablet Take 1 tablet (5 mg total) by mouth every 4 (four) hours as needed for severe pain.   No facility-administered medications prior to visit.    Review of  Systems  All other systems reviewed and are negative.     Objective    BP 128/60   Pulse 62   Temp 98.7 F (37.1 C)   Resp 16   Ht '5\' 6"'$  (1.676 m)   Wt 212 lb (96.2 kg)   BMI 34.22 kg/m     Physical Exam Vitals reviewed.  Constitutional:      Appearance: She is well-developed.  HENT:     Head: Normocephalic and atraumatic.     Right Ear: External ear normal.     Left Ear: External ear normal.     Nose: Nose normal.  Eyes:     General: No scleral icterus.    Conjunctiva/sclera: Conjunctivae normal.  Neck:     Thyroid: No thyromegaly.  Cardiovascular:     Rate and Rhythm: Normal rate and regular rhythm.     Heart sounds: Normal heart sounds.  Pulmonary:     Effort: Pulmonary effort is normal.     Breath sounds: Normal breath sounds.  Abdominal:     General: There is no distension.     Palpations: Abdomen is soft.     Tenderness: There is no abdominal tenderness.  Skin:    General: Skin is warm and dry.  Neurological:     General: No focal deficit present.     Mental Status: She is alert and oriented to person, place, and time.  Psychiatric:        Mood and Affect: Mood normal.        Behavior: Behavior normal.        Thought Content: Thought content normal.        Judgment: Judgment normal.      Last depression screening scores PHQ 2/9 Scores 04/08/2021 10/23/2020 07/21/2020  PHQ - 2 Score 0 0 0  PHQ- 9 Score 0 0 5   Last fall risk screening Fall Risk  04/08/2021  Falls in the past year? 0  Number falls in past yr: 0  Injury with Fall? 0  Risk for fall due to : No  Fall Risks  Follow up Falls evaluation completed   Last Audit-C alcohol use screening Alcohol Use Disorder Test (AUDIT) 04/08/2021  1. How often do you have a drink containing alcohol? 0  2. How many drinks containing alcohol do you have on a typical day when you are drinking? 0  3. How often do you have six or more drinks on one occasion? 0  AUDIT-C Score 0  Alcohol Brief Interventions/Follow-up -   A score of 3 or more in women, and 4 or more in men indicates increased risk for alcohol abuse, EXCEPT if all of the points are from question 1   No results found for any visits on 04/08/21.  Assessment & Plan    Routine Health Maintenance and Physical Exam  Exercise Activities and Dietary recommendations  Goals      Exercise 3x per week (30 min per time)     Recommend exercising 3 days a week for at least 30 minutes. Pt to start swimming in Spring.          Immunization History  Administered Date(s) Administered   Fluad Quad(high Dose 65+) 07/21/2020   Influenza Split 07/03/2012   Influenza, High Dose Seasonal PF 07/03/2018, 06/26/2019   Influenza, Seasonal, Injecte, Preservative Fre 06/20/2017   Influenza,inj,Quad PF,6+ Mos 08/13/2013   Influenza,inj,quad, With Preservative 06/29/2019, 07/04/2019   Influenza-Unspecified 06/13/2015   Moderna Sars-Covid-2 Vaccination 11/06/2019, 12/04/2019   Pneumococcal  Conjugate-13 08/13/2013   Pneumococcal Polysaccharide-23 06/14/2016   Zoster, Live 07/03/2012    Health Maintenance  Topic Date Due   Zoster Vaccines- Shingrix (1 of 2) Never done   COVID-19 Vaccine (3 - Moderna risk series) 01/01/2020   TETANUS/TDAP  07/06/2021 (Originally 12/20/1964)   INFLUENZA VACCINE  04/12/2021   MAMMOGRAM  08/27/2022   DEXA SCAN  04/10/2024   COLONOSCOPY (Pts 45-90yr Insurance coverage will need to be confirmed)  01/23/2030   Hepatitis C Screening  Completed   PNA vac Low Risk Adult  Completed   HPV VACCINES  Aged Out    Discussed health  benefits of physical activity, and encouraged her to engage in regular exercise appropriate for her age and condition 1. Encounter for Medicare annual wellness exam  - CBC with Differential/Platelet - Comprehensive metabolic panel - Lipid panel - TSH Patient needs tetanus shot. 2. Annual physical exam Up-to-date on colonoscopy and mammogram.  3. Essential (primary) hypertension  - CBC with Differential/Platelet - Comprehensive metabolic panel - Lipid panel - TSH  4. Pure hypercholesterolemia  - CBC with Differential/Platelet - Comprehensive metabolic panel - Lipid panel - TSH  5. Adult hypothyroidism  - CBC with Differential/Platelet - Comprehensive metabolic panel - Lipid panel - TSH   Return in about 6 months (around 10/09/2021).     I, RWilhemena Durie MD, have reviewed all documentation for this visit. The documentation on 04/24/21 for the exam, diagnosis, procedures, and orders are all accurate and complete.    Alante Weimann GCranford Mon MD  B[phone](phone) [phone](fax)  [place]

## 2021-04-13 DIAGNOSIS — Z0000 Encounter for general adult medical examination without abnormal findings: Secondary | ICD-10-CM | POA: Diagnosis not present

## 2021-04-13 DIAGNOSIS — E039 Hypothyroidism, unspecified: Secondary | ICD-10-CM | POA: Diagnosis not present

## 2021-04-13 DIAGNOSIS — I10 Essential (primary) hypertension: Secondary | ICD-10-CM | POA: Diagnosis not present

## 2021-04-13 DIAGNOSIS — E7800 Pure hypercholesterolemia, unspecified: Secondary | ICD-10-CM | POA: Diagnosis not present

## 2021-04-14 LAB — COMPREHENSIVE METABOLIC PANEL
ALT: 21 IU/L (ref 0–32)
AST: 25 IU/L (ref 0–40)
Albumin/Globulin Ratio: 1.7 (ref 1.2–2.2)
Albumin: 4.3 g/dL (ref 3.7–4.7)
Alkaline Phosphatase: 95 IU/L (ref 44–121)
BUN/Creatinine Ratio: 20 (ref 12–28)
BUN: 19 mg/dL (ref 8–27)
Bilirubin Total: 0.4 mg/dL (ref 0.0–1.2)
CO2: 21 mmol/L (ref 20–29)
Calcium: 9.5 mg/dL (ref 8.7–10.3)
Chloride: 101 mmol/L (ref 96–106)
Creatinine, Ser: 0.96 mg/dL (ref 0.57–1.00)
Globulin, Total: 2.5 g/dL (ref 1.5–4.5)
Glucose: 112 mg/dL — ABNORMAL HIGH (ref 65–99)
Potassium: 3.9 mmol/L (ref 3.5–5.2)
Sodium: 141 mmol/L (ref 134–144)
Total Protein: 6.8 g/dL (ref 6.0–8.5)
eGFR: 62 mL/min/{1.73_m2} (ref >59)

## 2021-04-14 LAB — CBC WITH DIFFERENTIAL/PLATELET
Basophils Absolute: 0.1 10*3/uL (ref 0.0–0.2)
Basos: 1 %
EOS (ABSOLUTE): 0.3 10*3/uL (ref 0.0–0.4)
Eos: 4 %
Hematocrit: 45 % (ref 34.0–46.6)
Hemoglobin: 14.9 g/dL (ref 11.1–15.9)
Immature Grans (Abs): 0 10*3/uL (ref 0.0–0.1)
Immature Granulocytes: 0 %
Lymphocytes Absolute: 1.7 10*3/uL (ref 0.7–3.1)
Lymphs: 22 %
MCH: 28.9 pg (ref 26.6–33.0)
MCHC: 33.1 g/dL (ref 31.5–35.7)
MCV: 87 fL (ref 79–97)
Monocytes Absolute: 0.6 10*3/uL (ref 0.1–0.9)
Monocytes: 8 %
Neutrophils Absolute: 5 10*3/uL (ref 1.4–7.0)
Neutrophils: 65 %
Platelets: 243 10*3/uL (ref 150–450)
RBC: 5.16 x10E6/uL (ref 3.77–5.28)
RDW: 14.1 % (ref 11.7–15.4)
WBC: 7.8 10*3/uL (ref 3.4–10.8)

## 2021-04-14 LAB — LIPID PANEL
Chol/HDL Ratio: 3.9 ratio (ref 0.0–4.4)
Cholesterol, Total: 236 mg/dL — ABNORMAL HIGH (ref 100–199)
HDL: 60 mg/dL (ref >39)
LDL Chol Calc (NIH): 144 mg/dL — ABNORMAL HIGH (ref 0–99)
Triglycerides: 180 mg/dL — ABNORMAL HIGH (ref 0–149)
VLDL Cholesterol Cal: 32 mg/dL (ref 5–40)

## 2021-04-14 LAB — TSH: TSH: 5.52 u[IU]/mL — ABNORMAL HIGH (ref 0.450–4.500)

## 2021-05-11 IMAGING — US US BREAST*R* LIMITED INC AXILLA
1 series · 11 of 11 positions shown · non-contrast
Comparison: Previous exam(s).

CLINICAL DATA: Screening recall for a right breast mass.

EXAM:
ULTRASOUND OF THE RIGHT BREAST

[Series 1: us breast*right* limited inc axilla · 0.08mm/px · 11 of 11 slices shown]
[im 1/11]
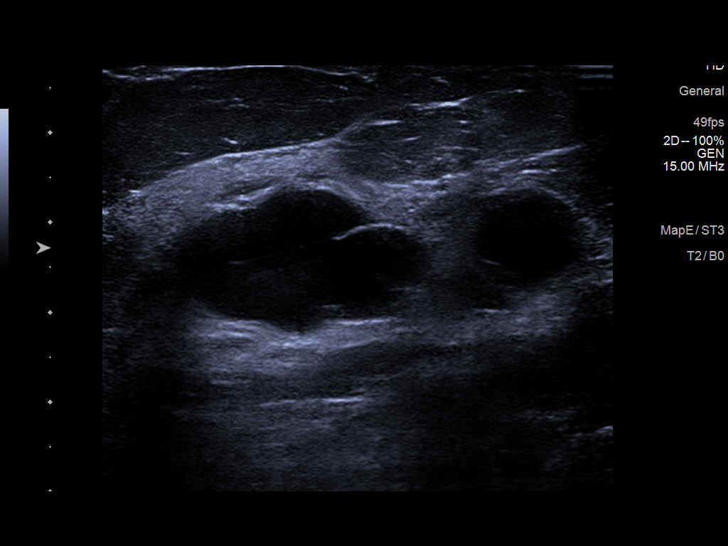
[im 2/11]
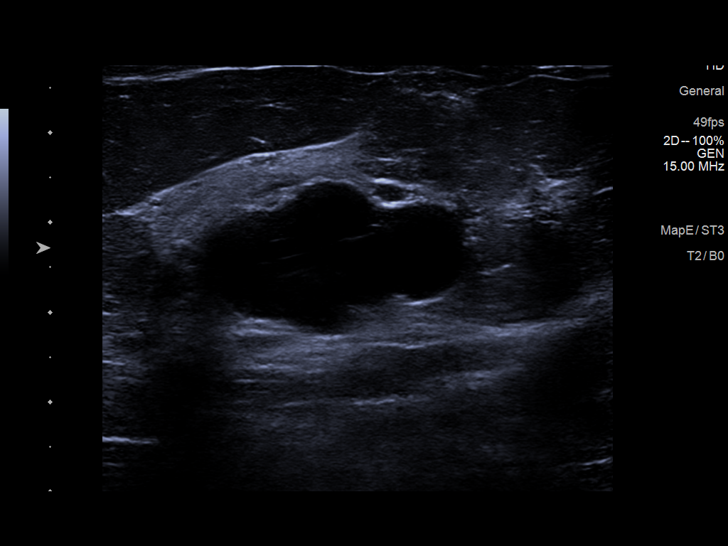
[im 3/11]
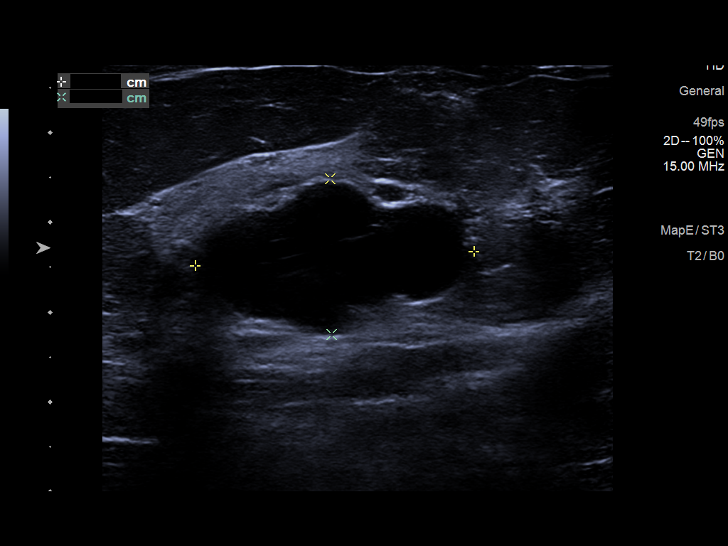
[im 4/11]
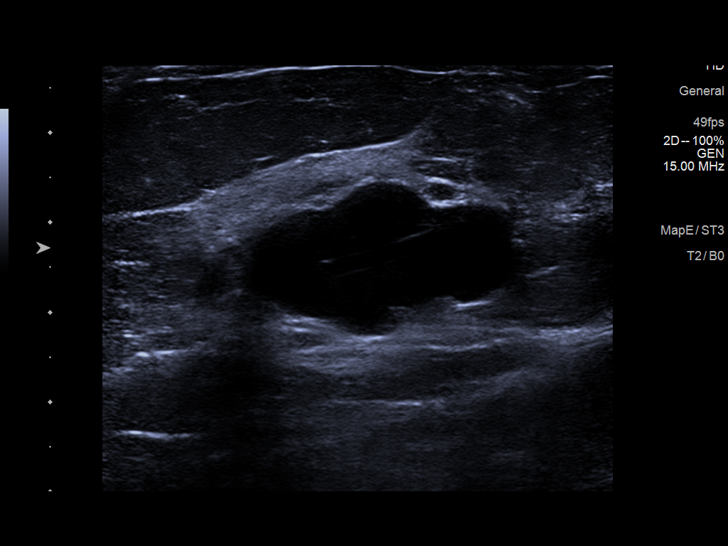
[im 5/11]
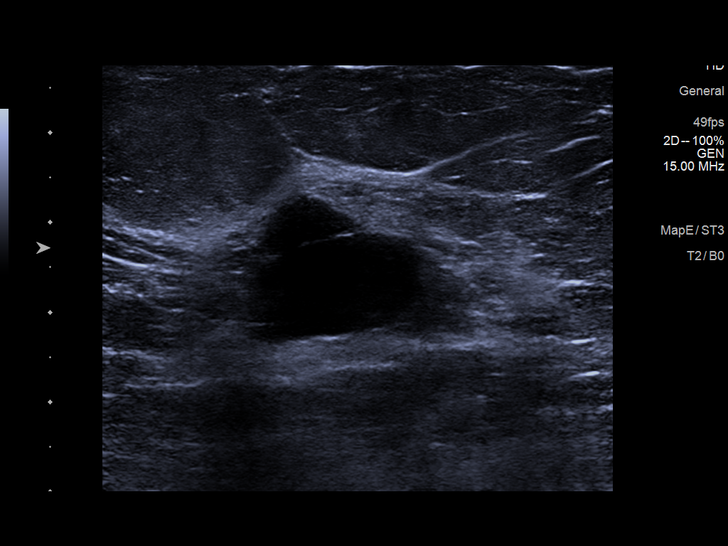
[im 6/11]
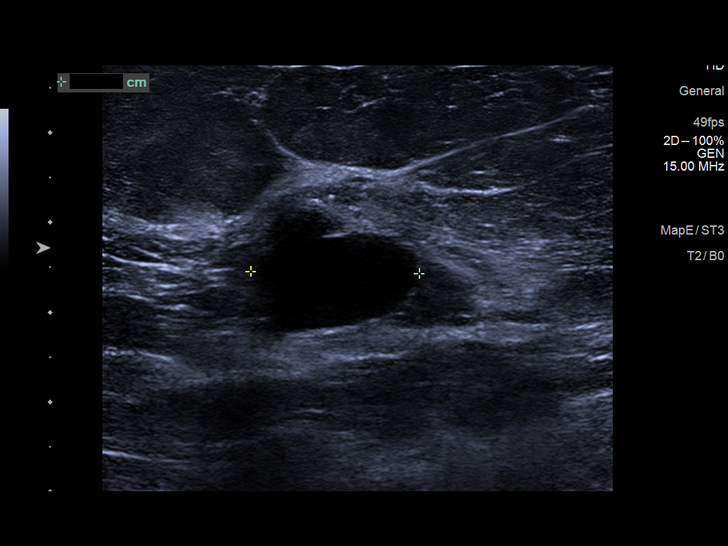
[im 7/11]
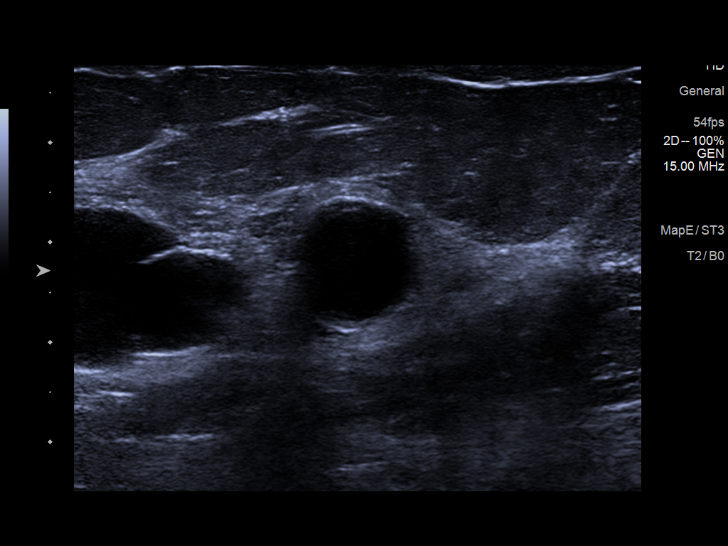
[im 8/11]
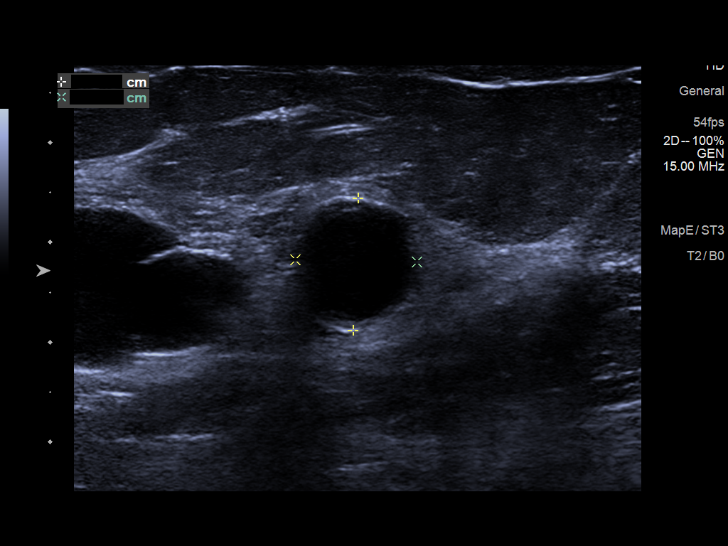
[im 9/11]
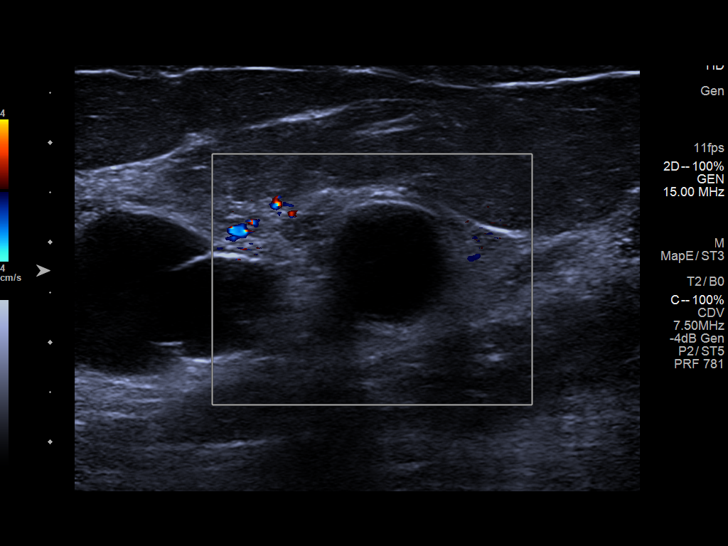
[im 10/11]
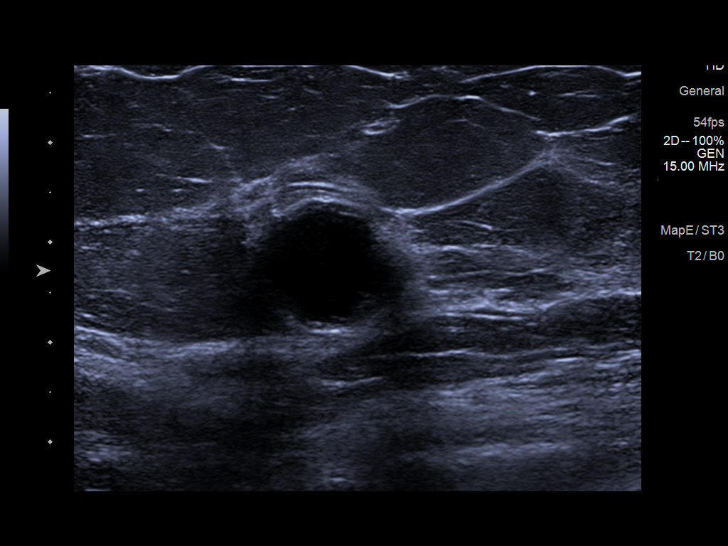
[im 11/11]
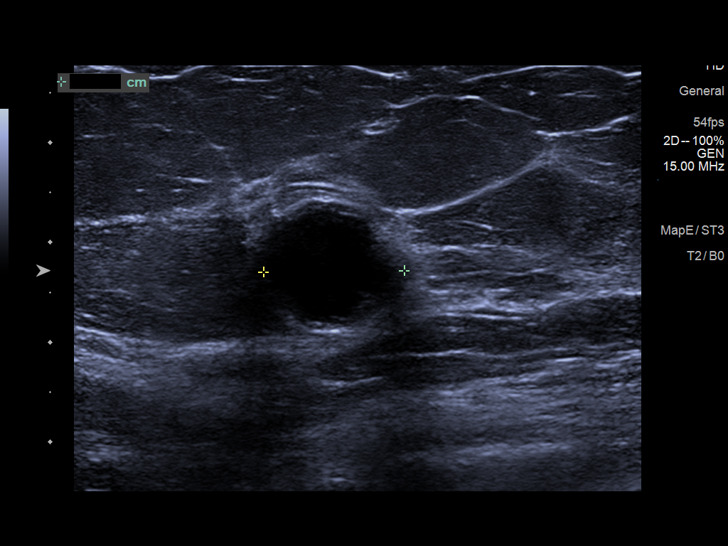

[11 of 11 positions shown; findings below may reference images not displayed]

FINDINGS: Ultrasound of the right breast at 12 o'clock, 5 cm from the nipple
demonstrates multiple benign appearing cysts, the largest measures
3.1 x 1.7 x 1.9 cm. A smaller adjacent cyst measures 1.4 x 1.2 x
cm.
IMPRESSION: The masses in the right breast correspond with benign cysts.

RECOMMENDATION:
Screening mammogram in one year.(Code:[id number])

I have discussed the findings and recommendations with the patient.
If applicable, a reminder letter will be sent to the patient
regarding the next appointment.

BI-RADS CATEGORY  2: Benign.

## 2021-05-24 ENCOUNTER — Telehealth: Payer: Self-pay | Admitting: Family Medicine

## 2021-05-24 DIAGNOSIS — I10 Essential (primary) hypertension: Secondary | ICD-10-CM

## 2021-05-24 MED ORDER — LOSARTAN POTASSIUM 100 MG PO TABS: 100.0000 mg | ORAL_TABLET | Freq: Every day | ORAL | 1 refills | Status: DC

## 2021-05-24 NOTE — Telephone Encounter (Signed)
Medication sent.

## 2021-05-24 NOTE — Telephone Encounter (Signed)
Medication Refill - Medication: losartan (COZAAR) 100 MG tablet  Pt is out of medication and asked if this can be sent today / please advise   Has the patient contacted their pharmacy? Yes.   (Agent: If no, request that the patient contact the pharmacy for the refill.) (Agent: If yes, when and what did the pharmacy advise?)call pcp  Preferred Pharmacy (with phone number or street name): since she is out she needs it to go to her local [facility], [address]  [place], [place] [address]  Phone:  [phone]  Fax:  [phone]   Agent: Please be advised that RX refills may take up to 3 business days. We ask that you follow-up with your pharmacy.

## 2021-05-24 NOTE — Telephone Encounter (Signed)
Patient called in again to inform Dr Rosanna Randy that she is completely out of her Losartan and need refill today. Please advise

## 2021-06-16 ENCOUNTER — Other Ambulatory Visit: Payer: Self-pay | Admitting: Family Medicine

## 2021-06-16 NOTE — Telephone Encounter (Signed)
Requested medications are due for refill today.  unknown  Requested medications are on the active medications list.  no  Last refill. 07/21/2020  Future visit scheduled.   yes  Notes to clinic.  Medication is not on med list. Prescribed by Dr. Rosanna Randy 07/21/2020.

## 2021-06-24 DIAGNOSIS — Z859 Personal history of malignant neoplasm, unspecified: Secondary | ICD-10-CM | POA: Diagnosis not present

## 2021-06-24 DIAGNOSIS — L570 Actinic keratosis: Secondary | ICD-10-CM | POA: Diagnosis not present

## 2021-06-24 DIAGNOSIS — L578 Other skin changes due to chronic exposure to nonionizing radiation: Secondary | ICD-10-CM | POA: Diagnosis not present

## 2021-06-24 DIAGNOSIS — L821 Other seborrheic keratosis: Secondary | ICD-10-CM | POA: Diagnosis not present

## 2021-07-13 DIAGNOSIS — H40013 Open angle with borderline findings, low risk, bilateral: Secondary | ICD-10-CM | POA: Diagnosis not present

## 2021-07-13 DIAGNOSIS — H2510 Age-related nuclear cataract, unspecified eye: Secondary | ICD-10-CM | POA: Diagnosis not present

## 2021-07-13 DIAGNOSIS — H01001 Unspecified blepharitis right upper eyelid: Secondary | ICD-10-CM | POA: Diagnosis not present

## 2021-07-13 DIAGNOSIS — H02889 Meibomian gland dysfunction of unspecified eye, unspecified eyelid: Secondary | ICD-10-CM | POA: Diagnosis not present

## 2021-07-29 ENCOUNTER — Ambulatory Visit (INDEPENDENT_AMBULATORY_CARE_PROVIDER_SITE_OTHER): Payer: Medicare HMO

## 2021-07-29 DIAGNOSIS — Z0000 Encounter for general adult medical examination without abnormal findings: Secondary | ICD-10-CM
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
## 2021-07-29 NOTE — Patient Instructions (Signed)
[name] , Thank you for taking time to come for your Medicare Wellness Visit. I appreciate your ongoing commitment to your health goals. Please review the following plan we discussed and let me know if I can assist you in the future.   Screening recommendations/referrals: Colonoscopy: 01/24/20 Mammogram: 08/27/20 Bone Density: 04/11/19 Recommended yearly ophthalmology/optometry visit for glaucoma screening and checkup Recommended yearly dental visit for hygiene and checkup  Vaccinations: Influenza vaccine: due Pneumococcal vaccine: 06/14/16  Tdap vaccine: due Shingles vaccine: 07/03/12   Covid-19:11/06/19, 12/04/19  Advanced directives: yes  Conditions/risks identified:   Next appointment: Follow up in one year for your annual wellness visit    Preventive Care 73 Years and Older, Female Preventive care refers to lifestyle choices and visits with your health care provider that can promote health and wellness. What does preventive care include? A yearly physical exam. This is also called an annual well check. Dental exams once or twice a year. Routine eye exams. Ask your health care provider how often you should have your eyes checked. Personal lifestyle choices, including: Daily care of your teeth and gums. Regular physical activity. Eating a healthy diet. Avoiding tobacco and drug use. Limiting alcohol use. Practicing safe sex. Taking low-dose aspirin every day. Taking vitamin and mineral supplements as recommended by your health care provider. What happens during an annual well check? The services and screenings done by your health care provider during your annual well check will depend on your age, overall health, lifestyle risk factors, and family history of disease. Counseling  Your health care provider may ask you questions about your: Alcohol use. Tobacco use. Drug use. Emotional well-being. Home and relationship well-being. Sexual activity. Eating  habits. History of falls. Memory and ability to understand (cognition). Work and work Statistician. Reproductive health. Screening  You may have the following tests or measurements: Height, weight, and BMI. Blood pressure. Lipid and cholesterol levels. These may be checked every 5 years, or more frequently if you are over 25 years old. Skin check. Lung cancer screening. You may have this screening every year starting at age 48 if you have a 30-pack-year history of smoking and currently smoke or have quit within the past 15 years. Fecal occult blood test (FOBT) of the stool. You may have this test every year starting at age 4. Flexible sigmoidoscopy or colonoscopy. You may have a sigmoidoscopy every 5 years or a colonoscopy every 10 years starting at age 53. Hepatitis C blood test. Hepatitis B blood test. Sexually transmitted disease (STD) testing. Diabetes screening. This is done by checking your blood sugar (glucose) after you have not eaten for a while (fasting). You may have this done every 1-3 years. Bone density scan. This is done to screen for osteoporosis. You may have this done starting at age 34. Mammogram. This may be done every 1-2 years. Talk to your health care provider about how often you should have regular mammograms. Talk with your health care provider about your test results, treatment options, and if necessary, the need for more tests. Vaccines  Your health care provider may recommend certain vaccines, such as: Influenza vaccine. This is recommended every year. Tetanus, diphtheria, and acellular pertussis (Tdap, Td) vaccine. You may need a Td booster every 10 years. Zoster vaccine. You may need this after age 50. Pneumococcal 13-valent conjugate (PCV13) vaccine. One dose is recommended after age 75. Pneumococcal polysaccharide (PPSV23) vaccine. One dose is recommended after age 46. Talk to your health care provider about which screenings and  vaccines you need and how  often you need them. This information is not intended to replace advice given to you by your health care provider. Make sure you discuss any questions you have with your health care provider. Document Released: 09/25/2015 Document Revised: 05/18/2016 Document Reviewed: 06/30/2015 Elsevier Interactive Patient Education  2017 [place] Prevention in the Home Falls can cause injuries. They can happen to people of all ages. There are many things you can do to make your home safe and to help prevent falls. What can I do on the outside of my home? Regularly fix the edges of walkways and driveways and fix any cracks. Remove anything that might make you trip as you walk through a door, such as a raised step or threshold. Trim any bushes or trees on the path to your home. Use bright outdoor lighting. Clear any walking paths of anything that might make someone trip, such as rocks or tools. Regularly check to see if handrails are loose or broken. Make sure that both sides of any steps have handrails. Any raised decks and porches should have guardrails on the edges. Have any leaves, snow, or ice cleared regularly. Use sand or salt on walking paths during winter. Clean up any spills in your garage right away. This includes oil or grease spills. What can I do in the bathroom? Use night lights. Install grab bars by the toilet and in the tub and shower. Do not use towel bars as grab bars. Use non-skid mats or decals in the tub or shower. If you need to sit down in the shower, use a plastic, non-slip stool. Keep the floor dry. Clean up any water that spills on the floor as soon as it happens. Remove soap buildup in the tub or shower regularly. Attach bath mats securely with double-sided non-slip rug tape. Do not have throw rugs and other things on the floor that can make you trip. What can I do in the bedroom? Use night lights. Make sure that you have a light by your bed that is easy to  reach. Do not use any sheets or blankets that are too big for your bed. They should not hang down onto the floor. Have a firm chair that has side arms. You can use this for support while you get dressed. Do not have throw rugs and other things on the floor that can make you trip. What can I do in the kitchen? Clean up any spills right away. Avoid walking on wet floors. Keep items that you use a lot in easy-to-reach places. If you need to reach something above you, use a strong step stool that has a grab bar. Keep electrical cords out of the way. Do not use floor polish or wax that makes floors slippery. If you must use wax, use non-skid floor wax. Do not have throw rugs and other things on the floor that can make you trip. What can I do with my stairs? Do not leave any items on the stairs. Make sure that there are handrails on both sides of the stairs and use them. Fix handrails that are broken or loose. Make sure that handrails are as long as the stairways. Check any carpeting to make sure that it is firmly attached to the stairs. Fix any carpet that is loose or worn. Avoid having throw rugs at the top or bottom of the stairs. If you do have throw rugs, attach them to the floor with carpet tape. Make  sure that you have a light switch at the top of the stairs and the bottom of the stairs. If you do not have them, ask someone to add them for you. What else can I do to help prevent falls? Wear shoes that: Do not have high heels. Have rubber bottoms. Are comfortable and fit you well. Are closed at the toe. Do not wear sandals. If you use a stepladder: Make sure that it is fully opened. Do not climb a closed stepladder. Make sure that both sides of the stepladder are locked into place. Ask someone to hold it for you, if possible. Clearly mark and make sure that you can see: Any grab bars or handrails. First and last steps. Where the edge of each step is. Use tools that help you move  around (mobility aids) if they are needed. These include: Canes. Walkers. Scooters. Crutches. Turn on the lights when you go into a dark area. Replace any light bulbs as soon as they burn out. Set up your furniture so you have a clear path. Avoid moving your furniture around. If any of your floors are uneven, fix them. If there are any pets around you, be aware of where they are. Review your medicines with your doctor. Some medicines can make you feel dizzy. This can increase your chance of falling. Ask your doctor what other things that you can do to help prevent falls. This information is not intended to replace advice given to you by your health care provider. Make sure you discuss any questions you have with your health care provider. Document Released: 06/25/2009 Document Revised: 02/04/2016 Document Reviewed: 10/03/2014 Elsevier Interactive Patient Education  2017 [employer].

## 2021-08-10 ENCOUNTER — Other Ambulatory Visit: Payer: Self-pay | Admitting: Physician Assistant

## 2021-08-10 MED ORDER — CHOLESTYRAMINE 4 G PO PACK: 4.0000 g | PACK | Freq: Every day | ORAL | 0 refills | Status: DC

## 2021-08-10 NOTE — Progress Notes (Signed)
Patient calling regarding continued diarrhea after cholecystectomy in February of 2022. On chart review, it appears she had called the office in June of this regarding the same concern and Dr Hampton Abbot had sent her Cholestyramine 4g daily in June; however, the patient had never filled this as she was concerned over the side effects. She calls today with continued diarrhea. No other complaints. She has not tried anything else.   -- I encouraged her to try over the counter imodium first given her concerns of the side effects of cholestyramine.  -- I will also reorder her cholestyramine prescription as well so she has this in case she needs it. -- She understands to call the office at [phone] if she needs anything else or fails to improve.   -- Edison Simon, PA-C [facility] 08/10/2021, 1:24 PM [phone] M-F: 7am - 4pm

## 2021-08-21 ENCOUNTER — Other Ambulatory Visit: Payer: Self-pay | Admitting: Family Medicine

## 2021-08-21 DIAGNOSIS — R0982 Postnasal drip: Secondary | ICD-10-CM

## 2021-08-21 NOTE — Telephone Encounter (Signed)
Requested Prescriptions  Pending Prescriptions Disp Refills  . fluticasone (FLONASE) 50 MCG/ACT nasal spray [Pharmacy Med Name: FLUTICASONE  SPR 50MCG RX] 48 g 2    Sig: USE 1 SPRAY IN EACH NOSTRILDAILY     Ear, Nose, and Throat: Nasal Preparations - Corticosteroids Passed - 08/21/2021 12:10 PM      Passed - Valid encounter within last 12 months    Recent Outpatient Visits          4 months ago Encounter for [employer] annual wellness exam   [facility] Jerrol Banana., MD   10 months ago Pre-op exam   [facility] [place], [place]   10 months ago [place] Jerrol Banana., MD   10 months ago Suspected COVID-19 virus infection   [facility], Dionne Bucy, MD   10 months ago Cough   [facility] Jerrol Banana., MD      Future Appointments            In 1 month Jerrol Banana., MD [facility], [place]

## 2021-09-14 ENCOUNTER — Other Ambulatory Visit: Payer: Self-pay | Admitting: Family Medicine

## 2021-09-14 DIAGNOSIS — I10 Essential (primary) hypertension: Secondary | ICD-10-CM

## 2021-09-14 DIAGNOSIS — K219 Gastro-esophageal reflux disease without esophagitis: Secondary | ICD-10-CM

## 2021-09-14 DIAGNOSIS — F3340 Major depressive disorder, recurrent, in remission, unspecified: Secondary | ICD-10-CM

## 2021-09-14 DIAGNOSIS — R059 Cough, unspecified: Secondary | ICD-10-CM

## 2021-09-20 ENCOUNTER — Other Ambulatory Visit: Payer: Self-pay | Admitting: Family Medicine

## 2021-09-20 DIAGNOSIS — K219 Gastro-esophageal reflux disease without esophagitis: Secondary | ICD-10-CM

## 2021-09-20 DIAGNOSIS — R059 Cough, unspecified: Secondary | ICD-10-CM

## 2021-09-21 NOTE — Telephone Encounter (Signed)
Requested Prescriptions  Pending Prescriptions Disp Refills   omeprazole (PRILOSEC) 20 MG capsule [Pharmacy Med Name: OMEPRAZOL RX CAP 20MG ] 180 capsule 0    Sig: TAKE 1 CAPSULE TWICE DAILY BEFORE MEALS     Gastroenterology: Proton Pump Inhibitors Passed - 09/20/2021 11:09 PM      Passed - Valid encounter within last 12 months    Recent Outpatient Visits          5 months ago Encounter for [employer] annual wellness exam   [facility] Jerrol Banana., MD   11 months ago Pre-op exam   [facility] Carles Collet [place], [place]   11 months ago [place] Jerrol Banana., MD   11 months ago Suspected COVID-19 virus infection   [facility], Dionne Bucy, MD   11 months ago Cough   [facility] [place] Jerrol Banana., MD      Future Appointments            In 2 months Jerrol Banana., MD [facility], [place]

## 2021-09-30 ENCOUNTER — Other Ambulatory Visit: Payer: Self-pay | Admitting: Family Medicine

## 2021-09-30 DIAGNOSIS — K219 Gastro-esophageal reflux disease without esophagitis: Secondary | ICD-10-CM

## 2021-09-30 DIAGNOSIS — R059 Cough, unspecified: Secondary | ICD-10-CM

## 2021-09-30 MED ORDER — OMEPRAZOLE 20 MG PO CPDR: DELAYED_RELEASE_CAPSULE | ORAL | 1 refills | Status: DC

## 2021-09-30 NOTE — Telephone Encounter (Signed)
Requested Prescriptions  Pending Prescriptions Disp Refills   omeprazole (PRILOSEC) 20 MG capsule 180 capsule 1    Sig: TAKE 1 CAPSULE TWICE DAILY BEFORE MEALS Strength: 20 mg     Gastroenterology: Proton Pump Inhibitors Passed - 09/30/2021  5:16 PM      Passed - Valid encounter within last 12 months    Recent Outpatient Visits          5 months ago Encounter for [employer] annual wellness exam   [facility] Jerrol Banana., MD   11 months ago Pre-op exam   [facility] Carles Collet [place], [place]   11 months ago [place] Jerrol Banana., MD   11 months ago Suspected COVID-19 virus infection   [facility], Dionne Bucy, MD   12 months ago Cough   [facility] Jerrol Banana., MD      Future Appointments            In 1 month Jerrol Banana., MD [facility], [place]

## 2021-09-30 NOTE — Telephone Encounter (Signed)
Medication: omeprazole (PRILOSEC) 20 MG capsule [[id number]]  Has the patient contacted their pharmacy? YES Pharmacy is calling to request if the medication can be resent. Did not receive (Agent: If no, request that the patient contact the pharmacy for the refill. If patient does not wish to contact the pharmacy document the reason why and proceed with request.) (Agent: If yes, when and what did the pharmacy advise?)  Preferred Pharmacy (with phone number or street name): [facility], [place] [address] [place] [address] Phone: [phone] Fax: [phone] Hours: Not open 24 hours   Has the patient been seen for an appointment in the last year OR does the patient have an upcoming appointment? YES 04/08/21  Agent: Please be advised that RX refills may take up to 3 business days. We ask that you follow-up with your pharmacy.

## 2021-10-11 ENCOUNTER — Ambulatory Visit: Payer: Self-pay | Admitting: Family Medicine

## 2021-11-03 ENCOUNTER — Telehealth: Payer: Self-pay | Admitting: Family Medicine

## 2021-11-03 NOTE — Telephone Encounter (Signed)
Pt would like to know if her cholestyramine (QUESTRAN) 4 g packet Comes in a capsule or tablet.  She states it make her sick on her stomach to drink this medication.  Please call her back to advise.  Protection, Rebecca Patton

## 2021-11-03 NOTE — Telephone Encounter (Signed)
Please advise 

## 2021-11-11 NOTE — Telephone Encounter (Signed)
Advised 

## 2021-11-18 ENCOUNTER — Ambulatory Visit: Payer: Self-pay | Admitting: *Deleted

## 2021-11-18 NOTE — Telephone Encounter (Signed)
Per agent ?"Pt called in says has rash, that's itching and burning, asking for something to be sent to pharmacy, said can't wait until 03/15 at already scheduled appt. Pharmacy, [facility] [phone] - [place], [address]  ?Phone: [phone]  ?Fax: [phone] " ? ? ? ?Chief Complaint: Rash ?Symptoms: rash both legs above knees, some raised areas, splotchy. Right > left. Severe itching ?Frequency: 3-4 days ago ?Pertinent Negatives: Patient denies pain, blistering, fever, joint pain ?Disposition: '[]'$ ED /'[]'$ Urgent Care (no appt availability in office) / '[x]'$ Appointment(In office/virtual)/ '[]'$  [facility]/ '[]'$ Home Care/ '[]'$ Refused Recommended Disposition /'[]'$ [facility]/ '[]'$  Follow-up with PCP ?Additional Notes: Agent made appt prior to triage for 3.15.23. Pt requesting something earlier, wants to send picture via MyChart.  After hours call, home care advise given and assusred pt NT would route to practice for PCP review.  ? ?Reason for Disposition ? Mild localized rash ? ?Answer Assessment - Initial Assessment Questions ?1. APPEARANCE of RASH: "Describe the rash."  ?    Some raised, red ?2. LOCATION: "Where is the rash located?"  ?    Both legs,top of legs above knees right > left ?3. NUMBER: "How many spots are there?"  ?    Varies ?4. SIZE: "How big are the spots?" (Inches, centimeters or compare to size of a coin)  ?    Splotchy, 3in in diameter more raised. ?5. ONSET: "When did the rash start?"  ?    3-4 days ago ?6. ITCHING: "Does the rash itch?" If Yes, ask: "How bad is the itch?"  (Scale 0-10; or none, mild, moderate, severe) ?    Itchy, severe ?7. PAIN: "Does the rash hurt?" If Yes, ask: "How bad is the pain?"  (Scale 0-10; or none, mild, moderate, severe) ?   - NONE (0): no pain ?   - MILD (1-3): doesn't interfere with normal activities  ?   - MODERATE (4-7): interferes with normal activities or awakens from sleep  ?   - SEVERE (8-10):  excruciating pain, unable to do any normal activities ?    no ?8. OTHER SYMPTOMS: "Do you have any other symptoms?" (e.g., fever) ?    no ? ?Protocols used: Rash or Redness - Localized-A-AH ? ?

## 2021-11-19 ENCOUNTER — Other Ambulatory Visit: Payer: Self-pay

## 2021-11-19 ENCOUNTER — Telehealth (INDEPENDENT_AMBULATORY_CARE_PROVIDER_SITE_OTHER): Payer: Medicare HMO | Admitting: Physician Assistant

## 2021-11-19 DIAGNOSIS — L509 Urticaria, unspecified: Secondary | ICD-10-CM

## 2021-11-19 MED ORDER — TRIAMCINOLONE ACETONIDE 0.1 % EX CREA: 1.0000 "application " | TOPICAL_CREAM | Freq: Two times a day (BID) | CUTANEOUS | 0 refills | Status: DC

## 2021-11-19 NOTE — Telephone Encounter (Signed)
Patient scheduled a my chart video visit for today with Erin Mecum ?

## 2021-11-19 NOTE — Progress Notes (Signed)
? ? ?MyChart Video Visit ? ? ? ?Virtual Visit via Video Note  ? ?This visit type was conducted due to national recommendations for restrictions regarding the COVID-19 Pandemic (e.g. social distancing) in an effort to limit this patient's exposure and mitigate transmission in our community. This patient is at least at moderate risk for complications without adequate follow up. This format is felt to be most appropriate for this patient at this time. Physical exam was limited by quality of the video and audio technology used for the visit.  ? ?Patient location: at home in [place], [place] ?Provider location: [facility], [place], [place] ? ?I discussed the limitations of evaluation and management by telemedicine and the availability of in person appointments. The patient expressed understanding and agreed to proceed. ? ?Patient: Rebecca Patton   DOB: [birth year]   76 y.o. Female  MRN: [id number] ?Visit Date: 11/19/2021 ? ?Today's healthcare provider: Dani Gobble Bell Cai, PA-C  ?Introduced myself to the patient as a Journalist, newspaper and provided education on APPs in clinical practice.  ? ? ?CC: concern for rash  ? ?Subjective  ?  ?HPI  ?Patient is concerned for rash that began Saturday morning  ?Reports that the rash is pruritic and stings when scratched ?States right leg is worse than left leg ?Denies drainage from rash or vesicles ? ? ?States that hydrocortisone cream seems to help with itching ?Denies exposure to poison ivy or oak  ?Denies changes to soaps, detergents, new clothing.  ?Denies having animals in her home or guests staying with her ?States is seems to spread if she scratches it ? ?States the only changes she has made recently are taking a fiber pill and she is weaning herself off of Gabapentin ? ? ? ?Medications: ?Outpatient Medications Prior to Visit  ?Medication Sig  ? aspirin 81 MG tablet Take 81 mg by mouth every 3 (three) days.  ? benzonatate (TESSALON) 100 MG capsule Take 1 capsule (100 mg total) by mouth 2  (two) times daily as needed for cough. (Patient not taking: Reported on 07/29/2021)  ? Bioflavonoid Products (QUERCETIN COMPLEX IMMUNE) CAPS Take 1 tablet by mouth daily. (Patient not taking: Reported on 07/29/2021)  ? cholestyramine (QUESTRAN) 4 g packet Take 1 packet (4 g total) by mouth daily with breakfast.  ? fluticasone (FLONASE) 50 MCG/ACT nasal spray USE 1 SPRAY IN EACH NOSTRILDAILY  ? gabapentin (NEURONTIN) 100 MG capsule TAKE 1 CAPSULE TWICE DAILY  ? Glycerin-Polysorbate 80 (REFRESH DRY EYE THERAPY OP) Place 1 drop into both eyes daily.  ? hydrochlorothiazide (HYDRODIURIL) 25 MG tablet TAKE 1 TABLET DAILY.  ? ibuprofen (ADVIL) 600 MG tablet Take 1 tablet (600 mg total) by mouth every 8 (eight) hours as needed for mild pain or moderate pain.  ? levothyroxine (SYNTHROID) 50 MCG tablet TAKE 1 TABLET DAILY BEFORE BREAKFAST.  ? losartan (COZAAR) 100 MG tablet Take 1 tablet (100 mg total) by mouth daily.  ? melatonin 5 MG TABS Take 5 mg by mouth daily.  ? metoprolol succinate (TOPROL-XL) 25 MG 24 hr tablet TAKE 1 TABLET DAILY. TAKE  WITH OR IMMEDIATELY        FOLLOWING A MEAL.  ? MULTIPLE VITAMIN PO Take 1 tablet by mouth daily. Centrum Silver  ? omeprazole (PRILOSEC) 20 MG capsule TAKE 1 CAPSULE TWICE DAILY BEFORE MEALS Strength: 20 mg  ? oxyCODONE (OXY IR/ROXICODONE) 5 MG immediate release tablet Take 1 tablet (5 mg total) by mouth every 4 (four) hours as needed for severe pain. (Patient not  taking: Reported on 07/29/2021)  ? sertraline (ZOLOFT) 100 MG tablet TAKE 1 TABLET DAILY  ? vitamin B-12 (CYANOCOBALAMIN) 100 MCG tablet Take 200 mcg by mouth daily.  ? zinc gluconate 50 MG tablet Take 50 mg by mouth daily.  ? ?No facility-administered medications prior to visit.  ? ? ?Review of Systems  ?Constitutional:  Negative for diaphoresis and fatigue.  ?Eyes:  Negative for discharge and itching.  ?Respiratory:  Negative for cough and shortness of breath.   ?Cardiovascular:  Negative for chest pain and palpitations.   ?Gastrointestinal:  Positive for diarrhea (chronic). Negative for nausea and vomiting.  ?Skin:  Positive for rash (on right lower thigh and knee, limited involvment on left knee and thigh).  ?Allergic/Immunologic: Negative for environmental allergies and food allergies.  ? ? ? Objective  ?  ?There were no vitals taken for this visit. ? ? ?Physical Exam ?Constitutional:   ?   General: She is awake.  ?   Appearance: Normal appearance. She is well-developed and well-groomed.  ?HENT:  ?   Head: Normocephalic and atraumatic.  ?Eyes:  ?   General: Lids are normal.  ?   Extraocular Movements: Extraocular movements intact.  ?   Conjunctiva/sclera: Conjunctivae normal.  ?Pulmonary:  ?   Effort: Pulmonary effort is normal.  ?Skin: ? ?    ?Neurological:  ?   Mental Status: She is alert.  ?Psychiatric:     ?   Behavior: Behavior is cooperative.  ?  ? ? ? Assessment & Plan  ?  ? ?Problem List Items Addressed This Visit   ?None ?Visit Diagnoses   ? ? Hives    -  Primary ?Acute, new concern ?States symptoms began Saturday with itching and stinging sensation along right lower thigh and knee with limited involvement on left leg in same location ?Denies triggering events or exposures to irritants ?Recommend the following:Keep the area clean with warm water and a gentle cleanser, dry thoroughly and apply the Kenalog cream   ?She was advised she can also use Aquaphor or Vaseline to the areas after using the Kenalog cream to help maintain skin's moisture barrier and prevent further breakdown ?Was advised to go to the ED if she develops further signs of allergic reaction that impact breathing  ?Recommend she come to office if symptoms persist and do not respond to Kenalog cream as oral steroid taper may be needed.   ? ?  ? ? ? ?No follow-ups on file. ? ? ?I, Talaysia Pinheiro E Aracelie Addis, PA-C, have reviewed all documentation for this visit. The documentation on 11/19/21 for the exam, diagnosis, procedures, and orders are all accurate and  complete. ? ? ?Brigetta Beckstrom, Glennie [place] MPH ?[place] ?[facility] ? ? ? ?No follow-ups on file.  ?  ? ?I discussed the assessment and treatment plan with the patient. The patient was provided an opportunity to ask questions and all were answered. The patient agreed with the plan and demonstrated an understanding of the instructions. ?  ?The patient was advised to call back or seek an in-person evaluation if the symptoms worsen or if the condition fails to improve as anticipated. ? ?I provided 22 minutes of non-face-to-face time during this encounter. ? ? ?Marixa Mellott E Torion Hulgan, PA-C ?[place] ?[phone] (phone) ?[phone] (fax) ? ?[facility]   ?

## 2021-11-19 NOTE — Patient Instructions (Addendum)
Based on our visit today I believe you have hives. ?I am sending in a steroid cream, Kenalog, for you to use twice per day.  ?Please avoid scratching the areas as this can damage your skin and lead to further complications ?Keep the area clean with warm water and a gentle cleanser, dry thoroughly and apply the Kenalog cream  ?You can use Aquaphor or Vaseline to the areas after using the Kenalog cream to help maintain your skin's moisture barrier and prevent further breakdown ? ? ?Let us know if your rash gets worse, spreads to other areas of your body  ?if you develop shortness of breath, trouble breathing, swelling of the tongue, lips or neck please go to the ED ?

## 2021-11-24 ENCOUNTER — Ambulatory Visit (INDEPENDENT_AMBULATORY_CARE_PROVIDER_SITE_OTHER): Payer: Medicare HMO | Admitting: Family Medicine

## 2021-11-24 ENCOUNTER — Other Ambulatory Visit: Payer: Self-pay

## 2021-11-24 ENCOUNTER — Encounter: Payer: Self-pay | Admitting: Family Medicine

## 2021-11-24 VITALS — BP 134/78 | HR 66 | Temp 98.0°F | Resp 16 | Ht 66.0 in | Wt 210.0 lb

## 2021-11-24 DIAGNOSIS — E039 Hypothyroidism, unspecified: Secondary | ICD-10-CM | POA: Diagnosis not present

## 2021-11-24 DIAGNOSIS — G629 Polyneuropathy, unspecified: Secondary | ICD-10-CM | POA: Diagnosis not present

## 2021-11-24 DIAGNOSIS — E7800 Pure hypercholesterolemia, unspecified: Secondary | ICD-10-CM

## 2021-11-24 DIAGNOSIS — L509 Urticaria, unspecified: Secondary | ICD-10-CM

## 2021-11-24 DIAGNOSIS — F3340 Major depressive disorder, recurrent, in remission, unspecified: Secondary | ICD-10-CM

## 2021-11-24 DIAGNOSIS — I10 Essential (primary) hypertension: Secondary | ICD-10-CM

## 2021-11-24 DIAGNOSIS — R69 Illness, unspecified: Secondary | ICD-10-CM | POA: Diagnosis not present

## 2021-11-24 NOTE — Progress Notes (Signed)
?  ? ? ?Established patient visit ? ?I,[name],acting as a scribe for [name], MD.,have documented all relevant documentation on the behalf of [name], MD,as directed by  [name], MD while in the presence of [name], MD. ? ? ?Patient: Rebecca Patton   DOB: 04-17-1946   76 y.o. Female  MRN: [id number] ?Visit Date: 11/24/2021 ? ?Today's healthcare provider: [name], MD  ? ?Chief Complaint  ?Patient presents with  ? Follow-up  ? Hypertension  ? Hypothyroidism  ? ?Subjective  ?  ?HPI  ?Patient comes in today for follow-up.  She is weaning gabapentin as she does not think it is helping her. ?Patient has a rash on her lower thighs that is very pruritic.  Has not responded to topical therapy with steroids. ?She is adjusting to living alone. ? ?Hypertension, follow-up ? ?BP Readings from Last 3 Encounters:  ?11/24/21 134/78  ?04/08/21 128/60  ?11/19/20 (!) 148/82  ? Wt Readings from Last 3 Encounters:  ?11/24/21 210 lb (95.3 kg)  ?04/08/21 212 lb (96.2 kg)  ?11/19/20 204 lb 6.4 oz (92.7 kg)  ?  ? ?She was last seen for hypertension 8 months ago.  ?BP at that visit was 128/60. ?Management since that visit includes; on HCTZ, losartan, and metoprolol. ?She reports good compliance with treatment. ?She is not having side effects. none ?She is not exercising. ?She is adherent to low salt diet.   ?Outside blood pressures are 130/65. ? ?She does not smoke. ? ?Use of agents associated with hypertension: none.  ? ?--------------------------------------------------------------------------------------------------- ?Hypothyroid, follow-up ? ?Lab Results  ?Component Value Date  ? TSH 5.520 (H) 04/13/2021  ? TSH 3.880 03/28/2019  ? TSH 3.220 09/27/2018  ? ? ?Wt Readings from Last 3 Encounters:  ?11/24/21 210 lb (95.3 kg)  ?04/08/21 212 lb (96.2 kg)  ?11/19/20 204 lb 6.4 oz (92.7 kg)  ? ? ?She was last seen for hypothyroid 8 months ago.  ?Management since that visit includes; on  levothyroxine 50 mcg. ?She reports good compliance with treatment. ?She is not having side effects. none ? ?----------------------------------------------------------------------------------------- ? ? ?Medications: ?Outpatient Medications Prior to Visit  ?Medication Sig  ? aspirin 81 MG tablet Take 81 mg by mouth every 3 (three) days.  ? fluticasone (FLONASE) 50 MCG/ACT nasal spray USE 1 SPRAY IN EACH NOSTRILDAILY  ? gabapentin (NEURONTIN) 100 MG capsule TAKE 1 CAPSULE TWICE DAILY  ? hydrochlorothiazide (HYDRODIURIL) 25 MG tablet TAKE 1 TABLET DAILY.  ? ibuprofen (ADVIL) 600 MG tablet Take 1 tablet (600 mg total) by mouth every 8 (eight) hours as needed for mild pain or moderate pain.  ? levothyroxine (SYNTHROID) 50 MCG tablet TAKE 1 TABLET DAILY BEFORE BREAKFAST.  ? losartan (COZAAR) 100 MG tablet Take 1 tablet (100 mg total) by mouth daily.  ? metoprolol succinate (TOPROL-XL) 25 MG 24 hr tablet TAKE 1 TABLET DAILY. TAKE  WITH OR IMMEDIATELY        FOLLOWING A MEAL.  ? MULTIPLE VITAMIN PO Take 1 tablet by mouth daily. Centrum Silver  ? omeprazole (PRILOSEC) 20 MG capsule TAKE 1 CAPSULE TWICE DAILY BEFORE MEALS Strength: 20 mg  ? sertraline (ZOLOFT) 100 MG tablet TAKE 1 TABLET DAILY  ? triamcinolone cream (KENALOG) 0.1 % Apply 1 application. topically 2 (two) times daily.  ? vitamin B-12 (CYANOCOBALAMIN) 100 MCG tablet Take 200 mcg by mouth daily.  ? [DISCONTINUED] cholestyramine (QUESTRAN) 4 g packet Take 1 packet (4 g total) by mouth daily with  breakfast. (Patient not taking: Reported on 11/24/2021)  ? [DISCONTINUED] Glycerin-Polysorbate 80 (REFRESH DRY EYE THERAPY OP) Place 1 drop into both eyes daily. (Patient not taking: Reported on 11/24/2021)  ? [DISCONTINUED] melatonin 5 MG TABS Take 5 mg by mouth daily. (Patient not taking: Reported on 11/24/2021)  ? [DISCONTINUED] zinc gluconate 50 MG tablet Take 50 mg by mouth daily. (Patient not taking: Reported on 11/24/2021)  ? ?No facility-administered medications prior  to visit.  ? ? ?Review of Systems  ?Constitutional:  Negative for appetite change, chills, fatigue and fever.  ?Respiratory:  Negative for chest tightness and shortness of breath.   ?Cardiovascular:  Negative for chest pain and palpitations.  ?Gastrointestinal:  Negative for abdominal pain, nausea and vomiting.  ?Neurological:  Negative for dizziness and weakness.  ? ?  ?  Objective  ?  ?BP 134/78 (BP Location: Left Arm, Patient Position: Sitting, Cuff Size: Large)   Pulse 66   Temp 98 ?F (36.7 ?C) (Temporal)   Resp 16   Ht '5\' 6"'$  (1.676 m)   Wt 210 lb (95.3 kg)   SpO2 95%   BMI 33.89 kg/m?  ?BP Readings from Last 3 Encounters:  ?11/24/21 134/78  ?04/08/21 128/60  ?11/19/20 (!) 148/82  ? ?Wt Readings from Last 3 Encounters:  ?11/24/21 210 lb (95.3 kg)  ?04/08/21 212 lb (96.2 kg)  ?11/19/20 204 lb 6.4 oz (92.7 kg)  ? ?  ? ?Physical Exam ?Vitals reviewed.  ?Constitutional:   ?   Appearance: She is well-developed.  ?HENT:  ?   Head: Normocephalic and atraumatic.  ?   Right Ear: External ear normal.  ?   Left Ear: External ear normal.  ?   Nose: Nose normal.  ?Eyes:  ?   General: No scleral icterus. ?   Conjunctiva/sclera: Conjunctivae normal.  ?Neck:  ?   Thyroid: No thyromegaly.  ?Cardiovascular:  ?   Rate and Rhythm: Normal rate and regular rhythm.  ?   Heart sounds: Normal heart sounds.  ?Pulmonary:  ?   Effort: Pulmonary effort is normal.  ?   Breath sounds: Normal breath sounds.  ?Abdominal:  ?   General: There is no distension.  ?   Palpations: Abdomen is soft.  ?   Tenderness: There is no abdominal tenderness.  ?Skin: ?   General: Skin is warm and dry.  ?   Comments: Rash just above the knees to about halfway up the thighs that is erythematous and blanches.  ?Neurological:  ?   General: No focal deficit present.  ?   Mental Status: She is alert and oriented to person, place, and time.  ?Psychiatric:     ?   Mood and Affect: Mood normal.     ?   Behavior: Behavior normal.     ?   Thought Content: Thought  content normal.     ?   Judgment: Judgment normal.  ?  ? ? ?No results found for any visits on 11/24/21. ? Assessment & Plan  ?  ? ?1. Essential hypertension ?Controlled. ?- Lipid panel ?- TSH ?- CBC w/Diff/Platelet ?- Comprehensive Metabolic Panel (CMET) ? ?2. Pure hypercholesterolemia ? ?- Lipid panel ?- TSH ?- CBC w/Diff/Platelet ?- Comprehensive Metabolic Panel (CMET) ? ?3. Adult hypothyroidism ?Treat TSH to euthyroid ?- Lipid panel ?- TSH ?- CBC w/Diff/Platelet ?- Comprehensive Metabolic Panel (CMET) ? ?4. Hives ?She has been treated unsuccessfully.  Refer back to Dr. Phillip Heal from dermatology. ?5.  Depression ?Continue sertraline indefinitely for now. ?6.  Neuropathy ?Patient plans to wean her gabapentin and I am fine with this as she seems to be at minimal relief. ?Symptoms not severe at this time. ?No follow-ups on file.  ?   ? ?I, [name], MD, have reviewed all documentation for this visit. The documentation on 11/27/21 for the exam, diagnosis, procedures, and orders are all accurate and complete. ? ? ? ?Bula Cavalieri Cranford Mon, MD  ?[facility] ?[phone] (phone) ?[phone] (fax) ? ?[facility] ?

## 2021-11-25 DIAGNOSIS — L2389 Allergic contact dermatitis due to other agents: Secondary | ICD-10-CM | POA: Diagnosis not present

## 2021-11-26 ENCOUNTER — Other Ambulatory Visit: Payer: Self-pay | Admitting: Family Medicine

## 2021-11-26 DIAGNOSIS — I10 Essential (primary) hypertension: Secondary | ICD-10-CM

## 2021-11-26 NOTE — Telephone Encounter (Signed)
Requested medication (s) are due for refill today: yes ? ?Requested medication (s) are on the active medication list: yes ? ?Last refill:  05/24/21 #90/1 ? ?Future visit scheduled: yes ? ?Notes to clinic:  Unable to refill per protocol due to failed labs, no updated results.  ? ? ?  ?Requested Prescriptions  ?Pending Prescriptions Disp Refills  ? losartan (COZAAR) 100 MG tablet [Pharmacy Med Name: LOSARTAN '100MG'$  TABLETS] 90 tablet 1  ?  Sig: TAKE 1 TABLET(100 MG) BY MOUTH DAILY  ?  ? Cardiovascular:  Angiotensin Receptor Blockers Failed - 11/26/2021  1:29 PM  ?  ?  Failed - Cr in normal range and within 180 days  ?  Creatinine  ?Date Value Ref Range Status  ?03/25/2012 0.89 0.60 - 1.30 mg/dL Final  ? ?Creatinine, Ser  ?Date Value Ref Range Status  ?04/13/2021 0.96 0.57 - 1.00 mg/dL Final  ?  ?  ?  ?  Failed - K in normal range and within 180 days  ?  Potassium  ?Date Value Ref Range Status  ?04/13/2021 3.9 3.5 - 5.2 mmol/L Final  ?03/25/2012 3.6 3.5 - 5.1 mmol/L Final  ?  ?  ?  ?  Passed - Patient is not pregnant  ?  ?  Passed - Last BP in normal range  ?  BP Readings from Last 1 Encounters:  ?11/24/21 134/78  ?  ?  ?  ?  Passed - Valid encounter within last 6 months  ?  Recent Outpatient Visits   ? ?      ? 2 days ago Essential hypertension  ? [facility] Jerrol Banana., MD  ? 1 week ago Hives  ? [place], PA-C  ? 7 months ago Encounter for [employer] annual wellness exam  ? [facility] Jerrol Banana., MD  ? 1 year ago Pre-op exam  ? [facility] Carles Collet [place], [place]  ? 1 year ago Gallstones  ? [facility] Jerrol Banana., MD  ? ?  ?  ?Future Appointments   ? ?        ? In 6 months Jerrol Banana., MD [facility]  ? ?  ? ?  ?  ?  ? ?

## 2022-03-09 ENCOUNTER — Other Ambulatory Visit: Payer: Self-pay | Admitting: Family Medicine

## 2022-03-09 DIAGNOSIS — I10 Essential (primary) hypertension: Secondary | ICD-10-CM

## 2022-03-09 MED ORDER — LOSARTAN POTASSIUM 100 MG PO TABS: ORAL_TABLET | ORAL | 0 refills | Status: DC

## 2022-03-09 NOTE — Telephone Encounter (Signed)
Medication Refill - Medication:   losartan (COZAAR) 100 MG tablet   Has the patient contacted their pharmacy? No. Pt wanted to use a different pharmacy.  (Agent: If no, request that the patient contact the pharmacy for the refill. If patient does not wish to contact the pharmacy document the reason why and proceed with request.) (Agent: If yes, when and what did the pharmacy advise?)  Preferred Pharmacy (with phone number or street name):   [facility] [address] [place] [address]  Phone: [phone] Fax: [phone]   Has the patient been seen for an appointment in the last year OR does the patient have an upcoming appointment? Yes.    Agent: Please be advised that RX refills may take up to 3 business days. We ask that you follow-up with your pharmacy.

## 2022-03-09 NOTE — Telephone Encounter (Signed)
Change in pharmacy - remainder of Rx sent to requested pharmacy Requested Prescriptions  Pending Prescriptions Disp Refills  . losartan (COZAAR) 100 MG tablet 90 tablet 1     Cardiovascular:  Angiotensin Receptor Blockers Failed - 03/09/2022  4:28 PM      Failed - Cr in normal range and within 180 days    Creatinine  Date Value Ref Range Status  03/25/2012 0.89 0.60 - 1.30 mg/dL Final   Creatinine, Ser  Date Value Ref Range Status  04/13/2021 0.96 0.57 - 1.00 mg/dL Final         Failed - K in normal range and within 180 days    Potassium  Date Value Ref Range Status  04/13/2021 3.9 3.5 - 5.2 mmol/L Final  03/25/2012 3.6 3.5 - 5.1 mmol/L Final         Passed - Patient is not pregnant      Passed - Last BP in normal range    BP Readings from Last 1 Encounters:  11/24/21 134/78         Passed - Valid encounter within last 6 months    Recent Outpatient Visits          3 months ago Essential hypertension   [facility] Jerrol Banana., MD   3 months ago [employer], Dani Gobble, PA-C   11 months ago Encounter for [employer] annual wellness exam   [facility] Jerrol Banana., MD   1 year ago Pre-op exam   [facility] [place], [place]   1 year ago [place] Jerrol Banana., MD      Future Appointments            In 2 months Jerrol Banana., MD [facility], [place]

## 2022-04-19 ENCOUNTER — Other Ambulatory Visit: Payer: Self-pay | Admitting: Family Medicine

## 2022-04-19 DIAGNOSIS — R059 Cough, unspecified: Secondary | ICD-10-CM

## 2022-04-19 DIAGNOSIS — K219 Gastro-esophageal reflux disease without esophagitis: Secondary | ICD-10-CM

## 2022-05-12 DIAGNOSIS — L2389 Allergic contact dermatitis due to other agents: Secondary | ICD-10-CM | POA: Diagnosis not present

## 2022-05-30 ENCOUNTER — Encounter: Payer: Medicare HMO | Admitting: Family Medicine

## 2022-05-30 NOTE — Progress Notes (Deleted)
Annual Wellness Visit     Patient: Rebecca Patton, Female    DOB: [birth year], 76 y.o.   MRN: [id number] Visit Date: 05/30/2022  Today's Provider: [name], MD   No chief complaint on file.  Subjective    Rebecca Patton is a 76 y.o. female who presents today for her Annual Wellness Visit. She reports consuming a {diet types:17450} diet. {Exercise:19826} She generally feels {well/fairly well/poorly:18703}. She reports sleeping {well/fairly well/poorly:18703}. She {does/does not:200015} have additional problems to discuss today.   HPI    Medications: Outpatient Medications Prior to Visit  Medication Sig   aspirin 81 MG tablet Take 81 mg by mouth every 3 (three) days.   fluticasone (FLONASE) 50 MCG/ACT nasal spray USE 1 SPRAY IN EACH NOSTRILDAILY   gabapentin (NEURONTIN) 100 MG capsule TAKE 1 CAPSULE TWICE DAILY   hydrochlorothiazide (HYDRODIURIL) 25 MG tablet TAKE 1 TABLET DAILY.   ibuprofen (ADVIL) 600 MG tablet Take 1 tablet (600 mg total) by mouth every 8 (eight) hours as needed for mild pain or moderate pain.   levothyroxine (SYNTHROID) 50 MCG tablet TAKE 1 TABLET DAILY BEFORE BREAKFAST.   losartan (COZAAR) 100 MG tablet TAKE 1 TABLET(100 MG) BY MOUTH DAILY   metoprolol succinate (TOPROL-XL) 25 MG 24 hr tablet TAKE 1 TABLET DAILY. TAKE  WITH OR IMMEDIATELY        FOLLOWING A MEAL.   MULTIPLE VITAMIN PO Take 1 tablet by mouth daily. Centrum Silver   omeprazole (PRILOSEC) 20 MG capsule TAKE 1 CAPSULE TWICE DAILY BEFORE MEALS   sertraline (ZOLOFT) 100 MG tablet TAKE 1 TABLET DAILY   triamcinolone cream (KENALOG) 0.1 % Apply 1 application. topically 2 (two) times daily.   vitamin B-12 (CYANOCOBALAMIN) 100 MCG tablet Take 200 mcg by mouth daily.   No facility-administered medications prior to visit.    No Known Allergies  Patient Care Team: Jerrol Banana., MD as PCP - General (Family Medicine) Anell Barr, OD as Consulting Physician  (Optometry) [place], [place], CNM (Inactive) as Midwife (Obstetrics and Gynecology) Ree Edman, MD (Dermatology) Efrain Sella, MD as Consulting Physician (Gastroenterology)  Review of Systems  All other systems reviewed and are negative.   {Labs  Heme  Chem  Endocrine  Serology  Results Review (optional):23779}    Objective    Vitals: There were no vitals taken for this visit. {Show previous vital signs (optional):23777}   Physical Exam ***  Most recent functional status assessment:    07/29/2021   11:14 AM  In your present state of health, do you have any difficulty performing the following activities:  Hearing? 0  Vision? 0  Difficulty concentrating or making decisions? 0  Walking or climbing stairs? 0  Dressing or bathing? 0  Doing errands, shopping? 0  Preparing Food and eating ? N  Using the Toilet? N  In the past six months, have you accidently leaked urine? N  Do you have problems with loss of bowel control? N  Managing your Medications? N  Managing your Finances? N  Housekeeping or managing your Housekeeping? N   Most recent fall risk assessment:    07/29/2021   11:13 AM  Fall Risk   Falls in the past year? 0  Number falls in past yr: 0  Injury with Fall? 0  Risk for fall due to : No Fall Risks  Follow up Falls prevention discussed    Most recent depression screenings:    07/29/2021   11:13  AM 07/29/2021   11:08 AM  PHQ 2/9 Scores  PHQ - 2 Score 0 0   Most recent cognitive screening:     No data to display         Most recent Audit-C alcohol use screening    07/29/2021   11:06 AM  Alcohol Use Disorder Test (AUDIT)  1. How often do you have a drink containing alcohol? 0  2. How many drinks containing alcohol do you have on a typical day when you are drinking? 0  3. How often do you have six or more drinks on one occasion? 0  AUDIT-C Score 0   A score of 3 or more in women, and 4 or more in men indicates increased  risk for alcohol abuse, EXCEPT if all of the points are from question 1   No results found for any visits on 05/30/22.  Assessment & Plan     Annual wellness visit done today including the all of the following: Reviewed patient's Family Medical History Reviewed and updated list of patient's medical providers Assessment of cognitive impairment was done Assessed patient's functional ability Established a written schedule for health screening services Health Risk Assessent Completed and Reviewed  Exercise Activities and Dietary recommendations  Goals      DIET - EAT MORE FRUITS AND VEGETABLES     Exercise 3x per week (30 min per time)     Recommend exercising 3 days a week for at least 30 minutes. Pt to start swimming in Spring.          Immunization History  Administered Date(s) Administered   Fluad Quad(high Dose 65+) 07/21/2020   Influenza Split 07/03/2012   Influenza, High Dose Seasonal PF 07/03/2018, 06/26/2019   Influenza, Seasonal, Injecte, Preservative Fre 06/20/2017   Influenza,inj,Quad PF,6+ Mos 08/13/2013   Influenza,inj,quad, With Preservative 06/29/2019, 07/04/2019   Influenza-Unspecified 06/13/2015   Moderna Sars-Covid-2 Vaccination 11/06/2019, 12/04/2019   Pneumococcal Conjugate-13 08/13/2013   Pneumococcal Polysaccharide-23 06/14/2016   Zoster, Live 07/03/2012    Health Maintenance  Topic Date Due   TETANUS/TDAP  Never done   Zoster Vaccines- Shingrix (1 of 2) Never done   COVID-19 Vaccine (3 - Moderna risk series) 01/01/2020   INFLUENZA VACCINE  04/12/2022   MAMMOGRAM  08/27/2022   DEXA SCAN  04/10/2024   COLONOSCOPY (Pts 45-14yr Insurance coverage will need to be confirmed)  01/23/2030   Pneumonia Vaccine 76 Years old  Completed   Hepatitis C Screening  Completed   HPV VACCINES  Aged Out     Discussed health benefits of physical activity, and encouraged her to engage in regular exercise appropriate for her age and condition.    ***  No  follow-ups on file.     {provider attestation***:1}   RWilhemena Durie MD  [phone](phone) [phone](fax)  [place]

## 2022-06-07 ENCOUNTER — Ambulatory Visit (INDEPENDENT_AMBULATORY_CARE_PROVIDER_SITE_OTHER): Payer: Medicare HMO | Admitting: Physician Assistant

## 2022-06-07 ENCOUNTER — Encounter: Payer: Self-pay | Admitting: Physician Assistant

## 2022-06-07 VITALS — BP 134/60 | HR 66 | Ht 66.0 in | Wt 209.4 lb

## 2022-06-07 DIAGNOSIS — E7800 Pure hypercholesterolemia, unspecified: Secondary | ICD-10-CM | POA: Diagnosis not present

## 2022-06-07 DIAGNOSIS — Z0000 Encounter for general adult medical examination without abnormal findings: Secondary | ICD-10-CM

## 2022-06-07 DIAGNOSIS — E039 Hypothyroidism, unspecified: Secondary | ICD-10-CM

## 2022-06-07 DIAGNOSIS — Z1231 Encounter for screening mammogram for malignant neoplasm of breast: Secondary | ICD-10-CM | POA: Diagnosis not present

## 2022-06-07 DIAGNOSIS — R739 Hyperglycemia, unspecified: Secondary | ICD-10-CM | POA: Diagnosis not present

## 2022-06-07 DIAGNOSIS — F3342 Major depressive disorder, recurrent, in full remission: Secondary | ICD-10-CM | POA: Diagnosis not present

## 2022-06-07 DIAGNOSIS — I10 Essential (primary) hypertension: Secondary | ICD-10-CM

## 2022-06-07 DIAGNOSIS — Z23 Encounter for immunization: Secondary | ICD-10-CM | POA: Diagnosis not present

## 2022-06-07 DIAGNOSIS — R69 Illness, unspecified: Secondary | ICD-10-CM | POA: Diagnosis not present

## 2022-06-07 MED ORDER — SERTRALINE HCL 50 MG PO TABS: 50.0000 mg | ORAL_TABLET | Freq: Every day | ORAL | 1 refills | Status: DC

## 2022-06-07 NOTE — Assessment & Plan Note (Addendum)
Chronic and well controlled on medication Reviewed home values F/u 6 mo

## 2022-06-07 NOTE — Assessment & Plan Note (Signed)
Pt has been taking Zoloft for 2 years, feels stable, wants to start weaning off. Advised to cut to 50 mg, new rx sent for at least two weeks, and then 25 mg for at least two weeks, 1/2 25 mg tab for two weeks and then alternating for a few days before stopping. Advised if she wanted to cut in half and stay for a few weeks/months to see how she feels this would be okay as well

## 2022-06-07 NOTE — Assessment & Plan Note (Signed)
unmedicated will repeat fasting lipids for stability

## 2022-06-07 NOTE — Assessment & Plan Note (Signed)
Managed with levothyroxine 50 mg  Will check tsh/t4
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
## 2022-06-13 DIAGNOSIS — R739 Hyperglycemia, unspecified: Secondary | ICD-10-CM | POA: Diagnosis not present

## 2022-06-13 DIAGNOSIS — E039 Hypothyroidism, unspecified: Secondary | ICD-10-CM | POA: Diagnosis not present

## 2022-06-13 DIAGNOSIS — E7800 Pure hypercholesterolemia, unspecified: Secondary | ICD-10-CM | POA: Diagnosis not present

## 2022-06-13 DIAGNOSIS — I10 Essential (primary) hypertension: Secondary | ICD-10-CM | POA: Diagnosis not present

## 2022-06-14 ENCOUNTER — Other Ambulatory Visit: Payer: Self-pay | Admitting: Physician Assistant

## 2022-06-14 DIAGNOSIS — E039 Hypothyroidism, unspecified: Secondary | ICD-10-CM

## 2022-06-14 LAB — LIPID PANEL
Chol/HDL Ratio: 3.6 ratio (ref 0.0–4.4)
Cholesterol, Total: 208 mg/dL — ABNORMAL HIGH (ref 100–199)
HDL: 58 mg/dL (ref >39)
LDL Chol Calc (NIH): 122 mg/dL — ABNORMAL HIGH (ref 0–99)
Triglycerides: 157 mg/dL — ABNORMAL HIGH (ref 0–149)
VLDL Cholesterol Cal: 28 mg/dL (ref 5–40)

## 2022-06-14 LAB — CBC WITH DIFFERENTIAL/PLATELET
Basophils Absolute: 0 10*3/uL (ref 0.0–0.2)
Basos: 1 %
EOS (ABSOLUTE): 0.3 10*3/uL (ref 0.0–0.4)
Eos: 4 %
Hematocrit: 42.2 % (ref 34.0–46.6)
Hemoglobin: 13.8 g/dL (ref 11.1–15.9)
Immature Grans (Abs): 0 10*3/uL (ref 0.0–0.1)
Immature Granulocytes: 0 %
Lymphocytes Absolute: 1.5 10*3/uL (ref 0.7–3.1)
Lymphs: 21 %
MCH: 28.5 pg (ref 26.6–33.0)
MCHC: 32.7 g/dL (ref 31.5–35.7)
MCV: 87 fL (ref 79–97)
Monocytes Absolute: 0.5 10*3/uL (ref 0.1–0.9)
Monocytes: 7 %
Neutrophils Absolute: 4.7 10*3/uL (ref 1.4–7.0)
Neutrophils: 67 %
Platelets: 210 10*3/uL (ref 150–450)
RBC: 4.85 x10E6/uL (ref 3.77–5.28)
RDW: 13.9 % (ref 11.7–15.4)
WBC: 7.1 10*3/uL (ref 3.4–10.8)

## 2022-06-14 LAB — HEMOGLOBIN A1C
Est. average glucose Bld gHb Est-mCnc: 128 mg/dL
Hgb A1c MFr Bld: 6.1 % — ABNORMAL HIGH (ref 4.8–5.6)

## 2022-06-14 LAB — TSH+FREE T4
Free T4: 1.02 ng/dL (ref 0.82–1.77)
TSH: 5.42 u[IU]/mL — ABNORMAL HIGH (ref 0.450–4.500)

## 2022-06-14 LAB — COMPREHENSIVE METABOLIC PANEL
ALT: 15 IU/L (ref 0–32)
AST: 22 IU/L (ref 0–40)
Albumin/Globulin Ratio: 1.6 (ref 1.2–2.2)
Albumin: 3.9 g/dL (ref 3.8–4.8)
Alkaline Phosphatase: 86 IU/L (ref 44–121)
BUN/Creatinine Ratio: 17 (ref 12–28)
BUN: 16 mg/dL (ref 8–27)
Bilirubin Total: 0.3 mg/dL (ref 0.0–1.2)
CO2: 26 mmol/L (ref 20–29)
Calcium: 9.1 mg/dL (ref 8.7–10.3)
Chloride: 104 mmol/L (ref 96–106)
Creatinine, Ser: 0.94 mg/dL (ref 0.57–1.00)
Globulin, Total: 2.5 g/dL (ref 1.5–4.5)
Glucose: 112 mg/dL — ABNORMAL HIGH (ref 70–99)
Potassium: 3.8 mmol/L (ref 3.5–5.2)
Sodium: 145 mmol/L — ABNORMAL HIGH (ref 134–144)
Total Protein: 6.4 g/dL (ref 6.0–8.5)
eGFR: 64 mL/min/{1.73_m2} (ref >59)

## 2022-06-14 MED ORDER — LEVOTHYROXINE SODIUM 75 MCG PO TABS: 75.0000 ug | ORAL_TABLET | Freq: Every day | ORAL | 1 refills | Status: DC

## 2022-06-14 NOTE — Progress Notes (Signed)
Noted ty

## 2022-06-28 DIAGNOSIS — L2389 Allergic contact dermatitis due to other agents: Secondary | ICD-10-CM | POA: Diagnosis not present

## 2022-06-28 DIAGNOSIS — L570 Actinic keratosis: Secondary | ICD-10-CM | POA: Diagnosis not present

## 2022-06-28 DIAGNOSIS — L821 Other seborrheic keratosis: Secondary | ICD-10-CM | POA: Diagnosis not present

## 2022-06-28 DIAGNOSIS — L578 Other skin changes due to chronic exposure to nonionizing radiation: Secondary | ICD-10-CM | POA: Diagnosis not present

## 2022-06-28 DIAGNOSIS — Z859 Personal history of malignant neoplasm, unspecified: Secondary | ICD-10-CM | POA: Diagnosis not present

## 2022-06-28 DIAGNOSIS — Z872 Personal history of diseases of the skin and subcutaneous tissue: Secondary | ICD-10-CM | POA: Diagnosis not present

## 2022-07-18 DIAGNOSIS — L2389 Allergic contact dermatitis due to other agents: Secondary | ICD-10-CM | POA: Diagnosis not present

## 2022-07-29 ENCOUNTER — Telehealth: Payer: Self-pay | Admitting: Physician Assistant

## 2022-07-29 DIAGNOSIS — E039 Hypothyroidism, unspecified: Secondary | ICD-10-CM

## 2022-07-29 DIAGNOSIS — R0982 Postnasal drip: Secondary | ICD-10-CM

## 2022-07-29 DIAGNOSIS — I10 Essential (primary) hypertension: Secondary | ICD-10-CM

## 2022-07-29 MED ORDER — LOSARTAN POTASSIUM 100 MG PO TABS: ORAL_TABLET | ORAL | 1 refills | Status: AC

## 2022-07-29 MED ORDER — FLUTICASONE PROPIONATE 50 MCG/ACT NA SUSP: NASAL | 2 refills | Status: AC

## 2022-07-29 NOTE — Telephone Encounter (Signed)
HCTZ LRF 09/14/21 #90 3RF Metoprolol LRF 09/14/21 #90 3RF Levothyroxine LRF 06/14/22 #90 1RF Losartan LRF 03/09/22 #90 0RF  LOV 05/2022

## 2022-07-29 NOTE — Telephone Encounter (Addendum)
[facility] faxed refill request for the following medications:   losartan (COZAAR) 100 MG tablet    metoprolol succinate (TOPROL-XL) 25 MG 24 hr tablet     hydrochlorothiazide (HYDRODIURIL) 25 MG tablet     levothyroxine (SYNTHROID) 50 MCG tablet    fluticasone (FLONASE) 50 MCG/ACT nasal spray   Please advise.

## 2022-08-02 IMAGING — CR DG CHEST 2V
1 series · 2 of 2 positions shown · non-contrast
Comparison: 12/20/2019

CLINICAL DATA: 74-year-old female with presumed COVID pneumonia

EXAM:
CHEST - 2 VIEW

[Series 1: dg chest 2 view · 0.14mm/px · 2 of 2 slices shown]
[im 1/2]
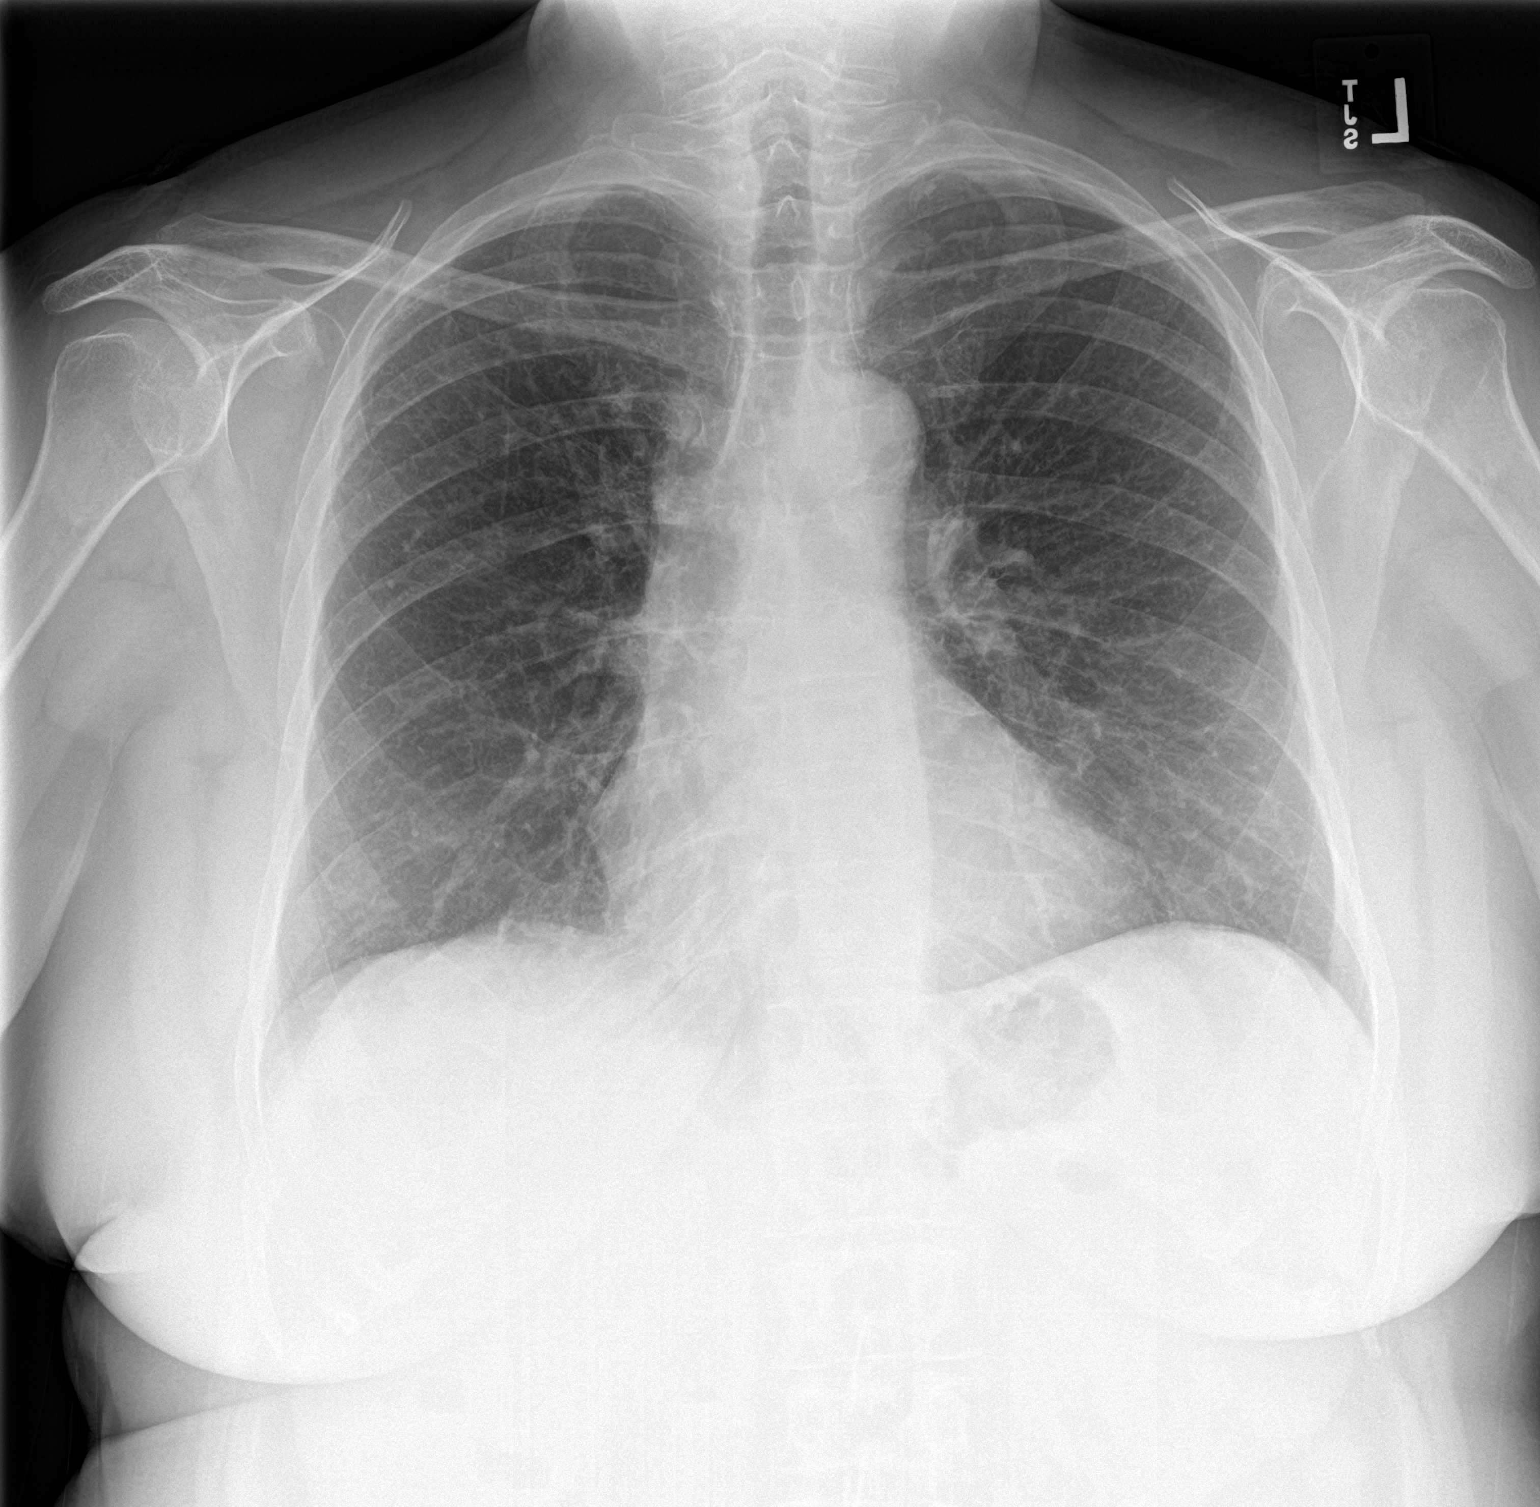
[im 2/2]
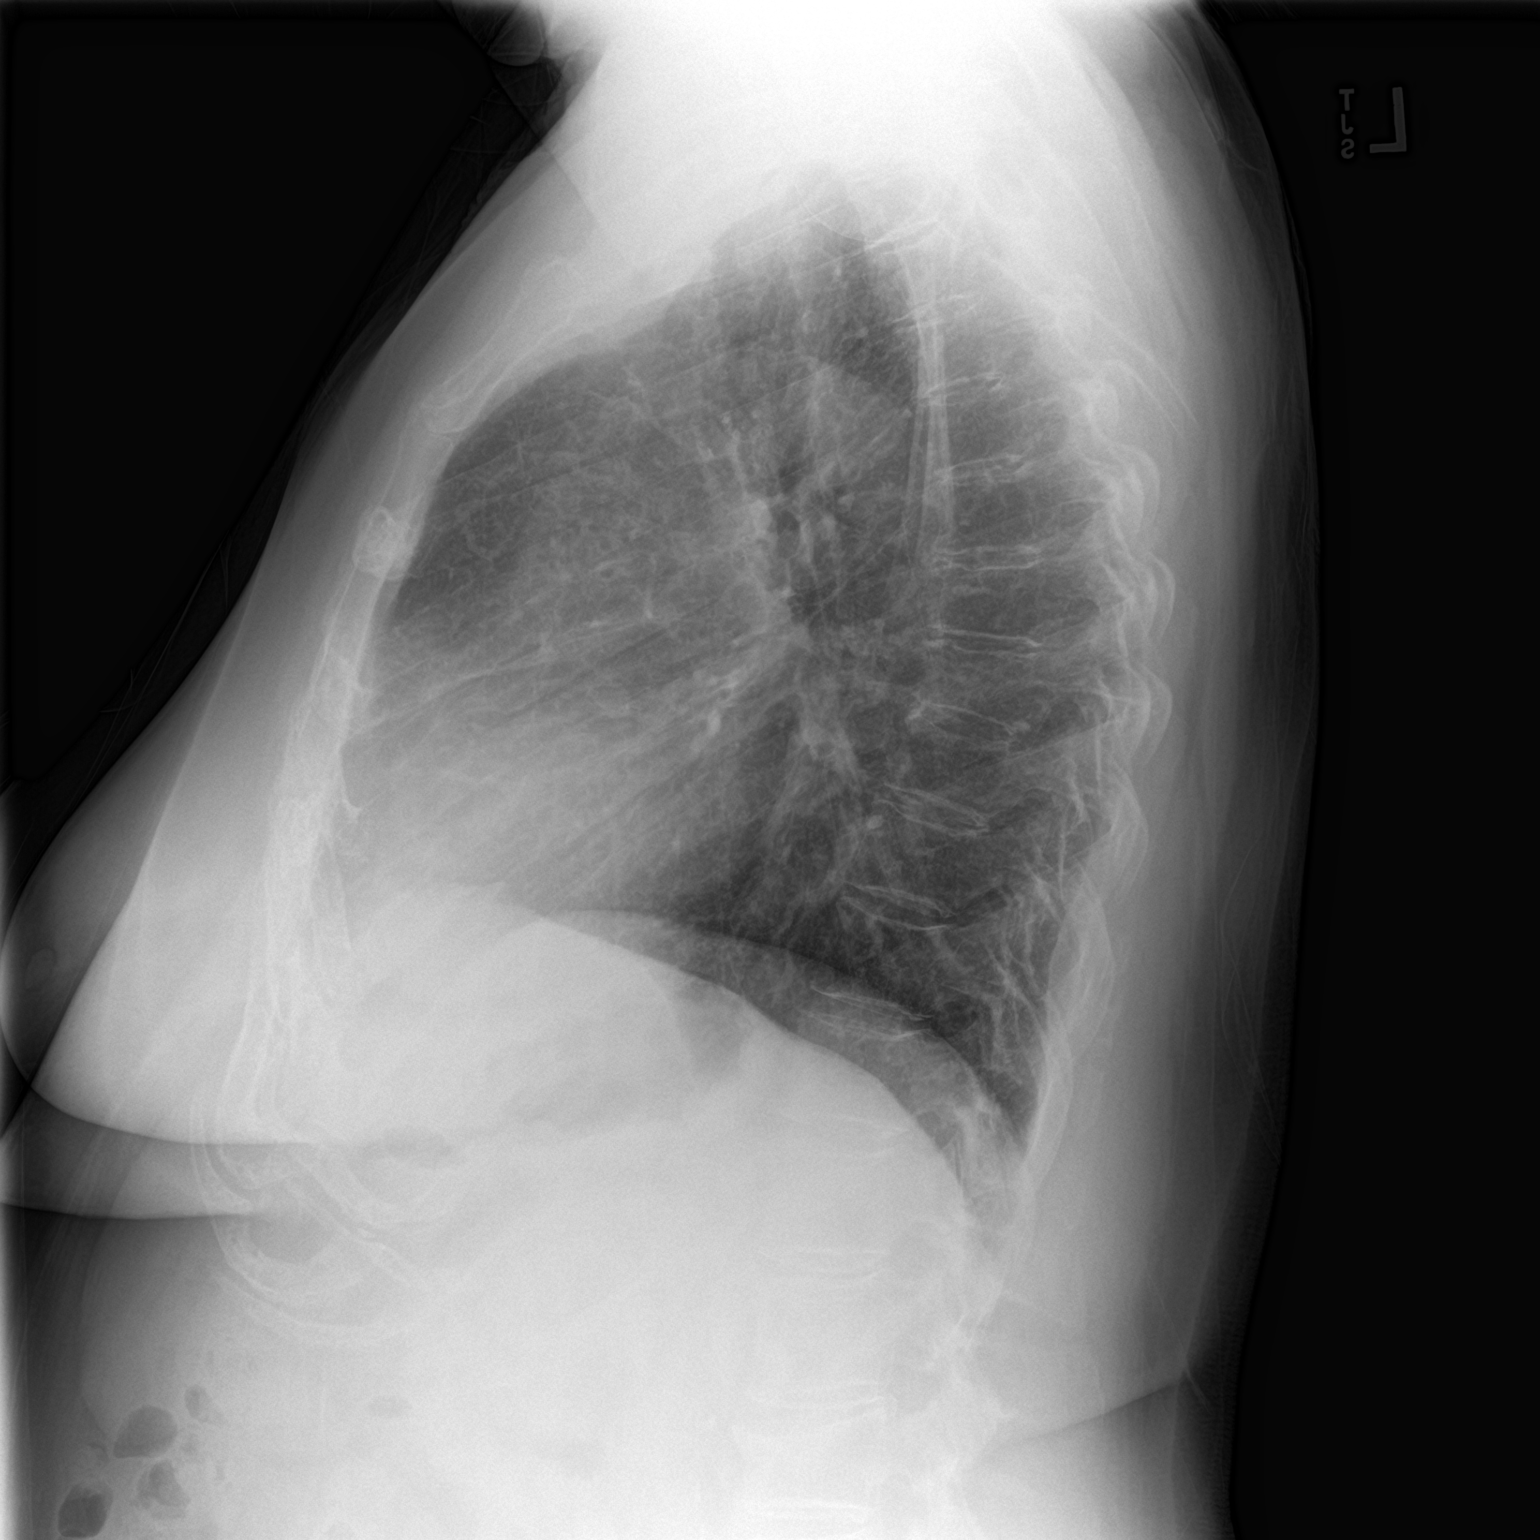

[2 of 2 positions shown; findings below may reference images not displayed]

FINDINGS: Cardiomediastinal silhouette unchanged in size and contour. No
evidence of central vascular congestion. No interlobular septal
thickening. No pneumothorax or pleural effusion. No confluent
airspace disease.

Coarsened interstitial markings are similar to the comparison.

No displaced fracture.  Degenerative changes of the spine.
IMPRESSION: Mild chronic changes without evidence of acute cardiopulmonary
disease

## 2022-08-22 DIAGNOSIS — E785 Hyperlipidemia, unspecified: Secondary | ICD-10-CM | POA: Diagnosis not present

## 2022-08-22 DIAGNOSIS — M1990 Unspecified osteoarthritis, unspecified site: Secondary | ICD-10-CM | POA: Diagnosis not present

## 2022-08-22 DIAGNOSIS — Z6831 Body mass index (BMI) 31.0-31.9, adult: Secondary | ICD-10-CM | POA: Diagnosis not present

## 2022-08-22 DIAGNOSIS — Z833 Family history of diabetes mellitus: Secondary | ICD-10-CM | POA: Diagnosis not present

## 2022-08-22 DIAGNOSIS — G629 Polyneuropathy, unspecified: Secondary | ICD-10-CM | POA: Diagnosis not present

## 2022-08-22 DIAGNOSIS — J309 Allergic rhinitis, unspecified: Secondary | ICD-10-CM | POA: Diagnosis not present

## 2022-08-22 DIAGNOSIS — Z87891 Personal history of nicotine dependence: Secondary | ICD-10-CM | POA: Diagnosis not present

## 2022-08-22 DIAGNOSIS — I10 Essential (primary) hypertension: Secondary | ICD-10-CM | POA: Diagnosis not present

## 2022-08-22 DIAGNOSIS — Z8249 Family history of ischemic heart disease and other diseases of the circulatory system: Secondary | ICD-10-CM | POA: Diagnosis not present

## 2022-08-22 DIAGNOSIS — Z809 Family history of malignant neoplasm, unspecified: Secondary | ICD-10-CM | POA: Diagnosis not present

## 2022-08-22 DIAGNOSIS — K219 Gastro-esophageal reflux disease without esophagitis: Secondary | ICD-10-CM | POA: Diagnosis not present

## 2022-08-22 DIAGNOSIS — E039 Hypothyroidism, unspecified: Secondary | ICD-10-CM | POA: Diagnosis not present

## 2022-10-11 ENCOUNTER — Other Ambulatory Visit: Payer: Self-pay | Admitting: Physician Assistant

## 2022-10-11 DIAGNOSIS — E039 Hypothyroidism, unspecified: Secondary | ICD-10-CM

## 2022-10-14 ENCOUNTER — Encounter: Payer: Self-pay | Admitting: Physician Assistant

## 2022-11-14 ENCOUNTER — Other Ambulatory Visit: Payer: Self-pay | Admitting: Physician Assistant

## 2022-11-14 ENCOUNTER — Telehealth: Payer: Self-pay | Admitting: Physician Assistant

## 2022-11-14 DIAGNOSIS — I10 Essential (primary) hypertension: Secondary | ICD-10-CM

## 2022-11-14 NOTE — Telephone Encounter (Signed)
Contacted patient to verify and she states that she was going to further discuss at her next visit because she is actually wanting to stay with Dr.Gilbert. Advised he is now at [facility] and she would have to contact that office to see how soon she could be seen but we are just needing to verify if she has established care elsewhere. Patient did state she only has 2 days remaining of her metoprolol and needing a refill   Question never answered

## 2022-11-14 NOTE — Telephone Encounter (Signed)
[facility] is asking for refills on Metoprolol succinate ER 25 mg.

## 2022-11-15 MED ORDER — METOPROLOL SUCCINATE ER 25 MG PO TB24: ORAL_TABLET | ORAL | 0 refills | Status: AC

## 2022-11-15 NOTE — Addendum Note (Signed)
Addended by: Barnie Mort on: 11/15/2022 10:38 AM   Modules accepted: Orders

## 2022-12-06 ENCOUNTER — Ambulatory Visit: Payer: Medicare HMO | Admitting: Physician Assistant

## 2022-12-06 NOTE — Progress Notes (Deleted)
I,Sha'taria Tahnee Cifuentes,acting as a Education administrator for [employer], PA-C.,have documented all relevant documentation on the behalf of [name], PA-C,as directed by  [name], PA-C while in the presence of [name], PA-C.   Established patient visit   Patient: Rebecca Patton   DOB: [birth year]   77 y.o. Female  MRN: [id number] Visit Date: 12/06/2022  Today's healthcare provider: [name], PA-C   No chief complaint on file.  Subjective    HPI  Hypertension, follow-up  BP Readings from Last 3 Encounters:  06/07/22 134/60  11/24/21 134/78  04/08/21 128/60   Wt Readings from Last 3 Encounters:  06/07/22 209 lb 6.4 oz (95 kg)  11/24/21 210 lb (95.3 kg)  04/08/21 212 lb (96.2 kg)     She was last seen for hypertension 6 months ago.  BP at that visit was 134/60. Management since that visit includes continue current medications.  Outside blood pressures are {***enter patient reported home BP readings, or 'not being checked':1}. Symptoms: {Yes/No:20286} chest pain {Yes/No:20286} chest pressure  {Yes/No:20286} palpitations {Yes/No:20286} syncope  {Yes/No:20286} dyspnea {Yes/No:20286} orthopnea  {Yes/No:20286} paroxysmal nocturnal dyspnea {Yes/No:20286} lower extremity edema   Pertinent labs Lab Results  Component Value Date   CHOL 208 (H) 06/13/2022   HDL 58 06/13/2022   LDLCALC 122 (H) 06/13/2022   TRIG 157 (H) 06/13/2022   CHOLHDL 3.6 06/13/2022   Lab Results  Component Value Date   NA 145 (H) 06/13/2022   K 3.8 06/13/2022   CREATININE 0.94 06/13/2022   EGFR 64 06/13/2022   GLUCOSE 112 (H) 06/13/2022   TSH 5.420 (H) 06/13/2022     The 10-year ASCVD risk score (Arnett DK, et al., 2019) is: 20.7%  ---------------------------------------------------------------------------------------------------   Medications: Outpatient Medications Prior to Visit  Medication Sig   aspirin 81 MG tablet Take 81 mg by mouth every 3 (three) days.   fluticasone  (FLONASE) 50 MCG/ACT nasal spray USE 1 SPRAY IN EACH NOSTRILDAILY   gabapentin (NEURONTIN) 100 MG capsule TAKE 1 CAPSULE TWICE DAILY (Patient not taking: Reported on 06/07/2022)   hydrochlorothiazide (HYDRODIURIL) 25 MG tablet TAKE 1 TABLET DAILY.   ibuprofen (ADVIL) 600 MG tablet Take 1 tablet (600 mg total) by mouth every 8 (eight) hours as needed for mild pain or moderate pain.   levothyroxine (SYNTHROID) 75 MCG tablet TAKE 1 TABLET DAILY BEFORE BREAKFAST   losartan (COZAAR) 100 MG tablet TAKE 1 TABLET(100 MG) BY MOUTH DAILY   metoprolol succinate (TOPROL-XL) 25 MG 24 hr tablet TAKE 1 TABLET DAILY. TAKE  WITH OR IMMEDIATELY        FOLLOWING A MEAL.   MULTIPLE VITAMIN PO Take 1 tablet by mouth daily. Centrum Silver   omeprazole (PRILOSEC) 20 MG capsule TAKE 1 CAPSULE TWICE DAILY BEFORE MEALS   sertraline (ZOLOFT) 50 MG tablet Take 1 tablet (50 mg total) by mouth daily.   triamcinolone cream (KENALOG) 0.1 % Apply 1 application. topically 2 (two) times daily. (Patient not taking: Reported on 06/07/2022)   vitamin B-12 (CYANOCOBALAMIN) 100 MCG tablet Take 200 mcg by mouth daily.   No facility-administered medications prior to visit.    Review of Systems  {Labs  Heme  Chem  Endocrine  Serology  Results Review (optional):23779}   Objective    There were no vitals taken for this visit. {Show previous vital signs (optional):23777}  Physical Exam  ***  No results found for any visits on 12/06/22.  Assessment & Plan     ***  No follow-ups  on file.      {provider attestation***:1}   [name], PA-C  [place] [phone] (phone) [phone] (fax)  [place]

## 2022-12-07 ENCOUNTER — Telehealth: Payer: Self-pay | Admitting: Family Medicine

## 2022-12-07 NOTE — Telephone Encounter (Signed)
Patient called in was told by home delivery, needs PA for hydrochlorothiazide (HYDRODIURIL) 25 MG tablet

## 2022-12-08 ENCOUNTER — Other Ambulatory Visit: Payer: Self-pay

## 2022-12-16 DIAGNOSIS — L821 Other seborrheic keratosis: Secondary | ICD-10-CM | POA: Diagnosis not present

## 2022-12-16 DIAGNOSIS — L2389 Allergic contact dermatitis due to other agents: Secondary | ICD-10-CM | POA: Diagnosis not present

## 2022-12-16 DIAGNOSIS — L298 Other pruritus: Secondary | ICD-10-CM | POA: Diagnosis not present

## 2022-12-16 DIAGNOSIS — L570 Actinic keratosis: Secondary | ICD-10-CM | POA: Diagnosis not present

## 2022-12-19 ENCOUNTER — Ambulatory Visit
Admission: RE | Admit: 2022-12-19 | Discharge: 2022-12-19 | Disposition: A | Discharge status: Home / Self Care | Payer: Medicare HMO | Source: Ambulatory Visit | Attending: Physician Assistant | Admitting: Physician Assistant

## 2022-12-19 DIAGNOSIS — Z1231 Encounter for screening mammogram for malignant neoplasm of breast: Secondary | ICD-10-CM | POA: Insufficient documentation

## 2022-12-22 DIAGNOSIS — R69 Illness, unspecified: Secondary | ICD-10-CM | POA: Diagnosis not present

## 2022-12-22 DIAGNOSIS — R739 Hyperglycemia, unspecified: Secondary | ICD-10-CM | POA: Diagnosis not present

## 2022-12-22 DIAGNOSIS — M79604 Pain in right leg: Secondary | ICD-10-CM | POA: Diagnosis not present

## 2022-12-22 DIAGNOSIS — E039 Hypothyroidism, unspecified: Secondary | ICD-10-CM | POA: Diagnosis not present

## 2022-12-22 DIAGNOSIS — I10 Essential (primary) hypertension: Secondary | ICD-10-CM | POA: Diagnosis not present

## 2022-12-22 DIAGNOSIS — Z8542 Personal history of malignant neoplasm of other parts of uterus: Secondary | ICD-10-CM | POA: Diagnosis not present

## 2022-12-23 ENCOUNTER — Other Ambulatory Visit: Payer: Self-pay | Admitting: Physician Assistant

## 2022-12-23 DIAGNOSIS — I10 Essential (primary) hypertension: Secondary | ICD-10-CM

## 2022-12-24 ENCOUNTER — Other Ambulatory Visit: Payer: Self-pay | Admitting: Physician Assistant

## 2022-12-24 DIAGNOSIS — I10 Essential (primary) hypertension: Secondary | ICD-10-CM

## 2022-12-25 ENCOUNTER — Other Ambulatory Visit: Payer: Self-pay | Admitting: Physician Assistant

## 2022-12-25 DIAGNOSIS — I10 Essential (primary) hypertension: Secondary | ICD-10-CM

## 2022-12-29 DIAGNOSIS — M13861 Other specified arthritis, right knee: Secondary | ICD-10-CM | POA: Diagnosis not present

## 2022-12-29 DIAGNOSIS — M25561 Pain in right knee: Secondary | ICD-10-CM | POA: Diagnosis not present

## 2022-12-31 ENCOUNTER — Other Ambulatory Visit: Payer: Self-pay | Admitting: Physician Assistant

## 2022-12-31 DIAGNOSIS — I10 Essential (primary) hypertension: Secondary | ICD-10-CM

## 2023-01-02 DIAGNOSIS — M25561 Pain in right knee: Secondary | ICD-10-CM | POA: Diagnosis not present

## 2023-01-02 DIAGNOSIS — M2391 Unspecified internal derangement of right knee: Secondary | ICD-10-CM | POA: Diagnosis not present

## 2023-01-02 DIAGNOSIS — G8929 Other chronic pain: Secondary | ICD-10-CM | POA: Diagnosis not present

## 2023-01-02 DIAGNOSIS — M1611 Unilateral primary osteoarthritis, right hip: Secondary | ICD-10-CM | POA: Diagnosis not present

## 2023-01-02 DIAGNOSIS — Z6837 Body mass index (BMI) 37.0-37.9, adult: Secondary | ICD-10-CM | POA: Diagnosis not present

## 2023-01-03 ENCOUNTER — Other Ambulatory Visit: Payer: Self-pay | Admitting: Orthopedic Surgery

## 2023-01-03 DIAGNOSIS — M2391 Unspecified internal derangement of right knee: Secondary | ICD-10-CM

## 2023-01-03 DIAGNOSIS — G8929 Other chronic pain: Secondary | ICD-10-CM

## 2023-01-06 ENCOUNTER — Encounter: Payer: Self-pay | Admitting: Orthopedic Surgery

## 2023-01-17 ENCOUNTER — Ambulatory Visit
Admission: RE | Admit: 2023-01-17 | Discharge: 2023-01-17 | Disposition: A | Discharge status: Home / Self Care | Payer: Medicare HMO | Source: Ambulatory Visit | Attending: Orthopedic Surgery | Admitting: Orthopedic Surgery

## 2023-01-17 DIAGNOSIS — M2391 Unspecified internal derangement of right knee: Secondary | ICD-10-CM

## 2023-01-17 DIAGNOSIS — G8929 Other chronic pain: Secondary | ICD-10-CM

## 2023-03-13 DIAGNOSIS — M2391 Unspecified internal derangement of right knee: Secondary | ICD-10-CM | POA: Diagnosis not present

## 2023-03-18 ENCOUNTER — Other Ambulatory Visit: Payer: Self-pay | Admitting: Physician Assistant

## 2023-03-18 DIAGNOSIS — I10 Essential (primary) hypertension: Secondary | ICD-10-CM

## 2023-04-12 ENCOUNTER — Telehealth: Payer: Self-pay

## 2023-04-12 NOTE — Telephone Encounter (Signed)
LVM for patient to call back [phone], or to call PCP office to schedule follow up apt. AS, CMA  

## 2023-07-31 DIAGNOSIS — H40013 Open angle with borderline findings, low risk, bilateral: Secondary | ICD-10-CM | POA: Diagnosis not present

## 2023-07-31 DIAGNOSIS — L235 Allergic contact dermatitis due to other chemical products: Secondary | ICD-10-CM | POA: Diagnosis not present

## 2023-07-31 DIAGNOSIS — H2513 Age-related nuclear cataract, bilateral: Secondary | ICD-10-CM | POA: Diagnosis not present

## 2023-09-15 DIAGNOSIS — I10 Essential (primary) hypertension: Secondary | ICD-10-CM | POA: Diagnosis not present

## 2023-09-15 DIAGNOSIS — F32A Depression, unspecified: Secondary | ICD-10-CM | POA: Diagnosis not present

## 2023-09-15 DIAGNOSIS — R918 Other nonspecific abnormal finding of lung field: Secondary | ICD-10-CM | POA: Diagnosis not present

## 2023-09-15 DIAGNOSIS — E039 Hypothyroidism, unspecified: Secondary | ICD-10-CM | POA: Diagnosis not present

## 2023-09-15 DIAGNOSIS — R0789 Other chest pain: Secondary | ICD-10-CM | POA: Diagnosis not present

## 2023-09-16 ENCOUNTER — Other Ambulatory Visit: Payer: Self-pay | Admitting: Physician Assistant

## 2023-09-16 DIAGNOSIS — E039 Hypothyroidism, unspecified: Secondary | ICD-10-CM

## 2023-10-13 DIAGNOSIS — F3342 Major depressive disorder, recurrent, in full remission: Secondary | ICD-10-CM | POA: Diagnosis not present

## 2023-10-13 DIAGNOSIS — J189 Pneumonia, unspecified organism: Secondary | ICD-10-CM

## 2023-10-13 DIAGNOSIS — R051 Acute cough: Secondary | ICD-10-CM | POA: Diagnosis not present

## 2023-10-13 DIAGNOSIS — J188 Other pneumonia, unspecified organism: Secondary | ICD-10-CM | POA: Diagnosis not present

## 2023-10-13 DIAGNOSIS — J069 Acute upper respiratory infection, unspecified: Secondary | ICD-10-CM | POA: Diagnosis not present

## 2023-10-13 HISTORY — DX: Pneumonia, unspecified organism: J18.9

## 2024-01-03 ENCOUNTER — Ambulatory Visit
Admission: RE | Admit: 2024-01-03 | Discharge: 2024-01-03 | Disposition: A | Discharge status: Home / Self Care | Source: Ambulatory Visit | Attending: Family Medicine | Admitting: Family Medicine

## 2024-01-03 ENCOUNTER — Other Ambulatory Visit: Payer: Self-pay | Admitting: Family Medicine

## 2024-01-03 DIAGNOSIS — Z1231 Encounter for screening mammogram for malignant neoplasm of breast: Secondary | ICD-10-CM | POA: Insufficient documentation

## 2024-01-08 ENCOUNTER — Encounter: Payer: Self-pay | Admitting: Family Medicine

## 2024-01-12 ENCOUNTER — Other Ambulatory Visit: Payer: Self-pay | Admitting: Family Medicine

## 2024-01-12 DIAGNOSIS — R928 Other abnormal and inconclusive findings on diagnostic imaging of breast: Secondary | ICD-10-CM

## 2024-01-16 ENCOUNTER — Ambulatory Visit
Admission: RE | Admit: 2024-01-16 | Discharge: 2024-01-16 | Disposition: A | Discharge status: Home / Self Care | Source: Ambulatory Visit | Attending: Family Medicine | Admitting: Family Medicine

## 2024-01-16 DIAGNOSIS — R928 Other abnormal and inconclusive findings on diagnostic imaging of breast: Secondary | ICD-10-CM | POA: Insufficient documentation

## 2024-01-16 DIAGNOSIS — N6312 Unspecified lump in the right breast, upper inner quadrant: Secondary | ICD-10-CM | POA: Diagnosis not present

## 2024-01-16 DIAGNOSIS — N6311 Unspecified lump in the right breast, upper outer quadrant: Secondary | ICD-10-CM | POA: Diagnosis not present

## 2024-01-16 DIAGNOSIS — R921 Mammographic calcification found on diagnostic imaging of breast: Secondary | ICD-10-CM | POA: Diagnosis not present

## 2024-01-18 ENCOUNTER — Other Ambulatory Visit: Payer: Self-pay | Admitting: Family Medicine

## 2024-01-18 DIAGNOSIS — R928 Other abnormal and inconclusive findings on diagnostic imaging of breast: Secondary | ICD-10-CM

## 2024-01-23 ENCOUNTER — Other Ambulatory Visit: Payer: Self-pay | Admitting: Family Medicine

## 2024-01-23 DIAGNOSIS — R928 Other abnormal and inconclusive findings on diagnostic imaging of breast: Secondary | ICD-10-CM

## 2024-01-24 ENCOUNTER — Ambulatory Visit
Admission: RE | Admit: 2024-01-24 | Discharge: 2024-01-24 | Disposition: A | Discharge status: Home / Self Care | Source: Ambulatory Visit | Attending: Family Medicine | Admitting: Family Medicine

## 2024-01-24 DIAGNOSIS — C50411 Malignant neoplasm of upper-outer quadrant of right female breast: Secondary | ICD-10-CM | POA: Diagnosis not present

## 2024-01-24 DIAGNOSIS — R928 Other abnormal and inconclusive findings on diagnostic imaging of breast: Secondary | ICD-10-CM

## 2024-01-24 DIAGNOSIS — Z170 Estrogen receptor positive status [ER+]: Secondary | ICD-10-CM | POA: Diagnosis not present

## 2024-01-24 HISTORY — PX: BREAST BIOPSY: SHX20

## 2024-01-24 MED ORDER — LIDOCAINE-EPINEPHRINE 1 %-1:100000 IJ SOLN
5.0000 mL | Freq: Once | INTRAMUSCULAR | Status: AC
  Administered 2024-01-24: 5 mL
  Filled 2024-01-24: qty 5

## 2024-01-24 MED ORDER — LIDOCAINE 1 % OPTIME INJ - NO CHARGE
2.0000 mL | Freq: Once | INTRAMUSCULAR | Status: AC
  Administered 2024-01-24: 2 mL

## 2024-01-24 MED ORDER — LIDOCAINE 1 % OPTIME INJ - NO CHARGE
2.0000 mL | Freq: Once | INTRAMUSCULAR | Status: AC
  Administered 2024-01-24: 2 mL
  Filled 2024-01-24: qty 2

## 2024-01-24 MED ORDER — LIDOCAINE-EPINEPHRINE 1 %-1:100000 IJ SOLN
5.0000 mL | Freq: Once | INTRAMUSCULAR | Status: AC
  Administered 2024-01-24: 5 mL

## 2024-01-25 LAB — SURGICAL PATHOLOGY

## 2024-01-26 ENCOUNTER — Encounter: Payer: Self-pay | Admitting: *Deleted

## 2024-01-26 DIAGNOSIS — C50919 Malignant neoplasm of unspecified site of unspecified female breast: Secondary | ICD-10-CM

## 2024-01-26 NOTE — Progress Notes (Signed)
 Received referral for newly diagnosed breast cancer from [facility].  Navigation initiated.  Discussed and sent a list of local surgeons and medical oncologists.  She will call me back once she discusses with her daughters.

## 2024-01-26 NOTE — Progress Notes (Signed)
 Ms. Cass has decided she would like to see Dr. Randy Buttery.   She wants to wait and discuss surgeons with Dr. Randy Buttery when she come in on Tuesday.

## 2024-01-30 ENCOUNTER — Inpatient Hospital Stay

## 2024-01-30 ENCOUNTER — Encounter: Payer: Self-pay | Admitting: *Deleted

## 2024-01-30 ENCOUNTER — Other Ambulatory Visit: Payer: Self-pay | Admitting: Licensed Clinical Social Worker

## 2024-01-30 ENCOUNTER — Encounter: Payer: Self-pay | Admitting: Oncology

## 2024-01-30 ENCOUNTER — Inpatient Hospital Stay: Attending: Oncology | Admitting: Oncology

## 2024-01-30 VITALS — BP 154/76 | HR 61 | Resp 19 | Wt 206.8 lb

## 2024-01-30 DIAGNOSIS — C50919 Malignant neoplasm of unspecified site of unspecified female breast: Secondary | ICD-10-CM

## 2024-01-30 DIAGNOSIS — Z170 Estrogen receptor positive status [ER+]: Secondary | ICD-10-CM | POA: Diagnosis not present

## 2024-01-30 DIAGNOSIS — C50411 Malignant neoplasm of upper-outer quadrant of right female breast: Secondary | ICD-10-CM | POA: Insufficient documentation

## 2024-01-30 DIAGNOSIS — Z800 Family history of malignant neoplasm of digestive organs: Secondary | ICD-10-CM | POA: Diagnosis not present

## 2024-01-30 DIAGNOSIS — Z7189 Other specified counseling: Secondary | ICD-10-CM

## 2024-01-30 LAB — GENETIC SCREENING ORDER

## 2024-01-30 NOTE — Progress Notes (Signed)
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 quadrant Using sterile technique and 1% lidocaine  and 1% lidocaine  with epinephrine  as local anesthetic, under direct ultrasound visualization, a 14 gauge spring-loaded device was used to perform biopsy of a mass at 10 o'clock 7 cm from the nipple using a inferior approach. At the conclusion of the procedure a COIL shaped tissue marker clip was deployed into the biopsy cavity. Follow up 2 view mammogram was performed and dictated separately. Site 3: 11 o'clock 5 cm from the nipple Lesion quadrant: Upper outer quadrant Using sterile technique and 1% lidocaine  and 1% lidocaine  with epinephrine  as local anesthetic, under direct ultrasound visualization, a 14 gauge spring-loaded device was used to perform biopsy of a mass at 11  o'clock 5 cm from the nipple using a inferior approach. At the conclusion of the procedure a VENUS shaped tissue marker clip was deployed into the biopsy cavity. Follow up 2 view  mammogram was performed and dictated separately. IMPRESSION: 1. Ultrasound guided biopsy of 3 RIGHT breast masses as described above. No apparent complications. 2. If there are malignant results, recommend dedicated breast MRI with and without contrast for further characterization. Electronically Signed: By: Clancy Crimes M.D. On: 01/24/2024 11:13   MM CLIP PLACEMENT RIGHT Result Date: 01/24/2024 CLINICAL DATA:  Status post 3 site ultrasound-guided biopsy EXAM: 3D DIAGNOSTIC RIGHT MAMMOGRAM POST ULTRASOUND BIOPSY COMPARISON:  Previous exam(s). ACR Breast Density Category c: The breasts are heterogeneously dense, which may obscure small masses. FINDINGS: 3D Mammographic images were obtained following ultrasound guided biopsy of a mass at 10 o'clock 8 cm from the nipple. The RIBBON biopsy marking clip is in expected position at the site of biopsy. 3D Mammographic images were obtained following ultrasound guided biopsy of a mass at 10 o'clock 7 cm from the nipple. The COIL biopsy marking clip is in expected position at the site of biopsy. 3D Mammographic images were obtained following ultrasound guided biopsy of the mass at 11 o'clock 5 cm from the nipple. The venus biopsy marking clip is in expected position at the site of biopsy. The 3 clips fall at the 3 main sites of architectural distortion noted mammographically. They span approximately 3.4 cm transverse extent by 1.5 cm in cranial caudal extent by 2.9 cm in anterior-posterior extent IMPRESSION: 1. Appropriate positioning of the RIBBON shaped biopsy marking clip at the site of biopsy in the upper outer breast at 10 o'clock 8 cm from the nipple. 2. Appropriate positioning of the COIL shaped biopsy marking clip at the site of biopsy in the upper outer breast at 10 o'clock 7 cm from the nipple. 3. Appropriate positioning of the venus shaped biopsy marking clip at the site of biopsy in the 11 o'clock 5 cm from the nipple. Final Assessment: Post Procedure  Mammograms for Marker Placement Electronically Signed   By: Clancy Crimes M.D.   On: 01/24/2024 10:48   MM 3D DIAGNOSTIC MAMMOGRAM UNILATERAL RIGHT BREAST Result Date: 01/16/2024 CLINICAL DATA:  Screening recall for possible RIGHT breast distortion. No history of surgery or trauma. History of right breast cysts. EXAM: DIGITAL DIAGNOSTIC UNILATERAL RIGHT MAMMOGRAM WITH TOMOSYNTHESIS AND CAD; ULTRASOUND RIGHT BREAST LIMITED TECHNIQUE: Right digital diagnostic mammography and breast tomosynthesis was performed. The images were evaluated with computer-aided detection. ; Targeted ultrasound examination of the right breast was performed COMPARISON:  Previous exam(s). ACR Breast Density Category c: The breasts are heterogeneously dense, which may obscure small masses. FINDINGS: Mammogram: Diagnostic views of the right breast demonstrate at least 3 areas of adjacent architectural distortion in the upper-outer and upper central right breast middle to posterior depth, best seen on the CC view (spot CC 38-47, ML image 57). This corresponds with the area of screening mammogram concern. There are additional scattered and fluctuating oval circumscribed masses in the right breast, most consistent with fluctuating benign cysts. An oval circumscribed mass in the upper slightly inner right breast demonstrates benign rim calcifications (ML image 32/83). No additional suspicious findings are identified in the right breast. Ultrasound: Targeted right breast ultrasound was performed: At 10 o'clock 8 cm from the nipple, there is an irregular hypoechoic mass that measures approximately 2.3 x 1.1 x 2.1 cm. At 10 o'clock 7 cm from nipple, there is an additional irregular  hypoechoic mass that measures 0.7 x 0.5 x 0.6 cm. This is located immediately adjacent to the mass seen at 10 o'clock 8 cm from nipple, with the overall area spanning approximately 3.3 cm. These masses are favored to correspond with the 2 adjacent areas of  architectural distortion in the upper-outer right breast. At 11 o'clock 5 cm from nipple, there is an additional irregular hypoechoic mass. It measures 0.9 x 0.5 x 0.4 cm. This is favored to correspond with the additional area of architectural distortion seen in the upper central right breast. At 1 o'clock 4 cm from nipple, there is an oval circumscribed predominantly anechoic mass with a associated echogenic rim. It measures 1.4 x 1.3 x 1.3 cm. There is no internal vascularity. This corresponds with the oval circumscribed mass with associated benign rim calcifications seen on mammogram. There is an additional benign simple cyst immediately adjacent to this mass measuring up to 1.5 cm. Targeted right axillary ultrasound demonstrates multiple morphologically benign lymph nodes. No lymphadenopathy. IMPRESSION: 1. Multifocal architectural distortion with corresponding irregular hypoechoic masses in the RIGHT breast 10 to 11 o'clock position. Recommend further assessment with a 2 site ultrasound-guided biopsy of the masses in the 10 o'clock 8 cm from the nipple and 11 o'clock 5 cm from the nipple positions. 2. No RIGHT axillary lymphadenopathy. RECOMMENDATION: RIGHT breast 2-site ultrasound-guided biopsy. Recommend close attention to post-procedure mammogram to ensure mammographic/sonographic correlation. If there is an additional area of mammographic architectural distortion that is distant from the sites of the biopsy marking clips, recommend additional stereotactic guided biopsy of this area. I have discussed the findings and recommendations with the patient. The biopsy procedure was discussed with the patient and questions were answered. Patient expressed their understanding of the biopsy recommendation. Patient will be scheduled for biopsy at her earliest convenience by the schedulers. Ordering provider will be notified. If applicable, a reminder letter will be sent to the patient regarding the next appointment.  BI-RADS CATEGORY  4: Suspicious. Electronically Signed   By: Sande Cromer M.D.   On: 01/16/2024 16:25   US  LIMITED ULTRASOUND INCLUDING AXILLA RIGHT BREAST Result Date: 01/16/2024 CLINICAL DATA:  Screening recall for possible RIGHT breast distortion. No history of surgery or trauma. History of right breast cysts. EXAM: DIGITAL DIAGNOSTIC UNILATERAL RIGHT MAMMOGRAM WITH TOMOSYNTHESIS AND CAD; ULTRASOUND RIGHT BREAST LIMITED TECHNIQUE: Right digital diagnostic mammography and breast tomosynthesis was performed. The images were evaluated with computer-aided detection. ; Targeted ultrasound examination of the right breast was performed COMPARISON:  Previous exam(s). ACR Breast Density Category c: The breasts are heterogeneously dense, which may obscure small masses. FINDINGS: Mammogram: Diagnostic views of the right breast demonstrate at least 3 areas of adjacent architectural distortion in the upper-outer and upper central right breast middle to posterior depth, best seen on the CC view (spot CC 38-47, ML image 57). This corresponds with the area of screening mammogram concern. There are additional scattered and fluctuating oval circumscribed masses in the right breast, most consistent with fluctuating benign cysts. An oval circumscribed mass in the upper slightly inner right breast demonstrates benign rim calcifications (ML image 32/83). No additional suspicious findings are identified in the right breast. Ultrasound: Targeted right breast ultrasound was performed: At 10 o'clock 8 cm from the nipple, there is an irregular hypoechoic mass that measures approximately 2.3 x 1.1 x 2.1 cm. At 10 o'clock 7 cm from nipple, there is an additional irregular hypoechoic mass that measures 0.7 x 0.5 x 0.6 cm. This is located immediately  adjacent to the mass seen at 10 o'clock 8 cm from nipple, with the overall area spanning approximately 3.3 cm. These masses are favored to correspond with the 2 adjacent areas of  architectural distortion in the upper-outer right breast. At 11 o'clock 5 cm from nipple, there is an additional irregular hypoechoic mass. It measures 0.9 x 0.5 x 0.4 cm. This is favored to correspond with the additional area of architectural distortion seen in the upper central right breast. At 1 o'clock 4 cm from nipple, there is an oval circumscribed predominantly anechoic mass with a associated echogenic rim. It measures 1.4 x 1.3 x 1.3 cm. There is no internal vascularity. This corresponds with the oval circumscribed mass with associated benign rim calcifications seen on mammogram. There is an additional benign simple cyst immediately adjacent to this mass measuring up to 1.5 cm. Targeted right axillary ultrasound demonstrates multiple morphologically benign lymph nodes. No lymphadenopathy. IMPRESSION: 1. Multifocal architectural distortion with corresponding irregular hypoechoic masses in the RIGHT breast 10 to 11 o'clock position. Recommend further assessment with a 2 site ultrasound-guided biopsy of the masses in the 10 o'clock 8 cm from the nipple and 11 o'clock 5 cm from the nipple positions. 2. No RIGHT axillary lymphadenopathy. RECOMMENDATION: RIGHT breast 2-site ultrasound-guided biopsy. Recommend close attention to post-procedure mammogram to ensure mammographic/sonographic correlation. If there is an additional area of mammographic architectural distortion that is distant from the sites of the biopsy marking clips, recommend additional stereotactic guided biopsy of this area. I have discussed the findings and recommendations with the patient. The biopsy procedure was discussed with the patient and questions were answered. Patient expressed their understanding of the biopsy recommendation. Patient will be scheduled for biopsy at her earliest convenience by the schedulers. Ordering provider will be notified. If applicable, a reminder letter will be sent to the patient regarding the next appointment.  BI-RADS CATEGORY  4: Suspicious. Electronically Signed   By: Sande Cromer M.D.   On: 01/16/2024 16:25   MM 3D SCREENING MAMMOGRAM BILATERAL BREAST Result Date: 01/05/2024 CLINICAL DATA:  Screening. * EXAM: DIGITAL SCREENING BILATERAL MAMMOGRAM WITH TOMOSYNTHESIS AND CAD TECHNIQUE: Bilateral screening digital craniocaudal and mediolateral oblique mammograms were obtained. Bilateral screening digital breast tomosynthesis was performed. The images were evaluated with computer-aided detection. COMPARISON:  Previous exam(s). ACR Breast Density Category c: The breasts are heterogeneously dense, which may obscure small masses. FINDINGS: In the right breast, possible distortion warrants further evaluation. In the left breast, no findings suspicious for malignancy. IMPRESSION: Further evaluation is suggested for possible distortion in the right breast. RECOMMENDATION: Diagnostic mammogram and possibly ultrasound of the right breast. (Code:[id number]-R-39M) The patient will be contacted regarding the findings, and additional imaging will be scheduled. BI-RADS CATEGORY  0: Incomplete: Need additional imaging evaluation. Electronically Signed   By: Alger Infield M.D.   On: 01/05/2024 13:26    Assessment and plan- Patient is a 78 y.o. 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  Patient found to have 3 foci of invasive mammary carcinoma of the right breast at the 10:00 and 11 o'clock position fromThe nipple measuring 2.3 cm, 0.7 cm and 0.9 cm respectively.  The 2 foci which were at the 10 o'clock position 8 cm and 7 cm respectively spanning an area of 3.3 cm.  Her tumor is strongly  ER/PR positive and HER2 negative and there is no evidence of axillary lymph node involvement on mammogram and ultrasound.  I therefore  recommend upfront surgery for her.  I am referring her to Dr. Charmel Cooter who will discuss lumpectomy versus mastectomy and sentinel lymph node biopsy with her.  If patient plans on getting the lumpectomy she would benefit from adjuvant radiation therapy.  Postmastectomy radiation may may not be required.  From a medical oncology standpoint I will plan to send Oncotype testing after her surgery is done to determine if she would benefit from adjuvant chemotherapy or not.  Discussed what Oncotype testing is and how the results are interpreted.  If the results show that she falls in the high risk group with a score of 26 or higher she would benefit from adjuvant chemotherapy.  If her score is 25 or lower she would not benefit from adjuvant chemotherapy.  Given that her tumor is strongly ER/PR positive she would benefit from adjuvant endocrine therapy which I will discuss with her in greater detail down the line.  I will tentatively see her back 2 to 3 weeks after her surgery.  No role for tumor marker testing or surveillance imaging at this time for stage I breast cancer.  We are sending blood work today for genetic testing   Cancer Staging  Malignant neoplasm of upper-outer quadrant of right breast in female, estrogen receptor positive (HCC) Staging form: Breast, AJCC 8th Edition - Clinical stage from 02/01/2024: Stage IB (cT2, cN0, cM0, G2, ER+, PR+, HER2-) - Signed by Avonne Boettcher, MD on 02/01/2024 Method of lymph node assessment: Clinical Nuclear grade: G2 Histologic grading system: 3 grade system     Thank you for this kind referral and the opportunity to participate in the care of this  Patient   Visit Diagnosis No diagnosis found.  Dr. Seretha Dance, MD, MPH [facility] [phone] 01/30/2024

## 2024-01-30 NOTE — Progress Notes (Signed)
 Accompanied patient and family to initial medical oncology appointment.   Reviewed Breast Cancer treatment handbook.   Care plan summary given to patient.   Reviewed outreach programs and cancer center services. Referral sent to Dr. Charmel Cooter, she will see him on Friday May 23rd at 9:30.   Baseline SOZO obtained.

## 2024-01-31 ENCOUNTER — Inpatient Hospital Stay (HOSPITAL_BASED_OUTPATIENT_CLINIC_OR_DEPARTMENT_OTHER): Admitting: Licensed Clinical Social Worker

## 2024-01-31 DIAGNOSIS — Z8542 Personal history of malignant neoplasm of other parts of uterus: Secondary | ICD-10-CM | POA: Diagnosis not present

## 2024-01-31 DIAGNOSIS — C50919 Malignant neoplasm of unspecified site of unspecified female breast: Secondary | ICD-10-CM

## 2024-01-31 DIAGNOSIS — Z800 Family history of malignant neoplasm of digestive organs: Secondary | ICD-10-CM | POA: Diagnosis not present

## 2024-01-31 NOTE — Progress Notes (Signed)
 REFERRING PROVIDER: Avonne Boettcher, MD [address] [place],  [place] [address]  PRIMARY PROVIDER:  Nikki Barters, MD  PRIMARY REASON FOR VISIT:  1. Invasive carcinoma of breast (HCC)   2. History of uterine cancer   3. Family history of pancreatic cancer    I connected with Ms. Pricer on 01/31/2024 at 1:00 PM EDT by MyChart video conference and verified that I am speaking with the correct person using two identifiers.    Patient location: home Provider location: [facility]  HISTORY OF PRESENT ILLNESS:   Ms. Christian, a 78 y.o. female, was seen for a [place] cancer genetics consultation at the request of Dr. Randy Buttery due to a personal and family history of cancer.  Ms. Weier presents to clinic today to discuss the possibility of a hereditary predisposition to cancer, genetic testing, and to further clarify her future cancer risks, as well as potential cancer risks for family members.    CANCER HISTORY:  In 2025, at the age of 10, Ms. Mcglade was diagnosed with invasive mammary carcinoma of the right breast, ER/PR+ HER2-.  In 2009, at the age of 58, Ms. Colborn was diagnosed with uterine cancer, this was treated with TAH-BSO and radiation.  RISK FACTORS:  Ovaries intact: no.  Hysterectomy: yes.  Menopausal status: postmenopausal.  Colonoscopy: yes; normal. Few polyps.  Past Medical History:  Diagnosis Date   Arthritis    COVID-19 09/2020   Depression    Dysrhythmia    ST   GERD (gastroesophageal reflux disease)    High blood pressure    Hypothyroidism    MRSA infection 2006   on nose   Uterine cancer (HCC) 2009    Past Surgical History:  Procedure Laterality Date   ABDOMINAL HYSTERECTOMY  09/13/2007   BSO   BREAST BIOPSY Right 01/24/2024   US  RT BREAST BX W LOC DEV EA ADD LESION IMG BX SPEC US  GUIDE 01/24/2024 [facility]   BREAST BIOPSY Right 01/24/2024   US  RT BREAST BX W LOC DEV 1ST LESION IMG BX SPEC US  GUIDE 01/24/2024 [facility]   BREAST  BIOPSY Right 01/24/2024   US  RT BREAST BX W LOC DEV EA ADD LESION IMG BX SPEC US  GUIDE 01/24/2024 [facility]   BUNIONECTOMY     CHOLECYSTECTOMY     COLONOSCOPY WITH PROPOFOL  N/A 01/23/2020   Procedure: COLONOSCOPY WITH PROPOFOL ;  Surgeon: Toledo, Alphonsus Jeans, MD;  Location: [facility];  Service: Gastroenterology;  Laterality: N/A;   EXCISION NEUROMA     INSERTION OF MESH N/A 05/18/2018   Procedure: INSERTION OF MESH;  Surgeon: Franki Isles, MD;  Location: [facility] ORS;  Service: General;  Laterality: N/A;   NASAL SINUS SURGERY     TUBAL LIGATION     UMBILICAL HERNIA REPAIR N/A 05/18/2018   Procedure: HERNIA REPAIR SUPRA-UMBILICAL ADULT;  Surgeon: Franki Isles, MD;  Location: [facility] ORS;  Service: General;  Laterality: N/A;    FAMILY HISTORY:  We obtained a detailed, 4-generation family history.  Significant diagnoses are listed below: Family History  Problem Relation Age of Onset   Parkinson's disease Mother    Diabetes Father    CAD Father    Cancer Sister        squamous cell cancer in her eye   Cancer Brother    Pancreatic cancer Brother    Tremor Brother    Breast cancer Neg Hx    Ms. Thole has 2 daughters, ages 91 and 18, no history of cancer.  She had 3 brothers and 1 sister. One brother had pancreatic cancer in his 52s and passed of it. Her sister had eye cancer around age 46 and is living at 49.   Ms. Vitrano is unaware of any other cancer in the family.  Ms. Scalia is unaware of previous family history of genetic testing for hereditary cancer risks. There is no reported Ashkenazi Jewish ancestry. There is no known consanguinity.    GENETIC COUNSELING ASSESSMENT: Ms. Landstrom is a 78 y.o. female with a personal history of breast and uterine cancer and a family history of pancreatic cancer which is somewhat suggestive of a hereditary cancer syndrome and predisposition to cancer. We, therefore, discussed and recommended the following at today's visit.    DISCUSSION: We discussed that approximately 10% of breast cancer is hereditary. Most cases of hereditary breast cancer are associated with BRCA1/2 genes, which can also increase risk for pancreatic cancer, although there are other genes associated with hereditary cancer as well. Cancers and risks are gene specific. We discussed that testing is beneficial for several reasons including knowing about cancer risks, identifying potential screening and risk-reduction options that may be appropriate, and to understand if other family members could be at risk for cancer and allow them to undergo genetic testing.   We reviewed the characteristics, features and inheritance patterns of hereditary cancer syndromes. We also discussed genetic testing, including the appropriate family members to test, the process of testing, insurance coverage and turn-around-time for results. We discussed the implications of a negative, positive and/or variant of uncertain significant result. We recommended Ms. Bogusz pursue genetic testing for the [facility] CancerNext+RNA gene panel.   Based on Ms. Mceachin's personal and family history of cancer, she meets medical criteria for genetic testing. Despite that she meets criteria, she may still have an out of pocket cost.   PLAN: After considering the risks, benefits, and limitations, Ms. Belizaire provided informed consent to pursue genetic testing and the blood sample was sent to [facility] for analysis of the CancerNext+RNA panel. Results should be available within approximately 2-3 weeks' time, at which point they will be disclosed by telephone to Ms. Mcfarland, as will any additional recommendations warranted by these results. Ms. Gibb will receive a summary of her genetic counseling visit and a copy of her results once available. This information will also be available in Epic.   Ms. Finazzo questions were answered to her satisfaction today. Our contact information was provided  should additional questions or concerns arise. Thank you for the referral and allowing us  to share in the care of your patient.   [name], MS, [facility] Genetic Counselor [email or web address] Phone: [phone]  35 minutes were spent on the date of the encounter in service to the patient including preparation, face-to-face consultation, documentation and care coordination. Dr. Nelson Bandy was available for discussion regarding this case.   _______________________________________________________________________ For Office Staff:  Number of people involved in session: 1 Was an Intern/ student involved with case: no

## 2024-02-01 DIAGNOSIS — Z170 Estrogen receptor positive status [ER+]: Secondary | ICD-10-CM | POA: Insufficient documentation

## 2024-02-02 DIAGNOSIS — C50411 Malignant neoplasm of upper-outer quadrant of right female breast: Secondary | ICD-10-CM | POA: Diagnosis not present

## 2024-02-02 DIAGNOSIS — Z170 Estrogen receptor positive status [ER+]: Secondary | ICD-10-CM | POA: Diagnosis not present

## 2024-02-06 ENCOUNTER — Encounter: Payer: Self-pay | Admitting: *Deleted

## 2024-02-06 ENCOUNTER — Other Ambulatory Visit: Payer: Self-pay | Admitting: General Surgery

## 2024-02-06 ENCOUNTER — Encounter: Payer: Self-pay | Admitting: General Surgery

## 2024-02-06 DIAGNOSIS — Z170 Estrogen receptor positive status [ER+]: Secondary | ICD-10-CM

## 2024-02-06 DIAGNOSIS — C50919 Malignant neoplasm of unspecified site of unspecified female breast: Secondary | ICD-10-CM

## 2024-02-06 DIAGNOSIS — Z9189 Other specified personal risk factors, not elsewhere classified: Secondary | ICD-10-CM

## 2024-02-06 NOTE — Progress Notes (Signed)
 Lumpectomy is scheduled for 6/9.  She will see Dr. Randy Buttery and Dr. Jacalyn Martin back on July 1st.  Appt. Details given to her.

## 2024-02-08 ENCOUNTER — Ambulatory Visit: Payer: Self-pay | Admitting: General Surgery

## 2024-02-08 NOTE — H&P (Signed)
 History of Present Illness Rebecca Patton is a 78 year old female with a history of uterine cancer who presents for evaluation of breast cancer. She was referred by Dr. Randy Buttery for evaluation of breast cancer.  She underwent a routine screening mammogram which revealed concerning areas in her breast. Subsequent imaging with diagnostic mammogram and ultrasound confirmed these findings, leading to three biopsies, all of which returned positive for invasive mammary cancer ER positive, PR positive, Her2 negative. No prior symptoms such as discharge, masses, skin changes, or breast pain were noted before the mammogram. I personally evaluated the images.   She has a history of uterine cancer for which she received radiation treatments to her pelvic area. She denies any hormone therapy post-menopause, which occurred around 2003. She has had two pregnancies and used birth control pills when she got married.  Family history includes her sister having squamous cell carcinoma in her eye, resulting in loss of the eye, and her brother who passed away from pancreatic cancer. She reports no family history of breast cancer.   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  Anxiety 03/20/2015  Arthritis 03/20/2015  Benign neoplasm of colon 03/20/2015  Clinical depression 03/20/2015  Essential (primary) hypertension 03/20/2015  HLD (hyperlipidemia) 03/20/2015  Malignant neoplasm of corpus uteri, except isthmus (CMS/HHS-HCC) 03/20/2015  Osteopenia 03/20/2015  Supraumbilical hernia 05/15/2018     PAST SURGICAL HISTORY:  Past Surgical History:  Procedure Laterality Date  COLONOSCOPY 01/23/2020  Hyperplastic colon polyp/No Repeat due to age/TKT  bunionectomy  HERNIA REPAIR  HYSTERECTOMY  TUBAL LIGATION    MEDICATIONS:  Outpatient Encounter Medications as of 02/02/2024  Medication Sig Dispense Refill  aspirin 81 MG EC tablet Take 81 mg by mouth once daily  aspirin-calcium  carbonate 81 mg-300 mg calcium(777 mg) Tab Take by mouth  betamethasone dipropionate, augmented, (DIPROLENE-AF) 0.05 % cream  celecoxib (CELEBREX) 100 MG capsule TAKE 1 CAPSULE TWICE DAILY 180 capsule 1  coQ10, ubiquinol, (QUNOL MEGA COQ10) 100 mg Cap Take by mouth  cyanocobalamin (VITAMIN B12) 100 MCG tablet Take by mouth  fluticasone  propionate (FLONASE ) 50 mcg/actuation nasal spray USE 2 SPRAYS IN EACH NOSTRIL TWICE DAILY AS NEEDED FOR RHINITIS FOR UP TO 90 DAYS  garlic 1,000 mg Cap Take by mouth  hydroCHLOROthiazide  (HYDRODIURIL ) 25 MG tablet Take 1 tablet (25 mg total) by mouth once daily 10 tablet 0  ibuprofen  (MOTRIN ) 600 MG tablet Take by mouth  Lactobac no.41/Bifidobact no.7 (PROBIOTIC-10 ORAL) Take 1 capsule by mouth once daily  levocetirizine (XYZAL ) 5 MG tablet Take 5 mg by mouth every evening  levothyroxine  (SYNTHROID ) 50 MCG tablet Take 1 tablet (50 mcg total) by mouth once daily Take on an empty stomach with a glass of water at least 30-60 minutes before breakfast. 30 tablet 3  levothyroxine  (SYNTHROID ) 75 MCG tablet Take 1 tablet (75 mcg total) by mouth once daily Take on an empty stomach with a glass of water at least 30-60 minutes before breakfast. 30 tablet 11  losartan  (COZAAR ) 100 MG tablet TAKE 1 TABLET ONCE DAILY 90 tablet 3  melatonin 3 mg tablet Take by mouth  metoprolol  SUCCinate (TOPROL -XL) 25 MG XL tablet TAKE 1 TABLET ONCE DAILY 90 tablet 3  multivitamin tablet Take 1 tablet by mouth once daily  NON FORMULARY Fruits and Veggies Supplements  omega-3 fatty acids-fish oil 300-1,000 mg capsule Take by mouth  omeprazole  (PRILOSEC) 20 MG DR capsule Take 1 capsule (20 mg total) by mouth once daily  90 capsule 3  predniSONE  (DELTASONE ) 20 MG tablet Take 1 tablet (20 mg total) by mouth once daily 7 tablet 0  RED YEAST RICE ORAL Take 700 mg by mouth once daily  baclofen  (LIORESAL ) 10 MG tablet TAKE 1 TABLET BY MOUTH THREE TIMES DAILY (Patient not taking: Reported on 02/02/2024)  15 tablet 0  gabapentin  (NEURONTIN ) 100 MG capsule Take 100 mg by mouth 2 (two) times daily (Patient not taking: Reported on 02/02/2024)  HYDROcodone -homatropine (HYCODAN) 5-1.5 mg/5 mL syrup Take 5 mLs by mouth every 6 (six) hours as needed for Cough (Patient not taking: Reported on 02/02/2024) 115 mL 0  quinapriL  (ACCUPRIL ) 40 MG tablet (Patient not taking: Reported on 02/02/2024)  sertraline  (ZOLOFT ) 50 MG tablet (Patient not taking: Reported on 02/02/2024)   No facility-administered encounter medications on file as of 02/02/2024.    ALLERGIES:  Patient has no known allergies.  SOCIAL HISTORY:  Social History   Socioeconomic History  Marital status: Widowed  Tobacco Use  Smoking status: Former  Smokeless tobacco: Never  Substance and Sexual Activity  Alcohol use: Defer  Drug use: Defer  Sexual activity: Defer   Social Drivers of Health   Financial Resource Strain: Low Risk (09/15/2023)  Overall Financial Resource Strain (CARDIA)  Difficulty of Paying Living Expenses: Not hard at all  Food Insecurity: No Food Insecurity (09/15/2023)  Hunger Vital Sign  Worried About Running Out of Food in the Last Year: Never true  Ran Out of Food in the Last Year: Never true  Transportation Needs: No Transportation Needs (09/15/2023)  PRAPARE - Risk analyst (Medical): No  Lack of Transportation (Non-Medical): No   FAMILY HISTORY:  No family history on file.   GENERAL REVIEW OF SYSTEMS:   General ROS: negative for - chills, fatigue, fever, weight gain or weight loss Allergy and Immunology ROS: negative for - hives  Hematological and Lymphatic ROS: negative for - bleeding problems or bruising, negative for palpable nodes Endocrine ROS: negative for - heat or cold intolerance, hair changes Respiratory ROS: negative for - cough, shortness of breath or wheezing Cardiovascular ROS: no chest pain or palpitations GI ROS: negative for nausea, vomiting, abdominal pain,  diarrhea, constipation Musculoskeletal ROS: negative for - joint swelling or muscle pain Neurological ROS: negative for - confusion, syncope Dermatological ROS: negative for pruritus and rash  PHYSICAL EXAM:  Vitals:  02/02/24 0927  BP: (!) 149/70  Pulse: 63  .  Ht:167.6 cm (5\' 6" ) Wt:92.1 kg (203 lb) WJX:BJYN surface area is 2.07 meters squared. Body mass index is 32.77 kg/m.Aaron Aas  GENERAL: Alert, active, oriented x3  HEENT: Pupils equal reactive to light. Extraocular movements are intact. Sclera clear. Palpebral conjunctiva normal red color.Pharynx clear.  NECK: Supple with no palpable mass and no adenopathy.  LUNGS: Sound clear with no rales rhonchi or wheezes.  HEART: Regular rhythm S1 and S2 without murmur.  BREAST: both examined in the sitting and supine position. There is no palpable masses, skin changes, nipple retraction or nipple discharge. Right breast bruised on the lateral aspect from previous biopsy.  EXTREMITIES: Well-developed well-nourished symmetrical with no dependent edema.  NEUROLOGICAL: Awake alert oriented, facial expression symmetrical, moving all extremities.  Results RADIOLOGY Mammogram: Concerning areas identified  PATHOLOGY Breast biopsy: Positive for malignancy in three areas  Assessment & Plan Breast cancer  Diagnosed with invasive ductal carcinoma after a routine mammogram and biopsies of three areas in the right breast, all positive for cancer. No family history of breast cancer, but  there is a personal history of uterine cancer and a family history of other cancers. Surgical options discussed include lumpectomy, which is preferred, and total mastectomy. Partial mastectomy will involve excision of cancerous areas and surrounding tissue, with potential re-excision if margins are not clear. A sentinel lymph node biopsy will assess for metastasis. Risks include additional surgery if margins are positive and potential lymphatic spread. If multiple  margins are positive, a total mastectomy may be necessary. She understands and agrees to proceed with partial mastectomy. Provided post-operative care instructions, including pain management and activity restrictions. Discuss potential need for re-excision if margins are not clear.  Uterine cancer  Previous uterine cancer was treated with radiation therapy to the pelvic area. No current issues related to uterine cancer.  Malignant neoplasm of upper-outer quadrant of right breast in female, estrogen receptor positive (CMS/HHS-HCC) [C50.411, Z17.0]   Patient and her daughters verbalized understanding, all questions were answered, and were agreeable with the plan outlined above.   Eldred Grego, MD

## 2024-02-08 NOTE — H&P (View-Only) (Signed)
 History of Present Illness Rebecca Patton is a 78 year old female with a history of uterine cancer who presents for evaluation of breast cancer. She was referred by Dr. Randy Buttery for evaluation of breast cancer.  She underwent a routine screening mammogram which revealed concerning areas in her breast. Subsequent imaging with diagnostic mammogram and ultrasound confirmed these findings, leading to three biopsies, all of which returned positive for invasive mammary cancer ER positive, PR positive, Her2 negative. No prior symptoms such as discharge, masses, skin changes, or breast pain were noted before the mammogram. I personally evaluated the images.   She has a history of uterine cancer for which she received radiation treatments to her pelvic area. She denies any hormone therapy post-menopause, which occurred around 2003. She has had two pregnancies and used birth control pills when she got married.  Family history includes her sister having squamous cell carcinoma in her eye, resulting in loss of the eye, and her brother who passed away from pancreatic cancer. She reports no family history of breast cancer.   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  Anxiety 03/20/2015  Arthritis 03/20/2015  Benign neoplasm of colon 03/20/2015  Clinical depression 03/20/2015  Essential (primary) hypertension 03/20/2015  HLD (hyperlipidemia) 03/20/2015  Malignant neoplasm of corpus uteri, except isthmus (CMS/HHS-HCC) 03/20/2015  Osteopenia 03/20/2015  Supraumbilical hernia 05/15/2018     PAST SURGICAL HISTORY:  Past Surgical History:  Procedure Laterality Date  COLONOSCOPY 01/23/2020  Hyperplastic colon polyp/No Repeat due to age/TKT  bunionectomy  HERNIA REPAIR  HYSTERECTOMY  TUBAL LIGATION    MEDICATIONS:  Outpatient Encounter Medications as of 02/02/2024  Medication Sig Dispense Refill  aspirin 81 MG EC tablet Take 81 mg by mouth once daily  aspirin-calcium  carbonate 81 mg-300 mg calcium(777 mg) Tab Take by mouth  betamethasone dipropionate, augmented, (DIPROLENE-AF) 0.05 % cream  celecoxib (CELEBREX) 100 MG capsule TAKE 1 CAPSULE TWICE DAILY 180 capsule 1  coQ10, ubiquinol, (QUNOL MEGA COQ10) 100 mg Cap Take by mouth  cyanocobalamin (VITAMIN B12) 100 MCG tablet Take by mouth  fluticasone  propionate (FLONASE ) 50 mcg/actuation nasal spray USE 2 SPRAYS IN EACH NOSTRIL TWICE DAILY AS NEEDED FOR RHINITIS FOR UP TO 90 DAYS  garlic 1,000 mg Cap Take by mouth  hydroCHLOROthiazide  (HYDRODIURIL ) 25 MG tablet Take 1 tablet (25 mg total) by mouth once daily 10 tablet 0  ibuprofen  (MOTRIN ) 600 MG tablet Take by mouth  Lactobac no.41/Bifidobact no.7 (PROBIOTIC-10 ORAL) Take 1 capsule by mouth once daily  levocetirizine (XYZAL ) 5 MG tablet Take 5 mg by mouth every evening  levothyroxine  (SYNTHROID ) 50 MCG tablet Take 1 tablet (50 mcg total) by mouth once daily Take on an empty stomach with a glass of water at least 30-60 minutes before breakfast. 30 tablet 3  levothyroxine  (SYNTHROID ) 75 MCG tablet Take 1 tablet (75 mcg total) by mouth once daily Take on an empty stomach with a glass of water at least 30-60 minutes before breakfast. 30 tablet 11  losartan  (COZAAR ) 100 MG tablet TAKE 1 TABLET ONCE DAILY 90 tablet 3  melatonin 3 mg tablet Take by mouth  metoprolol  SUCCinate (TOPROL -XL) 25 MG XL tablet TAKE 1 TABLET ONCE DAILY 90 tablet 3  multivitamin tablet Take 1 tablet by mouth once daily  NON FORMULARY Fruits and Veggies Supplements  omega-3 fatty acids-fish oil 300-1,000 mg capsule Take by mouth  omeprazole  (PRILOSEC) 20 MG DR capsule Take 1 capsule (20 mg total) by mouth once daily  90 capsule 3  predniSONE  (DELTASONE ) 20 MG tablet Take 1 tablet (20 mg total) by mouth once daily 7 tablet 0  RED YEAST RICE ORAL Take 700 mg by mouth once daily  baclofen  (LIORESAL ) 10 MG tablet TAKE 1 TABLET BY MOUTH THREE TIMES DAILY (Patient not taking: Reported on 02/02/2024)  15 tablet 0  gabapentin  (NEURONTIN ) 100 MG capsule Take 100 mg by mouth 2 (two) times daily (Patient not taking: Reported on 02/02/2024)  HYDROcodone -homatropine (HYCODAN) 5-1.5 mg/5 mL syrup Take 5 mLs by mouth every 6 (six) hours as needed for Cough (Patient not taking: Reported on 02/02/2024) 115 mL 0  quinapriL  (ACCUPRIL ) 40 MG tablet (Patient not taking: Reported on 02/02/2024)  sertraline  (ZOLOFT ) 50 MG tablet (Patient not taking: Reported on 02/02/2024)   No facility-administered encounter medications on file as of 02/02/2024.    ALLERGIES:  Patient has no known allergies.  SOCIAL HISTORY:  Social History   Socioeconomic History  Marital status: Widowed  Tobacco Use  Smoking status: Former  Smokeless tobacco: Never  Substance and Sexual Activity  Alcohol use: Defer  Drug use: Defer  Sexual activity: Defer   Social Drivers of Health   Financial Resource Strain: Low Risk (09/15/2023)  Overall Financial Resource Strain (CARDIA)  Difficulty of Paying Living Expenses: Not hard at all  Food Insecurity: No Food Insecurity (09/15/2023)  Hunger Vital Sign  Worried About Running Out of Food in the Last Year: Never true  Ran Out of Food in the Last Year: Never true  Transportation Needs: No Transportation Needs (09/15/2023)  PRAPARE - Risk analyst (Medical): No  Lack of Transportation (Non-Medical): No   FAMILY HISTORY:  No family history on file.   GENERAL REVIEW OF SYSTEMS:   General ROS: negative for - chills, fatigue, fever, weight gain or weight loss Allergy and Immunology ROS: negative for - hives  Hematological and Lymphatic ROS: negative for - bleeding problems or bruising, negative for palpable nodes Endocrine ROS: negative for - heat or cold intolerance, hair changes Respiratory ROS: negative for - cough, shortness of breath or wheezing Cardiovascular ROS: no chest pain or palpitations GI ROS: negative for nausea, vomiting, abdominal pain,  diarrhea, constipation Musculoskeletal ROS: negative for - joint swelling or muscle pain Neurological ROS: negative for - confusion, syncope Dermatological ROS: negative for pruritus and rash  PHYSICAL EXAM:  Vitals:  02/02/24 0927  BP: (!) 149/70  Pulse: 63  .  Ht:167.6 cm (5\' 6" ) Wt:92.1 kg (203 lb) WJX:BJYN surface area is 2.07 meters squared. Body mass index is 32.77 kg/m.Aaron Aas  GENERAL: Alert, active, oriented x3  HEENT: Pupils equal reactive to light. Extraocular movements are intact. Sclera clear. Palpebral conjunctiva normal red color.Pharynx clear.  NECK: Supple with no palpable mass and no adenopathy.  LUNGS: Sound clear with no rales rhonchi or wheezes.  HEART: Regular rhythm S1 and S2 without murmur.  BREAST: both examined in the sitting and supine position. There is no palpable masses, skin changes, nipple retraction or nipple discharge. Right breast bruised on the lateral aspect from previous biopsy.  EXTREMITIES: Well-developed well-nourished symmetrical with no dependent edema.  NEUROLOGICAL: Awake alert oriented, facial expression symmetrical, moving all extremities.  Results RADIOLOGY Mammogram: Concerning areas identified  PATHOLOGY Breast biopsy: Positive for malignancy in three areas  Assessment & Plan Breast cancer  Diagnosed with invasive ductal carcinoma after a routine mammogram and biopsies of three areas in the right breast, all positive for cancer. No family history of breast cancer, but  there is a personal history of uterine cancer and a family history of other cancers. Surgical options discussed include lumpectomy, which is preferred, and total mastectomy. Partial mastectomy will involve excision of cancerous areas and surrounding tissue, with potential re-excision if margins are not clear. A sentinel lymph node biopsy will assess for metastasis. Risks include additional surgery if margins are positive and potential lymphatic spread. If multiple  margins are positive, a total mastectomy may be necessary. She understands and agrees to proceed with partial mastectomy. Provided post-operative care instructions, including pain management and activity restrictions. Discuss potential need for re-excision if margins are not clear.  Uterine cancer  Previous uterine cancer was treated with radiation therapy to the pelvic area. No current issues related to uterine cancer.  Malignant neoplasm of upper-outer quadrant of right breast in female, estrogen receptor positive (CMS/HHS-HCC) [C50.411, Z17.0]   Patient and her daughters verbalized understanding, all questions were answered, and were agreeable with the plan outlined above.   Eldred Grego, MD

## 2024-02-12 ENCOUNTER — Encounter
Admission: RE | Admit: 2024-02-12 | Discharge: 2024-02-12 | Disposition: A | Discharge status: Home / Self Care | Source: Ambulatory Visit | Attending: General Surgery | Admitting: General Surgery

## 2024-02-12 DIAGNOSIS — Z01812 Encounter for preprocedural laboratory examination: Secondary | ICD-10-CM

## 2024-02-12 DIAGNOSIS — R000 Tachycardia, unspecified: Secondary | ICD-10-CM

## 2024-02-12 DIAGNOSIS — Z01810 Encounter for preprocedural cardiovascular examination: Secondary | ICD-10-CM

## 2024-02-12 DIAGNOSIS — I10 Essential (primary) hypertension: Secondary | ICD-10-CM

## 2024-02-12 HISTORY — DX: Estrogen receptor positive status (ER+): Z17.0

## 2024-02-12 HISTORY — DX: Calculus of gallbladder without cholecystitis without obstruction: K80.20

## 2024-02-12 HISTORY — DX: Estrogen receptor positive status (ER+): C50.411

## 2024-02-12 HISTORY — DX: Hyperlipidemia, unspecified: E78.5

## 2024-02-12 HISTORY — DX: Prediabetes: R73.03

## 2024-02-12 HISTORY — DX: Ventral hernia without obstruction or gangrene: K43.9

## 2024-02-12 HISTORY — DX: Anxiety disorder, unspecified: F41.9

## 2024-02-12 NOTE — Patient Instructions (Addendum)
 Your procedure is scheduled on:02-19-24 Monday Report to [facility] @ 7:45 am   REMEMBER: Instructions that are not followed completely may result in serious medical risk, up to and including death; or upon the discretion of your surgeon and anesthesiologist your surgery may need to be rescheduled.  Do not eat food OR drink any liquidsafter midnight the night before surgery.  No gum chewing or hard candies.  One week prior to surgery:Stop NOW (02-12-24) Stop Anti-inflammatories (NSAIDS) such as Advil , Aleve, Ibuprofen , Motrin , Naproxen, Naprosyn and Aspirin based products such as Excedrin, Goody's Powder, BC Powder. Continue your celecoxib (CELEBREX) up until the day prior to surgery Stop ANY OVER THE COUNTER supplements until after surgery (Ashwagandha, Collagen, Garlic, Multivitamin, Red Yeast Rice, Vitamin B12)  You may however, continue to take Tylenol  if needed for pain up until the day of surgery.  Stop 81 mg Aspirin 5 days prior to surgery-Last dose will be on 02-13-24 Tuesday  Continue taking all of your other prescription medications up until the day of surgery.  ON THE DAY OF SURGERY ONLY TAKE THESE MEDICATIONS WITH SIPS OF WATER: -levothyroxine  (SYNTHROID )  -metoprolol  succinate (TOPROL -XL) -omeprazole  (PRILOSEC)   No Alcohol for 24 hours before or after surgery.  No Smoking including e-cigarettes for 24 hours before surgery.  No chewable tobacco products for at least 6 hours before surgery.  No nicotine patches on the day of surgery.  Do not use any "recreational" drugs for at least a week (preferably 2 weeks) before your surgery.  Please be advised that the combination of cocaine and anesthesia may have negative outcomes, up to and including death. If you test positive for cocaine, your surgery will be cancelled.  On the morning of surgery brush your teeth with toothpaste and water, you may rinse your mouth with mouthwash if you wish. Do not swallow any  toothpaste or mouthwash.  Use CHG Soap as directed on instruction sheet.  Do not wear jewelry, make-up, hairpins, clips or nail polish.  For welded (permanent) jewelry: bracelets, anklets, waist bands, etc.  Please have this removed prior to surgery.  If it is not removed, there is a chance that hospital personnel will need to cut it off on the day of surgery.  Do not wear lotions, powders, or perfumes.   Do not shave body hair from the neck down 48 hours before surgery.  Contact lenses, hearing aids and dentures may not be worn into surgery.  Do not bring valuables to the hospital. [facility] is not responsible for any missing/lost belongings or valuables.   Notify your doctor if there is any change in your medical condition (cold, fever, infection).  Wear comfortable clothing (specific to your surgery type) to the hospital.  After surgery, you can help prevent lung complications by doing breathing exercises.  Take deep breaths and cough every 1-2 hours. Your doctor may order a device called an Incentive Spirometer to help you take deep breaths. When coughing or sneezing, hold a pillow firmly against your incision with both hands. This is called "splinting." Doing this helps protect your incision. It also decreases belly discomfort.  If you are being admitted to the hospital overnight, leave your suitcase in the car. After surgery it may be brought to your room.  In case of increased patient census, it may be necessary for you, the patient, to continue your postoperative care in the Same Day Surgery department.  If you are being discharged the day of surgery, you will not  be allowed to drive home. You will need a responsible individual to drive you home and stay with you for 24 hours after surgery.   If you are taking public transportation, you will need to have a responsible individual with you.  Please call the Pre-admissions Testing Dept. at [phone] if you have any  questions about these instructions.  Surgery Visitation Policy:  Patients having surgery or a procedure may have two visitors.  Children under the age of 74 must have an adult with them who is not the patient.     Preparing for Surgery with CHLORHEXIDINE  GLUCONATE (CHG) Soap  Chlorhexidine  Gluconate (CHG) Soap  o An antiseptic cleaner that kills germs and bonds with the skin to continue killing germs even after washing  o Used for showering the night before surgery and morning of surgery  Before surgery, you can play an important role by reducing the number of germs on your skin.  CHG (Chlorhexidine  gluconate) soap is an antiseptic cleanser which kills germs and bonds with the skin to continue killing germs even after washing.  Please do not use if you have an allergy to CHG or antibacterial soaps. If your skin becomes reddened/irritated stop using the CHG.  1. Shower the NIGHT BEFORE SURGERY and the MORNING OF SURGERY with CHG soap.  2. If you choose to wash your hair, wash your hair first as usual with your normal shampoo.  3. After shampooing, rinse your hair and body thoroughly to remove the shampoo.  4. Use CHG as you would any other liquid soap. You can apply CHG directly to the skin and wash gently with a scrungie or a clean washcloth.  5. Apply the CHG soap to your body only from the neck down. Do not use on open wounds or open sores. Avoid contact with your eyes, ears, mouth, and genitals (private parts). Wash face and genitals (private parts) with your normal soap.  6. Wash thoroughly, paying special attention to the area where your surgery will be performed.  7. Thoroughly rinse your body with warm water.  8. Do not shower/wash with your normal soap after using and rinsing off the CHG soap.  9. Pat yourself dry with a clean towel.  10. Wear clean pajamas to bed the night before surgery.  12. Place clean sheets on your bed the night of your first shower and do not  sleep with pets.  13. Shower again with the CHG soap on the day of surgery prior to arriving at the hospital.  14. Do not apply any deodorants/lotions/powders.  15. Please wear clean clothes to the hospital.

## 2024-02-13 ENCOUNTER — Encounter
Admission: RE | Admit: 2024-02-13 | Discharge: 2024-02-13 | Disposition: A | Discharge status: Home / Self Care | Source: Ambulatory Visit | Attending: General Surgery | Admitting: General Surgery

## 2024-02-13 DIAGNOSIS — I10 Essential (primary) hypertension: Secondary | ICD-10-CM | POA: Insufficient documentation

## 2024-02-13 DIAGNOSIS — Z01818 Encounter for other preprocedural examination: Secondary | ICD-10-CM | POA: Insufficient documentation

## 2024-02-13 DIAGNOSIS — Z01810 Encounter for preprocedural cardiovascular examination: Secondary | ICD-10-CM

## 2024-02-13 DIAGNOSIS — R000 Tachycardia, unspecified: Secondary | ICD-10-CM | POA: Diagnosis not present

## 2024-02-13 DIAGNOSIS — Z01812 Encounter for preprocedural laboratory examination: Secondary | ICD-10-CM

## 2024-02-13 LAB — CBC
HCT: 42.4 % (ref 36.0–46.0)
Hemoglobin: 14.4 g/dL (ref 12.0–15.0)
MCH: 29.9 pg (ref 26.0–34.0)
MCHC: 34 g/dL (ref 30.0–36.0)
MCV: 88 fL (ref 80.0–100.0)
Platelets: 248 10*3/uL (ref 150–400)
RBC: 4.82 MIL/uL (ref 3.87–5.11)
RDW: 13.5 % (ref 11.5–15.5)
WBC: 8.9 10*3/uL (ref 4.0–10.5)
nRBC: 0 % (ref 0.0–0.2)

## 2024-02-13 LAB — BASIC METABOLIC PANEL WITH GFR
Anion gap: 13 (ref 5–15)
BUN: 23 mg/dL (ref 8–23)
CO2: 23 mmol/L (ref 22–32)
Calcium: 9 mg/dL (ref 8.9–10.3)
Chloride: 101 mmol/L (ref 98–111)
Creatinine, Ser: 0.94 mg/dL (ref 0.44–1.00)
GFR, Estimated: >60 mL/min (ref >60)
Glucose, Bld: 102 mg/dL — ABNORMAL HIGH (ref 70–99)
Potassium: 4 mmol/L (ref 3.5–5.1)
Sodium: 137 mmol/L (ref 135–145)

## 2024-02-14 ENCOUNTER — Inpatient Hospital Stay: Attending: Oncology | Admitting: Hospice and Palliative Medicine

## 2024-02-14 ENCOUNTER — Ambulatory Visit: Payer: Self-pay | Admitting: Licensed Clinical Social Worker

## 2024-02-14 ENCOUNTER — Telehealth: Payer: Self-pay | Admitting: Licensed Clinical Social Worker

## 2024-02-14 DIAGNOSIS — C50411 Malignant neoplasm of upper-outer quadrant of right female breast: Secondary | ICD-10-CM | POA: Insufficient documentation

## 2024-02-14 DIAGNOSIS — E785 Hyperlipidemia, unspecified: Secondary | ICD-10-CM | POA: Insufficient documentation

## 2024-02-14 DIAGNOSIS — E039 Hypothyroidism, unspecified: Secondary | ICD-10-CM | POA: Insufficient documentation

## 2024-02-14 DIAGNOSIS — Z1379 Encounter for other screening for genetic and chromosomal anomalies: Secondary | ICD-10-CM | POA: Insufficient documentation

## 2024-02-14 DIAGNOSIS — Z170 Estrogen receptor positive status [ER+]: Secondary | ICD-10-CM | POA: Insufficient documentation

## 2024-02-14 NOTE — Telephone Encounter (Signed)
 I contacted Rebecca Patton to discuss her genetic testing results. No pathogenic variants were identified in the 40 genes analyzed. Detailed clinic note to follow.   The test report has been scanned into EPIC and is located under the Molecular Pathology section of the Results Review tab.  A portion of the result report is included below for reference.      [name], MS, [facility] Genetic Counselor [email or web address] Phone: [phone]

## 2024-02-14 NOTE — Progress Notes (Signed)
 HPI:   Rebecca Patton was previously seen in the [facility] due to a personal and family history of cancer and concerns regarding a hereditary predisposition to cancer. Please refer to our prior cancer genetics clinic note for more information regarding our discussion, assessment and recommendations, at the time. Rebecca Patton recent genetic test results were disclosed to her, as were recommendations warranted by these results. These results and recommendations are discussed in more detail below.  CANCER HISTORY:  Oncology History  Malignant neoplasm of upper-outer quadrant of right breast in female, estrogen receptor positive (HCC)  02/01/2024 Initial Diagnosis   Malignant neoplasm of upper-outer quadrant of right breast in female, estrogen receptor positive (HCC)   02/01/2024 Cancer Staging   Staging form: Breast, AJCC 8th Edition - Clinical stage from 02/01/2024: Stage IB (cT2, cN0, cM0, G2, ER+, PR+, HER2-) - Signed by Avonne Boettcher, MD on 02/01/2024 Method of lymph node assessment: Clinical Nuclear grade: G2 Histologic grading system: 3 grade system    Genetic Testing   Negative genetic testing. No pathogenic variants identified on the [facility] Cancer[facility]+RNA panel. The report date is 02/14/2024.  The [facility] Cancer[facility]+RNAinsight Panel includes sequencing, rearrangement analysis, and RNA analysis for the following 40 genes: APC, ATM, BAP1, BARD1, BMPR1A, BRCA1, BRCA2, BRIP1, CDH1, CDKN2A, CHEK2, FH, FLCN, MET, MLH1, MSH2, MSH6, MUTYH, NF1, NTHL1, PALB2, PMS2, PTEN, RAD51C, RAD51D, RPS20, SMAD4, STK11, TP53, TSC1, TSC2, and VHL (sequencing and deletion/duplication); AXIN2, HOXB13, MBD4, MSH3, POLD1 and POLE (sequencing only); EPCAM and GREM1 (deletion/duplication only).     FAMILY HISTORY:  We obtained a detailed, 4-generation family history.  Significant diagnoses are listed below: Family History  Problem Relation Age of Onset   Parkinson's disease Mother    Diabetes Father     CAD Father    Cancer Sister        squamous cell cancer in her eye   Cancer Brother    Pancreatic cancer Brother    Tremor Brother    Breast cancer Neg Hx    Rebecca Patton has 2 daughters, ages 79 and 58, no history of cancer. She had 3 brothers and 1 sister. One brother had pancreatic cancer in his 21s and passed of it. Her sister had eye cancer around age 36 and is living at 57.    Rebecca Patton is unaware of any other cancer in the family.   Rebecca Patton is unaware of previous family history of genetic testing for hereditary cancer risks. There is no reported Ashkenazi Jewish ancestry. There is no known consanguinity.      GENETIC TEST RESULTS:  The [facility]+RNA Panel found no pathogenic mutations.   The [facility] CancerNext+RNAinsight Panel includes sequencing, rearrangement analysis, and RNA analysis for the following 40 genes: APC, ATM, BAP1, BARD1, BMPR1A, BRCA1, BRCA2, BRIP1, CDH1, CDKN2A, CHEK2, FH, FLCN, MET, MLH1, MSH2, MSH6, MUTYH, NF1, NTHL1, PALB2, PMS2, PTEN, RAD51C, RAD51D, RPS20, SMAD4, STK11, TP53, TSC1, TSC2, and VHL (sequencing and deletion/duplication); AXIN2, HOXB13, MBD4, MSH3, POLD1 and POLE (sequencing only); EPCAM and GREM1 (deletion/duplication only).   The test report has been scanned into EPIC and is located under the Molecular Pathology section of the Results Review tab.  A portion of the result report is included below for reference. Genetic testing reported out on 02/14/2024.      Even though a pathogenic variant was not identified, possible explanations for the cancer in the family may include: There may be no hereditary risk for cancer in the family. The cancers  in Rebecca Patton and/or her family may be sporadic/familial or due to other genetic and environmental factors. There may be a gene mutation in one of these genes that current testing methods cannot detect but that chance is small. There could be another gene that has not yet been discovered, or that  we have not yet tested, that is responsible for the cancer diagnoses in the family.  It is also possible there is a hereditary cause for the cancer in the family that Rebecca Patton did not inherit.  Therefore, it is important to remain in touch with cancer genetics in the future so that we can continue to offer Rebecca Patton the most up to date genetic testing.   ADDITIONAL GENETIC TESTING:  We discussed with Rebecca Patton that her genetic testing was fairly extensive.  If there are additional relevant genes identified to increase cancer risk that can be analyzed in the future, we would be happy to discuss and coordinate this testing at that time.    CANCER SCREENING RECOMMENDATIONS:  Rebecca Patton test result is considered negative (normal).  This means that we have not identified a hereditary cause for her personal and family history of cancer at this time.   An individual's cancer risk and medical management are not determined by genetic test results alone. Overall cancer risk assessment incorporates additional factors, including personal medical history, family history, and any available genetic information that may result in a personalized plan for cancer prevention and surveillance. Therefore, it is recommended she continue to follow the cancer management and screening guidelines provided by her oncology and primary healthcare provider.  RECOMMENDATIONS FOR FAMILY MEMBERS:   Since she did not inherit a identifiable mutation in a cancer predisposition gene included on this panel, her children could not have inherited a known mutation from her in one of these genes. Individuals in this family might be at some increased risk of developing cancer, over the general population risk, due to the family history of cancer.  Individuals in the family should notify their providers of the family history of cancer. We recommend women in this family have a yearly mammogram beginning at age 35, or 47 years younger than  the earliest onset of cancer, an annual clinical breast exam, and perform monthly breast self-exams.  Family members should have colonoscopies by at age 76, or earlier, as recommended by their providers. Other members of the family may still carry a pathogenic variant in one of these genes that Rebecca Patton did not inherit. Based on the family history, we recommend the children of her brother who had pancreatic cancer have genetic counseling and testing. Rebecca Patton will let us  know if we can be of any assistance in coordinating genetic counseling and/or testing for this family member.    FOLLOW-UP:  Lastly, we discussed with Rebecca Patton that cancer genetics is a rapidly advancing field and it is possible that new genetic tests will be appropriate for her and/or her family members in the future. We encouraged her to remain in contact with cancer genetics on an annual basis so we can update her personal and family histories and let her know of advances in cancer genetics that may benefit this family.   Our contact number was provided. Ms. Seith questions were answered to her satisfaction, and she knows she is welcome to call us  at anytime with additional questions or concerns.    [name], MS, [facility] Genetic Counselor [email or web address] Phone: [phone]

## 2024-02-14 NOTE — Progress Notes (Signed)
 Multidisciplinary Oncology Council Documentation  Rebecca Patton was presented by our [facility] on 02/14/2024, which included representatives from:  Palliative Care Dietitian  Physical/Occupational Therapist Nurse Navigator Genetics Social work Survivorship RN Financial Navigator Research RN   Tashawn currently presents with history of breast cancer  We reviewed previous medical and familial history, history of present illness, and recent lab results along with all available histopathologic and imaging studies. The MOC considered available treatment options and made the following recommendations/referrals:  SW, rehab screening  The MOC is a meeting of clinicians from various specialty areas who evaluate and discuss patients for whom a multidisciplinary approach is being considered. Final determinations in the plan of care are those of the provider(s).   Today's extended care, comprehensive team conference, Rebecca Patton was not present for the discussion and was not examined.

## 2024-02-15 ENCOUNTER — Inpatient Hospital Stay

## 2024-02-15 NOTE — Progress Notes (Signed)
 [facility] Clinical Social Work  Initial Assessment   Rebecca Patton is a 78 y.o. year old female contacted by phone. Clinical Social Work was referred by [facility] for assessment of psychosocial needs.   SDOH (Social Determinants of Health) assessments performed: Yes SDOH Interventions    Flowsheet Row Clinical Support from 07/29/2021 in [facility] Clinical Support from 07/06/2020 in [facility] Office Visit from 11/21/2019 in [facility]  SDOH Interventions     Food Insecurity Interventions Intervention Not Indicated -- --  Housing Interventions Intervention Not Indicated -- --  Transportation Interventions Intervention Not Indicated -- --  Depression Interventions/Treatment  -- Currently on Treatment Medication  Financial Strain Interventions Intervention Not Indicated -- --  Physical Activity Interventions Intervention Not Indicated Patient Refused --  Stress Interventions Intervention Not Indicated -- --  Social Connections Interventions Intervention Not Indicated -- --       SDOH Screenings   Food Insecurity: No Food Insecurity (02/06/2024)   Received from [facility]  Housing: Unknown (02/06/2024)   Received from [facility]  Transportation Needs: No Transportation Needs (02/06/2024)   Received from [facility]  Utilities: Not At Risk (02/06/2024)   Received from [facility]  Alcohol Screen: Low Risk  (06/07/2022)  Depression (PHQ2-9): Low Risk  (06/07/2022)  Financial Resource Strain: Low Risk  (02/06/2024)   Received from [facility]  Physical Activity: Inactive (09/15/2023)   Received from [facility]  Social Connections: Moderately Isolated (07/29/2021)  Stress: No Stress Concern Present (07/29/2021)  Tobacco Use: Medium Risk (02/12/2024)  Health Literacy: Adequate Health Literacy (09/15/2023)    Received from [facility]     Distress Screen completed: No    01/30/2024    2:33 PM  ONCBCN DISTRESS SCREENING  Screening Type Initial Screening  How much distress have you been experiencing in the past week? (0-10) 0      Family/Social Information:  Housing Arrangement: patient lives alone Family members/support persons in your life? Family and Friends.  Patient has two daughters who are supportive. Transportation concerns: no  Employment: Working part time for [employer].  She stays with her grandson about 10 hours a week who has cerebral palsy. Income source: Employment and Actor concerns: Yes, due to illness and/or loss of work during treatment Type of concern: Rent/ mortgage Food access concerns: no Religious or spiritual practice: No Advanced directives: Yes-has HCPOA in chart. Services Currently in place:  Medicare  Coping/ Adjustment to diagnosis: Patient understands treatment plan and what happens next? yes Concerns about diagnosis and/or treatment: I'm not especially worried about anything Patient reported stressors: Therapist, art and/or priorities: Family Patient enjoys time with family/ friends Current coping skills/ strengths: Capable of independent living , Manufacturing systems engineer , Radio producer fund of knowledge , Motivation for treatment/growth , and Supportive family/friends     SUMMARY: Current SDOH Barriers:  Financial constraints related to fixed income.  Clinical Social Work Clinical Goal(s):  Explore community resource options for unmet needs related to:  Financial Strain   Interventions: Discussed common feeling and emotions when being diagnosed with cancer, and the importance of support during treatment Informed patient of the support team roles and support services at [facility] Provided CSW contact information and encouraged patient to call with any questions or concerns Provided patient with information about  the [employer] and it's requirements.  Patient meets financial requirements.  She will apply for food stamps.  Will mail breast cancer grant information and [employer].   Follow Up Plan: CSW will follow-up with patient by phone  Patient verbalizes understanding of plan: Yes    Kennth Peal, LCSW Clinical Social Worker [facility]

## 2024-02-16 NOTE — Addendum Note (Signed)
 Addended by: Gerilyn Kobus R on: 02/16/2024 11:38 AM   Modules accepted: Level of Service

## 2024-02-18 MED ORDER — ORAL CARE MOUTH RINSE: 15.0000 mL | Freq: Once | OROMUCOSAL | Status: AC

## 2024-02-18 MED ORDER — CEFAZOLIN SODIUM-DEXTROSE 2-4 GM/100ML-% IV SOLN
2.0000 g | INTRAVENOUS | Status: AC
  Administered 2024-02-19: 2 g via INTRAVENOUS

## 2024-02-18 MED ORDER — LACTATED RINGERS IV SOLN: INTRAVENOUS | Status: DC

## 2024-02-18 MED ORDER — CHLORHEXIDINE GLUCONATE 0.12 % MT SOLN
15.0000 mL | Freq: Once | OROMUCOSAL | Status: AC
  Administered 2024-02-19: 15 mL via OROMUCOSAL

## 2024-02-19 ENCOUNTER — Ambulatory Visit
Admission: RE | Admit: 2024-02-19 | Discharge: 2024-02-19 | Disposition: A | Discharge status: Home / Self Care | Attending: General Surgery | Admitting: General Surgery

## 2024-02-19 ENCOUNTER — Ambulatory Visit

## 2024-02-19 ENCOUNTER — Ambulatory Visit: Payer: Self-pay | Admitting: Urgent Care

## 2024-02-19 ENCOUNTER — Encounter
Admission: RE | Disposition: A | Discharge status: Home / Self Care | Payer: Self-pay | Source: Home / Self Care | Attending: General Surgery

## 2024-02-19 ENCOUNTER — Ambulatory Visit
Admission: RE | Admit: 2024-02-19 | Discharge: 2024-02-19 | Disposition: A | Discharge status: Home / Self Care | Source: Ambulatory Visit | Attending: General Surgery | Admitting: General Surgery

## 2024-02-19 ENCOUNTER — Ambulatory Visit
Admission: RE | Admit: 2024-02-19 | Discharge: 2024-02-19 | Disposition: A | Discharge status: Home / Self Care | Source: Ambulatory Visit | Attending: General Surgery

## 2024-02-19 ENCOUNTER — Encounter
Admission: RE | Admit: 2024-02-19 | Discharge: 2024-02-19 | Disposition: A | Discharge status: Home / Self Care | Source: Ambulatory Visit | Attending: General Surgery

## 2024-02-19 ENCOUNTER — Encounter: Payer: Self-pay | Admitting: General Surgery

## 2024-02-19 ENCOUNTER — Other Ambulatory Visit: Payer: Self-pay

## 2024-02-19 DIAGNOSIS — Z170 Estrogen receptor positive status [ER+]: Secondary | ICD-10-CM | POA: Diagnosis not present

## 2024-02-19 DIAGNOSIS — C50411 Malignant neoplasm of upper-outer quadrant of right female breast: Secondary | ICD-10-CM | POA: Diagnosis not present

## 2024-02-19 DIAGNOSIS — C50919 Malignant neoplasm of unspecified site of unspecified female breast: Secondary | ICD-10-CM

## 2024-02-19 DIAGNOSIS — Z1732 Human epidermal growth factor receptor 2 negative status: Secondary | ICD-10-CM | POA: Diagnosis not present

## 2024-02-19 DIAGNOSIS — C50911 Malignant neoplasm of unspecified site of right female breast: Secondary | ICD-10-CM | POA: Diagnosis not present

## 2024-02-19 DIAGNOSIS — Z1721 Progesterone receptor positive status: Secondary | ICD-10-CM | POA: Insufficient documentation

## 2024-02-19 DIAGNOSIS — N6041 Mammary duct ectasia of right breast: Secondary | ICD-10-CM | POA: Insufficient documentation

## 2024-02-19 DIAGNOSIS — K219 Gastro-esophageal reflux disease without esophagitis: Secondary | ICD-10-CM | POA: Diagnosis not present

## 2024-02-19 DIAGNOSIS — Z923 Personal history of irradiation: Secondary | ICD-10-CM | POA: Diagnosis not present

## 2024-02-19 DIAGNOSIS — C773 Secondary and unspecified malignant neoplasm of axilla and upper limb lymph nodes: Secondary | ICD-10-CM | POA: Diagnosis not present

## 2024-02-19 DIAGNOSIS — I10 Essential (primary) hypertension: Secondary | ICD-10-CM | POA: Diagnosis not present

## 2024-02-19 DIAGNOSIS — Z8542 Personal history of malignant neoplasm of other parts of uterus: Secondary | ICD-10-CM | POA: Insufficient documentation

## 2024-02-19 DIAGNOSIS — Z87891 Personal history of nicotine dependence: Secondary | ICD-10-CM | POA: Insufficient documentation

## 2024-02-19 HISTORY — PX: BREAST BIOPSY: SHX20

## 2024-02-19 HISTORY — PX: AXILLARY SENTINEL NODE BIOPSY: SHX5738

## 2024-02-19 HISTORY — PX: BREAST LUMPECTOMY: SHX2

## 2024-02-19 SURGERY — BREAST LUMPECTOMY
Anesthesia: General | Site: Breast | Laterality: Right

## 2024-02-19 MED ORDER — ACETAMINOPHEN 10 MG/ML IV SOLN
INTRAVENOUS | Status: DC | PRN
  Administered 2024-02-19: 1000 mg via INTRAVENOUS

## 2024-02-19 MED ORDER — EPHEDRINE 5 MG/ML INJ
INTRAVENOUS | Status: AC
  Filled 2024-02-19: qty 5

## 2024-02-19 MED ORDER — OXYCODONE HCL 5 MG PO TABS
5.0000 mg | ORAL_TABLET | Freq: Once | ORAL | Status: AC | PRN
  Administered 2024-02-19: 5 mg via ORAL

## 2024-02-19 MED ORDER — HEMOSTATIC AGENTS (NO CHARGE) OPTIME
TOPICAL | Status: DC | PRN
  Administered 2024-02-19: 1 via TOPICAL

## 2024-02-19 MED ORDER — HYDROMORPHONE HCL 1 MG/ML IJ SOLN
INTRAMUSCULAR | Status: DC | PRN
  Administered 2024-02-19: .5 mg via INTRAVENOUS

## 2024-02-19 MED ORDER — ONDANSETRON HCL 4 MG/2ML IJ SOLN
INTRAMUSCULAR | Status: DC | PRN
  Administered 2024-02-19: 4 mg via INTRAVENOUS

## 2024-02-19 MED ORDER — EPHEDRINE SULFATE-NACL 50-0.9 MG/10ML-% IV SOSY
PREFILLED_SYRINGE | INTRAVENOUS | Status: DC | PRN
  Administered 2024-02-19: 10 mg via INTRAVENOUS
  Administered 2024-02-19: 5 mg via INTRAVENOUS
  Administered 2024-02-19 (×2): 10 mg via INTRAVENOUS

## 2024-02-19 MED ORDER — STERILE WATER FOR IRRIGATION IR SOLN
Status: DC | PRN
  Administered 2024-02-19: 500 mL

## 2024-02-19 MED ORDER — OXYCODONE HCL 5 MG/5ML PO SOLN: 5.0000 mg | Freq: Once | ORAL | Status: AC | PRN

## 2024-02-19 MED ORDER — ONDANSETRON HCL 4 MG/2ML IJ SOLN
INTRAMUSCULAR | Status: AC
Start: 2024-02-19 — End: ?
  Filled 2024-02-19: qty 2

## 2024-02-19 MED ORDER — TECHNETIUM TC 99M TILMANOCEPT KIT
1.0100 | PACK | Freq: Once | INTRAVENOUS | Status: AC | PRN
  Administered 2024-02-19: 1.01 via INTRADERMAL

## 2024-02-19 MED ORDER — ACETAMINOPHEN 10 MG/ML IV SOLN
INTRAVENOUS | Status: AC
  Filled 2024-02-19: qty 100

## 2024-02-19 MED ORDER — LIDOCAINE HCL (CARDIAC) PF 100 MG/5ML IV SOSY
PREFILLED_SYRINGE | INTRAVENOUS | Status: DC | PRN
  Administered 2024-02-19: 80 mg via INTRAVENOUS

## 2024-02-19 MED ORDER — FENTANYL CITRATE (PF) 100 MCG/2ML IJ SOLN
INTRAMUSCULAR | Status: DC | PRN
  Administered 2024-02-19: 25 ug via INTRAVENOUS
  Administered 2024-02-19: 50 ug via INTRAVENOUS
  Administered 2024-02-19: 25 ug via INTRAVENOUS

## 2024-02-19 MED ORDER — METHYLENE BLUE (ANTIDOTE) 1 % IV SOLN
INTRAVENOUS | Status: AC
  Filled 2024-02-19: qty 10

## 2024-02-19 MED ORDER — HYDROCODONE-ACETAMINOPHEN 5-325 MG PO TABS: 1.0000 | ORAL_TABLET | Freq: Four times a day (QID) | ORAL | 0 refills | Status: AC | PRN

## 2024-02-19 MED ORDER — FENTANYL CITRATE (PF) 100 MCG/2ML IJ SOLN: 25.0000 ug | INTRAMUSCULAR | Status: DC | PRN

## 2024-02-19 MED ORDER — DEXAMETHASONE SODIUM PHOSPHATE 10 MG/ML IJ SOLN
INTRAMUSCULAR | Status: DC | PRN
  Administered 2024-02-19: 5 mg via INTRAVENOUS

## 2024-02-19 MED ORDER — CEFAZOLIN SODIUM-DEXTROSE 2-4 GM/100ML-% IV SOLN
INTRAVENOUS | Status: AC
Start: 2024-02-19 — End: ?
  Filled 2024-02-19: qty 100

## 2024-02-19 MED ORDER — CHLORHEXIDINE GLUCONATE 0.12 % MT SOLN
OROMUCOSAL | Status: AC
Start: 2024-02-19 — End: ?
  Filled 2024-02-19: qty 15

## 2024-02-19 MED ORDER — BUPIVACAINE-EPINEPHRINE (PF) 0.5% -1:200000 IJ SOLN
INTRAMUSCULAR | Status: AC
  Filled 2024-02-19: qty 30

## 2024-02-19 MED ORDER — OXYCODONE HCL 5 MG PO TABS
ORAL_TABLET | ORAL | Status: AC
  Filled 2024-02-19: qty 1

## 2024-02-19 MED ORDER — PROPOFOL 10 MG/ML IV BOLUS
INTRAVENOUS | Status: DC | PRN
  Administered 2024-02-19: 150 mg via INTRAVENOUS

## 2024-02-19 MED ORDER — MIDAZOLAM HCL 2 MG/2ML IJ SOLN
INTRAMUSCULAR | Status: DC | PRN
  Administered 2024-02-19: 2 mg via INTRAVENOUS

## 2024-02-19 MED ORDER — LIDOCAINE HCL (PF) 2 % IJ SOLN
INTRAMUSCULAR | Status: AC
Start: 2024-02-19 — End: ?
  Filled 2024-02-19: qty 5

## 2024-02-19 MED ORDER — FENTANYL CITRATE (PF) 100 MCG/2ML IJ SOLN
INTRAMUSCULAR | Status: AC
  Filled 2024-02-19: qty 2

## 2024-02-19 MED ORDER — BUPIVACAINE-EPINEPHRINE 0.5% -1:200000 IJ SOLN
INTRAMUSCULAR | Status: DC | PRN
  Administered 2024-02-19: 20 mL
  Administered 2024-02-19: 10 mL

## 2024-02-19 MED ORDER — DEXAMETHASONE SODIUM PHOSPHATE 10 MG/ML IJ SOLN
INTRAMUSCULAR | Status: AC
  Filled 2024-02-19: qty 1

## 2024-02-19 MED ORDER — PROPOFOL 10 MG/ML IV BOLUS
INTRAVENOUS | Status: AC
  Filled 2024-02-19: qty 20

## 2024-02-19 MED ORDER — LIDOCAINE HCL 1 % IJ SOLN
15.0000 mL | Freq: Once | INTRAMUSCULAR | Status: AC
Start: 2024-02-19 — End: 2024-02-19
  Administered 2024-02-19: 15 mL
  Filled 2024-02-19: qty 15

## 2024-02-19 MED ORDER — MIDAZOLAM HCL 2 MG/2ML IJ SOLN
INTRAMUSCULAR | Status: AC
  Filled 2024-02-19: qty 2

## 2024-02-19 SURGICAL SUPPLY — 37 items
BLADE BOVIE TIP EXT 4 (BLADE) IMPLANT
BLADE SURG 15 STRL LF DISP TIS (BLADE) ×2 IMPLANT
CHLORAPREP W/TINT 26 (MISCELLANEOUS) IMPLANT
COVER PROBE GAMMA FINDER SLV (MISCELLANEOUS) ×2 IMPLANT
DERMABOND ADVANCED .7 DNX12 (GAUZE/BANDAGES/DRESSINGS) ×2 IMPLANT
DEVICE DUBIN SPECIMEN MAMMOGRA (MISCELLANEOUS) ×2 IMPLANT
DRAPE C-ARM XRAY 36X54 (DRAPES) IMPLANT
DRAPE C-ARMOR (DRAPES) IMPLANT
DRAPE LAPAROTOMY 77X122 PED (DRAPES) ×2 IMPLANT
ELECTRODE REM PT RTRN 9FT ADLT (ELECTROSURGICAL) ×2 IMPLANT
GAUZE 4X4 16PLY ~~LOC~~+RFID DBL (SPONGE) IMPLANT
GLOVE BIO SURGEON STRL SZ 6.5 (GLOVE) ×2 IMPLANT
GLOVE BIOGEL PI IND STRL 6.5 (GLOVE) ×2 IMPLANT
GLOVE SURG SYN 6.5 ES PF (GLOVE) ×6 IMPLANT
GLOVE SURG SYN 6.5 PF PI (GLOVE) ×4 IMPLANT
GOWN STRL REUS W/ TWL LRG LVL3 (GOWN DISPOSABLE) ×6 IMPLANT
KIT MARKER MARGIN INK (KITS) IMPLANT
KIT TURNOVER KIT A (KITS) ×2 IMPLANT
LABEL OR SOLS (LABEL) ×2 IMPLANT
MANIFOLD NEPTUNE II (INSTRUMENTS) ×2 IMPLANT
MARKER MARGIN CORRECT CLIP (MARKER) IMPLANT
NDL HYPO 22X1.5 SAFETY MO (MISCELLANEOUS) IMPLANT
NDL HYPO 25X1 1.5 SAFETY (NEEDLE) ×2 IMPLANT
NDL SAFETY ECLIPSE 18X1.5 (NEEDLE) IMPLANT
NEEDLE HYPO 22X1.5 SAFETY MO (MISCELLANEOUS) IMPLANT
NEEDLE HYPO 25X1 1.5 SAFETY (NEEDLE) ×2 IMPLANT
PACK BASIN MINOR ARMC (MISCELLANEOUS) ×2 IMPLANT
POWDER SURGICEL 3.0 GRAM (HEMOSTASIS) IMPLANT
RETRACTOR RING XSMALL (MISCELLANEOUS) IMPLANT
SUT SILK 2 0 SH (SUTURE) IMPLANT
SUT VIC AB 3-0 SH 27X BRD (SUTURE) ×2 IMPLANT
SUT VIC AB 5-0 PS2 18 (SUTURE) IMPLANT
SUTURE MNCRL 4-0 27XMF (SUTURE) ×2 IMPLANT
SYR 10ML LL (SYRINGE) ×2 IMPLANT
TRAP FLUID SMOKE EVACUATOR (MISCELLANEOUS) ×2 IMPLANT
WATER STERILE IRR 1000ML POUR (IV SOLUTION) ×2 IMPLANT
WATER STERILE IRR 500ML POUR (IV SOLUTION) ×2 IMPLANT

## 2024-02-19 NOTE — Interval H&P Note (Signed)
 History and Physical Interval Note:  02/19/2024 9:30 AM  Rebecca Patton  has presented today for surgery, with the diagnosis of C50.41 Malignant neoplasm of upper-outer quadrant, Rt breast in female    Z17.0 Estrogen receptor positive.  The various methods of treatment have been discussed with the patient and family. After consideration of risks, benefits and other options for treatment, the patient has consented to  Procedure(s) with comments: BREAST LUMPECTOMY (Right) - with needle localization BIOPSY, LYMPH NODE, SENTINEL, AXILLARY (Right) as a surgical intervention.  The patient's history has been reviewed, patient examined, no change in status, stable for surgery.  I have reviewed the patient's chart and labs.  Questions were answered to the patient's satisfaction.     Eldred Grego

## 2024-02-19 NOTE — Op Note (Signed)
 Preoperative diagnosis: Right breast multicentric carcinoma.  Postoperative diagnosis: Same.   Procedure: Right needle-localized partial mastectomy.                      Right Axillary Sentinel Lymph node biopsy  Anesthesia: GETA  Surgeon: Dr. Dortha Gauss  Wound Classification: Clean  Indications: Patient is a 78 y.o. female with a nonpalpable right breast multicentric masses noted on mammography with core biopsy demonstrating invasive mammary carcinoma requires needle-localized partial mastectomy for treatment with sentinel lymph node biopsy.   Findings: 1. Specimen mammography shows marker and 2 wires on specimen 2. Pathology call refers gross examination of margins was grossly clear 3. No other palpable mass or lymph node identified.  4.  The third wire was found within the pectoralis muscle  Description of procedure: Preoperative needle localization was performed by radiology. In the nuclear medicine suite, the subareolar region was injected with Tc-99 sulfur colloid. Localization studies were reviewed. The patient was taken to the operating room and placed supine on the operating table, and after general anesthesia the right chest and axilla were prepped and draped in the usual sterile fashion. A time-out was completed verifying correct patient, procedure, site, positioning, and implant(s) and/or special equipment prior to beginning this procedure.  By comparing the localization studies, the probable trajectory and location of the mass was visualized. A circumareolar skin incision was planned in such a way as to minimize the amount of dissection to reach the mass.  The skin incision was made. Flaps were raised and the location of the tag was confirmed with [facility] device confirmed. A 2-0 silk figure-of-eight stay suture was placed and used for retraction. Dissection was then taken down circumferentially, taking care to include the entire localizing tag and a wide margin of grossly  normal tissue. The specimen and entire localizing tag were removed. The specimen was oriented and sent to radiology with the localization studies.  2 wires were identified but a third wire was not identified on the specimen mammogram.  Confirmation was received by pathology that the entire target lesion had been resected.  I spent more time looking for the third wire than the actual surgery.  With the use of C arm I was able to localize a probable location of the wire.  Using live fluoroscopy, I was able to move the tissue until I finally identified the needle localization wire.  The needle localization treatment wire was completely excised. The wound was irrigated. Hemostasis was checked. The wound was closed with interrupted sutures of 3-0 Vicryl and a subcuticular suture of Monocryl 3-0. No attempt was made to close the dead space.  A hand-held gamma probe was used to identify the location of the hottest spot in the axilla. An incision was made around the caudal axillary hairline. Dissection was carried down until subdermal facias was advanced. The probe was placed and again, the point of maximal count was found. Dissection continue until nodule was identified. The probe was placed in contact with the node. The node was excised in its entirety.  Four additional hot spot was detected and the node was excised in similar fashion. No additional hot spots were identified. No clinically abnormal nodes were palpated. The procedure was terminated. Hemostasis was achieved and the wound closed in layers with deep interrupted 3-0 Vicryl and skin was closed with subcuticular suture of Monocryl 3-0.  The patient tolerated the procedure well and was taken to the postanesthesia care unit in stable condition.  Sentinel Node Biopsy Synoptic Operative Report  Operation performed with curative intent:Yes  Tracer(s) used to identify sentinel nodes in the upfront surgery (non-neoadjuvant) setting (select all that  apply):Radioactive Tracer  Tracer(s) used to identify sentinel nodes in the neoadjuvant setting (select all that apply):N/A  All nodes (colored or non-colored) present at the end of a dye-filled lymphatic channel were removed:N/A  All significantly radioactive nodes were removed:Yes  All palpable suspicious nodes were removed:N/A  Biopsy-proven positive nodes marked with clips prior to chemotherapy were identified and removed:N/A  Specimen: Right Breast mass                     Sentinel Lymph nodes #1, #2, #3, #4, #5  Complications: None  Estimated Blood Loss: 20 mL

## 2024-02-19 NOTE — OR Nursing (Signed)
 Upon removal of breast specimen, only 2 of 3 wires were noted on faxitron specimen image. Mammography and radiologist aware. After removal of axillary sentinel nodes, fluoroscopy guidance was used locate and extract third wire. X-ray image of wire was taken and wire sent to pathology.

## 2024-02-19 NOTE — Anesthesia Procedure Notes (Signed)
 Procedure Name: LMA Insertion Date/Time: 02/19/2024 9:57 AM  Performed by: Stanford Earl, CRNAPre-anesthesia Checklist: Patient identified, Emergency Drugs available, Suction available and Patient being monitored Patient Re-evaluated:Patient Re-evaluated prior to induction Oxygen Delivery Method: Circle system utilized Preoxygenation: Pre-oxygenation with 100% oxygen Induction Type: IV induction Ventilation: Mask ventilation without difficulty LMA Size: 4.0 Number of attempts: 1 Placement Confirmation: positive ETCO2, CO2 detector and breath sounds checked- equal and bilateral

## 2024-02-19 NOTE — Anesthesia Postprocedure Evaluation (Signed)
 Anesthesia Post Note  Patient: Rebecca Patton  Procedure(s) Performed: BREAST LUMPECTOMY (Right) BIOPSY, LYMPH NODE, SENTINEL, AXILLARY (Right: Breast)  Patient location during evaluation: PACU Anesthesia Type: General Level of consciousness: awake and alert Pain management: pain level controlled Vital Signs Assessment: post-procedure vital signs reviewed and stable Respiratory status: spontaneous breathing, nonlabored ventilation, respiratory function stable and patient connected to nasal cannula oxygen Cardiovascular status: blood pressure returned to baseline and stable Postop Assessment: no apparent nausea or vomiting Anesthetic complications: no   No notable events documented.   Last Vitals:  Vitals:   02/19/24 1340 02/19/24 1345  BP:  139/61  Pulse: 75 74  Resp: 19 15  Temp:    SpO2: 98% 94%    Last Pain:  Vitals:   02/19/24 1400  TempSrc:   PainSc: 0-No pain                 Nancey Awkward

## 2024-02-19 NOTE — Transfer of Care (Signed)
 Immediate Anesthesia Transfer of Care Note  Patient: Rebecca Patton  Procedure(s) Performed: BREAST LUMPECTOMY (Right) BIOPSY, LYMPH NODE, SENTINEL, AXILLARY (Right: Breast)  Patient Location: PACU  Anesthesia Type:General  Level of Consciousness: sedated  Airway & Oxygen Therapy: Patient Spontanous Breathing and Patient connected to nasal cannula oxygen  Post-op Assessment: Report given to RN and Post -op Vital signs reviewed and stable  Post vital signs: Reviewed and stable  Last Vitals:  Vitals Value Taken Time  BP 137/59 02/19/24 1332  Temp 36 C 02/19/24 1332  Pulse 73 02/19/24 1336  Resp 14 02/19/24 1336  SpO2 97 % 02/19/24 1336  Vitals shown include unfiled device data.  Last Pain:  Vitals:   02/19/24 0903  TempSrc: Temporal  PainSc: 0-No pain         Complications: No notable events documented.

## 2024-02-19 NOTE — Discharge Instructions (Signed)

## 2024-02-19 NOTE — Anesthesia Preprocedure Evaluation (Signed)
 Anesthesia Evaluation  Patient identified by MRN, date of birth, ID band Patient awake    Reviewed: Allergy & Precautions, NPO status , Patient's Chart, lab work & pertinent test results  History of Anesthesia Complications Negative for: history of anesthetic complications  Airway Mallampati: III  TM Distance: >3 FB Neck ROM: full    Dental no notable dental hx.    Pulmonary neg pulmonary ROS, former smoker   Pulmonary exam normal        Cardiovascular hypertension, On Medications negative cardio ROS Normal cardiovascular exam     Neuro/Psych negative neurological ROS  negative psych ROS   GI/Hepatic Neg liver ROS,GERD  Medicated,,  Endo/Other  Hypothyroidism    Renal/GU      Musculoskeletal  (+) Arthritis ,    Abdominal   Peds  Hematology negative hematology ROS (+)   Anesthesia Other Findings Past Medical History: No date: Anxiety No date: Arthritis 09/2020: COVID-19 No date: Depression No date: Dysrhythmia     Comment:  ST No date: GERD (gastroesophageal reflux disease) No date: High blood pressure No date: Hyperlipidemia No date: Hypothyroidism No date: Malignant neoplasm of upper-outer quadrant of right breast,  estrogen receptor positive (HCC) 2006: MRSA infection     Comment:  on nose 10/13/2023: Pneumonia No date: Pre-diabetes No date: Supraumbilical hernia No date: Symptomatic cholelithiasis 2009: Uterine cancer (HCC)  Past Surgical History: 09/13/2007: ABDOMINAL HYSTERECTOMY     Comment:  BSO 01/24/2024: BREAST BIOPSY; Right     Comment:  US  RT BREAST BX W LOC DEV EA ADD LESION IMG BX SPEC US                GUIDE 01/24/2024 [facility] 01/24/2024: BREAST BIOPSY; Right     Comment:  US  RT BREAST BX W LOC DEV 1ST LESION IMG BX SPEC US                GUIDE 01/24/2024 [facility] 01/24/2024: BREAST BIOPSY; Right     Comment:  US  RT BREAST BX W LOC DEV EA ADD LESION IMG BX SPEC US                 GUIDE 01/24/2024 [facility] No date: BUNIONECTOMY No date: CHOLECYSTECTOMY 01/23/2020: COLONOSCOPY WITH PROPOFOL ; N/A     Comment:  Procedure: COLONOSCOPY WITH PROPOFOL ;  Surgeon: [place],               Alphonsus Jeans, MD;  Location: [facility];  Service:               Gastroenterology;  Laterality: N/A; No date: EXCISION NEUROMA 05/18/2018: INSERTION OF MESH; N/A     Comment:  Procedure: INSERTION OF MESH;  Surgeon: Franki Isles, MD;  Location: [facility];  Service: General;                Laterality: N/A; No date: NASAL SINUS SURGERY No date: TUBAL LIGATION 05/18/2018: UMBILICAL HERNIA REPAIR; N/A     Comment:  Procedure: HERNIA REPAIR SUPRA-UMBILICAL ADULT;                Surgeon: Franki Isles, MD;  Location: [facility];                Service: General;  Laterality: N/A;  BMI    Body Mass Index: 32.28 kg/m      Reproductive/Obstetrics negative OB ROS  Anesthesia Physical Anesthesia Plan  ASA: 3  Anesthesia Plan: General LMA   Post-op Pain Management: Ofirmev  IV (intra-op)* and Toradol  IV (intra-op)*   Induction: Intravenous  PONV Risk Score and Plan: 3 and Dexamethasone , Ondansetron  and Treatment may vary due to age or medical condition  Airway Management Planned: LMA  Additional Equipment:   Intra-op Plan:   Post-operative Plan: Extubation in OR  Informed Consent: I have reviewed the patients History and Physical, chart, labs and discussed the procedure including the risks, benefits and alternatives for the proposed anesthesia with the patient or authorized representative who has indicated his/her understanding and acceptance.     Dental Advisory Given  Plan Discussed with: Anesthesiologist, CRNA and Surgeon  Anesthesia Plan Comments: (Patient consented for risks of anesthesia including but not limited to:  - adverse reactions to medications - damage to eyes, teeth,  lips or other oral mucosa - nerve damage due to positioning  - sore throat or hoarseness - Damage to heart, brain, nerves, lungs, other parts of body or loss of life  Patient voiced understanding and assent.)        Anesthesia Quick Evaluation

## 2024-02-20 ENCOUNTER — Encounter: Payer: Self-pay | Admitting: General Surgery

## 2024-02-22 ENCOUNTER — Encounter: Payer: Self-pay | Admitting: *Deleted

## 2024-02-22 LAB — SURGICAL PATHOLOGY

## 2024-02-22 NOTE — Progress Notes (Signed)
 Pathology results are back from lumpectomy.  Dr. Randy Buttery would like to see Ms. Iannaccone on Monday 6/16.  Appt. Details given to her.

## 2024-02-26 ENCOUNTER — Inpatient Hospital Stay: Admitting: Oncology

## 2024-02-26 ENCOUNTER — Encounter: Payer: Self-pay | Admitting: Oncology

## 2024-02-26 ENCOUNTER — Other Ambulatory Visit: Payer: Self-pay | Admitting: General Surgery

## 2024-02-26 ENCOUNTER — Other Ambulatory Visit: Payer: Self-pay | Admitting: Oncology

## 2024-02-26 ENCOUNTER — Encounter: Payer: Self-pay | Admitting: *Deleted

## 2024-02-26 VITALS — BP 148/52 | HR 59 | Temp 96.3°F | Resp 19 | Ht 66.0 in | Wt 207.6 lb

## 2024-02-26 DIAGNOSIS — Z170 Estrogen receptor positive status [ER+]: Secondary | ICD-10-CM

## 2024-02-26 DIAGNOSIS — C50919 Malignant neoplasm of unspecified site of unspecified female breast: Secondary | ICD-10-CM | POA: Diagnosis not present

## 2024-02-26 DIAGNOSIS — C50411 Malignant neoplasm of upper-outer quadrant of right female breast: Secondary | ICD-10-CM | POA: Diagnosis not present

## 2024-02-26 DIAGNOSIS — E785 Hyperlipidemia, unspecified: Secondary | ICD-10-CM | POA: Diagnosis not present

## 2024-02-26 DIAGNOSIS — Z7189 Other specified counseling: Secondary | ICD-10-CM | POA: Diagnosis not present

## 2024-02-26 DIAGNOSIS — E039 Hypothyroidism, unspecified: Secondary | ICD-10-CM | POA: Diagnosis not present

## 2024-02-26 MED ORDER — LETROZOLE 2.5 MG PO TABS: 2.5000 mg | ORAL_TABLET | Freq: Every day | ORAL | 3 refills | Status: AC

## 2024-02-26 NOTE — Progress Notes (Signed)
 Met with patient and daughter at med onc appt. To discuss final pathology.   Rx for letrozole sent to [facility].  Oncotype Dx order [id number] placed online.

## 2024-02-28 ENCOUNTER — Ambulatory Visit
Admission: RE | Admit: 2024-02-28 | Discharge: 2024-02-28 | Disposition: A | Discharge status: Home / Self Care | Source: Ambulatory Visit | Attending: General Surgery | Admitting: General Surgery

## 2024-02-28 DIAGNOSIS — Z170 Estrogen receptor positive status [ER+]: Secondary | ICD-10-CM | POA: Diagnosis not present

## 2024-02-28 DIAGNOSIS — C50411 Malignant neoplasm of upper-outer quadrant of right female breast: Secondary | ICD-10-CM | POA: Insufficient documentation

## 2024-02-28 DIAGNOSIS — R92323 Mammographic fibroglandular density, bilateral breasts: Secondary | ICD-10-CM | POA: Diagnosis not present

## 2024-02-28 DIAGNOSIS — N6489 Other specified disorders of breast: Secondary | ICD-10-CM | POA: Diagnosis not present

## 2024-02-28 MED ORDER — GADOBUTROL 1 MMOL/ML IV SOLN
9.0000 mL | Freq: Once | INTRAVENOUS | Status: AC | PRN
  Administered 2024-02-28: 9 mL via INTRAVENOUS

## 2024-02-28 NOTE — Progress Notes (Signed)
 History of Present Illness Patient comes today for postop evaluation after right breast partial mastectomy with sentinel lymph node biopsy.  Surgical pathology showed a 5 by 4 x 6 x 2.5 cm invasive lobular carcinoma.  4 out of 5 lymph node were positive for metastatic lobular carcinoma.  Patient denies any issues with the healing.  She does feel sore in the surgical area.  She denies any drainage.  She denies any fever.      PAST MEDICAL HISTORY:  Past Medical History:  Diagnosis Date  . Adult hypothyroidism 03/20/2015  . Anxiety 03/20/2015  . Arthritis 03/20/2015  . 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 Clinical depression 03/20/2015  . Essential (primary) hypertension 03/20/2015  . HLD (hyperlipidemia) 03/20/2015  . Malignant neoplasm of corpus uteri, except isthmus (CMS/HHS-HCC) 03/20/2015  . Osteopenia 03/20/2015  . Supraumbilical hernia 05/15/2018        PAST SURGICAL HISTORY:   Past Surgical History:  Procedure Laterality Date  . COLONOSCOPY  01/23/2020   Hyperplastic colon polyp/No Repeat due to age/TKT  . MASTECTOMY PARTIAL Right 02/19/2024   Dr Lucas Catchings -- w/ RF & SN  . bunionectomy    . HERNIA REPAIR    . HYSTERECTOMY    . TUBAL LIGATION           MEDICATIONS:  Outpatient Encounter Medications as of 02/27/2024  Medication Sig Dispense Refill  . aspirin 81 MG EC tablet Take 81 mg by mouth once daily    . aspirin-calcium carbonate 81 mg-300 mg calcium(777 mg) Tab Take by mouth    . betamethasone dipropionate, augmented, (DIPROLENE-AF) 0.05 % cream     . celecoxib (CELEBREX) 100 MG capsule TAKE 1 CAPSULE TWICE DAILY 180 capsule 1  . coQ10, ubiquinol, (QUNOL MEGA COQ10) 100 mg Cap Take by mouth    . cyanocobalamin (VITAMIN B12) 100 MCG tablet Take by mouth    . fluticasone  propionate (FLONASE ) 50 mcg/actuation nasal spray USE 2 SPRAYS IN EACH       NOSTRIL TWICE DAILY AS     NEEDED FOR RHINITIS FOR UP TO 90 DAYS    . garlic 1,000 mg Cap Take by mouth    . hydroCHLOROthiazide   (HYDRODIURIL ) 25 MG tablet Take 1 tablet (25 mg total) by mouth once daily 10 tablet 0  . HYDROcodone -acetaminophen  (NORCO) 5-325 mg tablet every 6 (six) hours as needed    . ibuprofen  (MOTRIN ) 600 MG tablet Take by mouth    . Lactobac no.41/Bifidobact no.7 (PROBIOTIC-10 ORAL) Take 1 capsule by mouth once daily    . letrozole (FEMARA) 2.5 mg tablet Take 2.5 mg by mouth once daily    . levocetirizine (XYZAL ) 5 MG tablet Take 5 mg by mouth every evening    . levothyroxine  (SYNTHROID ) 50 MCG tablet Take 1 tablet (50 mcg total) by mouth once daily Take on an empty stomach with a glass of water  at least 30-60 minutes before breakfast. 30 tablet 3  . levothyroxine  (SYNTHROID ) 75 MCG tablet Take 1 tablet (75 mcg total) by mouth once daily Take on an empty stomach with a glass of water  at least 30-60 minutes before breakfast. 30 tablet 11  . losartan  (COZAAR ) 100 MG tablet TAKE 1 TABLET ONCE DAILY 90 tablet 3  . melatonin 3 mg tablet Take by mouth    . metoprolol  SUCCinate (TOPROL -XL) 25 MG XL tablet TAKE 1 TABLET BY MOUTH EVERY DAY 90 tablet 1  . multivitamin tablet Take 1 tablet by mouth once daily    .  NON FORMULARY Fruits and Veggies Supplements    . omega-3 fatty acids-fish oil 300-1,000 mg capsule Take by mouth    . omeprazole  (PRILOSEC) 20 MG DR capsule Take 1 capsule (20 mg total) by mouth once daily 90 capsule 3  . predniSONE  (DELTASONE ) 20 MG tablet Take 1 tablet (20 mg total) by mouth once daily 7 tablet 0  . RED YEAST RICE ORAL Take 700 mg by mouth once daily    . baclofen  (LIORESAL ) 10 MG tablet TAKE 1 TABLET BY MOUTH THREE TIMES DAILY (Patient not taking: Reported on 02/27/2024) 15 tablet 0  . gabapentin  (NEURONTIN ) 100 MG capsule Take 100 mg by mouth 2 (two) times daily (Patient not taking: Reported on 12/22/2022)    . HYDROcodone -homatropine (HYCODAN) 5-1.5 mg/5 mL syrup Take 5 mLs by mouth every 6 (six) hours as needed for Cough (Patient not taking: Reported on 02/27/2024) 115 mL 0  .  quinapriL  (ACCUPRIL ) 40 MG tablet  (Patient not taking: Reported on 12/22/2022)    . sertraline  (ZOLOFT ) 50 MG tablet  (Patient not taking: Reported on 12/22/2022)     No facility-administered encounter medications on file as of 02/27/2024.     ALLERGIES:   Patient has no known allergies.   SOCIAL HISTORY:  Social History   Socioeconomic History  . Marital status: Widowed  Tobacco Use  . Smoking status: Former  . Smokeless tobacco: Never  Substance and Sexual Activity  . Alcohol use: Defer  . Drug use: Defer  . Sexual activity: Defer   Social Drivers of Health   Financial Resource Strain: Low Risk  (02/06/2024)   Overall Financial Resource Strain (CARDIA)   . Difficulty of Paying Living Expenses: Not hard at all  Food Insecurity: No Food Insecurity (02/06/2024)   Hunger Vital Sign   . Worried About Programme researcher, broadcasting/film/video in the Last Year: Never true   . Ran Out of Food in the Last Year: Never true  Transportation Needs: No Transportation Needs (02/06/2024)   PRAPARE - Transportation   . Lack of Transportation (Medical): No   . Lack of Transportation (Non-Medical): No    PHYSICAL EXAM:  Vitals:   02/27/24 1433  BP: (!) 150/68  Pulse: 64  .  Ht:167.6 cm (5' 5.98) Wt:92.1 kg (203 lb 0.7 oz) ADJ:Anib surface area is 2.07 meters squared. Body mass index is 32.79 kg/m.SABRA   GENERAL: Alert, active, oriented x3  BREAST: Moderate sized seroma of the right breast.  No cellulitis, no drainage.   Results SURGICAL PATHOLOGY  [facility]  [address], [address]  [place], [place] [address]  Telephone [phone] or [phone] Fax [phone]   REPORT OF SURGICAL PATHOLOGY    Accession #: [id number]  Patient Name: Rebecca Patton, Rebecca Patton  Visit # : [id number]   MRN: [id number]  Physician: Rodolph Romano  DOB/Age [birth year] (Age: 78) Gender: F  Collected Date: 02/19/2024  Received Date: 02/20/2024   FINAL DIAGNOSIS        1. Breast, lumpectomy, right  breast mass :       INVASIVE LOBULAR CARCINOMA, GRADE 2 (3+2+1)       LOBULAR CARCINOMA IN SITU AND ALH SHOWING DUCTAL INVOLVEMENT       TUMOR MEASURES 5.0 X 4.6 X 2.5 CM       INVASIVE TUMOR PRESENT AT SUPERIOR MARGIN (2 MM FROM INFERIOR MARGIN)       NEGATIVE FOR ANGIOLYMPHATIC INVASION       PROGNOSTIC MARKERS (FROM REPORT  [id number]): ESTROGEN RECEPTOR POSITIVE,       PROGESTERONE RECEPTOR POSITIVE, KI-67 15% AND HER2/ONCOPROTEIN EXPRESSION       NEGATIVE (1+)       CHANGES CONSISTENT WITH BIOPSY       FIBROCYSTIC CHANGES INCLUDING STROMAL FIBROSIS, CYSTIC DILATATION OF DUCTS,       APOCRINE METAPLASIA AND USUAL DUCT HYPERPLASIA       FOCAL FIBROMATOID CHANGE       CYSTIC FAT NECROSIS WITH FIBROSIS AND FEATURES SUGGESTIVE OF PRIOR PROCEDURE       MICROCALCIFICATIONS PRESENT WITHIN FIBROCYSTIC CHANGES        2. Lymph node, sentinel, biopsy, axillary node #1 :       ONE BENIGN LYMPH NODE, NEGATIVE FOR CARCINOMA (0/1)        3. Foreign body, removal, wire :       BENIGN ADIPOSE AND FIBROVASCULAR STROMA       LOCALIZING WIRE (GROSS ONLY)       NEGATIVE FOR CARCINOMA        4. Lymph node, sentinel, biopsy, axillary node #2 :       METASTATIC LOBULAR CARCINOMA (1/1)       NEGATIVE FOR EXTRACAPSULAR EXTENSION        5. Lymph node, sentinel, biopsy, axillary node #3 :       METASTATIC LOBULAR CARCINOMA (1/1)       NEGATIVE FOR EXTRACAPSULAR EXTENSION        6. Lymph node, sentinel, biopsy, axillary node #4 :       METASTATIC LOBULAR CARCINOMA (1/1)       NEGATIVE FOR EXTRACAPSULAR EXTENSION        7. Lymph node, sentinel, biopsy, axillary node #5 :       METASTATIC LOBULAR CARCINOMA (1/1)       NEGATIVE FOR EXTRACAPSULAR EXTENSION     Assessment & Plan Invasive lobular carcinoma of the right breast Pathologic stage classification pT2, pN2a, MX ER positive/PR positive/HER2/neu negative -S/p partial mastectomy - IntraOp gross evaluation of margins shows that they were grossly  negative closest margin inferior 0.5 cm.  Final pathology shows positive anterior margin - S/p sentinel lymph node biopsy.  4 out of 5 sentinel needle biopsy positive for metastatic lobular carcinoma  Final pathology with new findings such as invasive mammary carcinoma with lobular features.  Patient will benefit of bilateral breast MRI.  The anterior margin is positive.  Patient will need at least reexcision of the anterior margin.  Having 4 out of 5 sentinel lymph node positive for metastatic liver carcinoma, patient will benefit of complete axillary node dissection.  As per discussion with medical oncology further testing is being done to decide if patient would benefit of adjuvant chemotherapy.  Patient may also benefit of adjuvant radiation therapy.  Patient will benefit of adjuvant endocrine therapy.  Patient ready started adjuvant endocrine therapy.  Patient will also need a PET scan to rule out more advanced disease and final staging before surgical intervention.  If patient is found with stage IV disease, no surgical intervention will be beneficial.  All the discussion was done with patient and her 2 daughters.  They understood the plan and agreed with current plan.    Malignant neoplasm of upper-outer quadrant of right breast in female, estrogen receptor positive (CMS/HHS-HCC) [C50.411, Z17.0]          Patient and her daughters verbalized understanding, all questions were answered, and were agreeable with the plan outlined above.   Lucas  Rodolph, MD  Electronically signed by Lucas Rodolph, MD

## 2024-02-29 ENCOUNTER — Telehealth: Payer: Self-pay

## 2024-02-29 NOTE — Telephone Encounter (Signed)
 Received via in basket message indicating Pharmacy requested follow up on 02/29/2024.  Outbound call, spoke to [place] who indicated patient has already come to pick up medication and to disregard.

## 2024-02-29 NOTE — Telephone Encounter (Signed)
 Spoke w/ pharmacy; patient has already picked up medication.

## 2024-02-29 NOTE — Telephone Encounter (Deleted)
 Received via in basket message indicating Pharmacy requested follow up on 02/29/2024.  Outbound call, spoke to [place] who indicated patient has already come to pick up medication and to disregard.

## 2024-03-02 NOTE — Progress Notes (Signed)
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 disease with lymph node involvement as well as bilateral breast MRI for lobular histology noted on her final pathology to rule out  any more disease and what needs to be on mammogram  While we are awaiting the results of MRI and PET scan and given that we do not have a date for her reexcision surgery yet-patient would like to move forward with endocrine therapyGiven that her tumor was ER PR positive hormone therapy is indicated at this time.  Idiscussed the role for hormone therapy. Given that she is postmenopausal I would favor 5 years of adjuvant hormone therapy with aromatase inhibitor. I discussed the risks and benefits of letrozole including all but not limited to fatigue, hypercholesterolemia, hot flashes, arthralgias and worsening bone health.  Patient will also need to be on calcium 1200 mg along with vitamin D 800 international units.  We will obtain a baseline bone density scan. Written information about letrozole given to the patient. Patient  verbalized understanding and agrees to proceed   I will see her back in 2 weeks time to discuss further management   Cancer Staging  Malignant neoplasm of upper-outer quadrant of right breast in female, estrogen receptor positive (HCC) Staging form: Breast, AJCC 8th Edition - Clinical stage from 02/01/2024: Stage IB (cT2, cN0, cM0, G2, ER+, PR+, HER2-) - Signed by [name], MD on 02/01/2024 Method of lymph node assessment: Clinical Nuclear grade: G2 Histologic grading system: 3 grade system - Pathologic stage from 02/26/2024: Stage IB (pT2, pN2a, cM0, G2, ER+, PR+, HER2-) - Signed by [name], MD on 03/02/2024 Histologic grading system: 3 grade system     Visit Diagnosis 1. Invasive carcinoma of breast (HCC)   2. Goals of care, counseling/discussion      Dr. Annah Melanee, MD, MPH [facility] [phone] 03/02/2024 6:13 PM

## 2024-03-04 ENCOUNTER — Ambulatory Visit
Admission: RE | Admit: 2024-03-04 | Discharge: 2024-03-04 | Disposition: A | Discharge status: Home / Self Care | Source: Ambulatory Visit | Attending: Oncology | Admitting: Oncology

## 2024-03-04 ENCOUNTER — Ambulatory Visit: Payer: Self-pay | Admitting: General Surgery

## 2024-03-04 DIAGNOSIS — N200 Calculus of kidney: Secondary | ICD-10-CM | POA: Diagnosis not present

## 2024-03-04 DIAGNOSIS — C50919 Malignant neoplasm of unspecified site of unspecified female breast: Secondary | ICD-10-CM | POA: Insufficient documentation

## 2024-03-04 DIAGNOSIS — I700 Atherosclerosis of aorta: Secondary | ICD-10-CM | POA: Diagnosis not present

## 2024-03-04 DIAGNOSIS — I2510 Atherosclerotic heart disease of native coronary artery without angina pectoris: Secondary | ICD-10-CM | POA: Insufficient documentation

## 2024-03-04 LAB — GLUCOSE, CAPILLARY: Glucose-Capillary: 106 mg/dL — ABNORMAL HIGH (ref 70–99)

## 2024-03-04 MED ORDER — FLUDEOXYGLUCOSE F - 18 (FDG) INJECTION
10.7000 | Freq: Once | INTRAVENOUS | Status: AC | PRN
Start: 2024-03-04 — End: 2024-03-04
  Administered 2024-03-04: 11.26 via INTRAVENOUS

## 2024-03-05 ENCOUNTER — Encounter
Admission: RE | Admit: 2024-03-05 | Discharge: 2024-03-05 | Disposition: A | Discharge status: Home / Self Care | Source: Ambulatory Visit | Attending: General Surgery | Admitting: General Surgery

## 2024-03-05 NOTE — Pre-Procedure Instructions (Addendum)
 Called patient to  complete PAT interview but we had a limited time.Therefore, only medication review and patient medical hx were completed. Patient is unable to pick up her instructions. So,and pre-anesthesia instructions was given to patient over the phone. Patient verbalized understanding including aspirin and that she needs to get the recommendation from her PCP or surgeon.Questions answered.

## 2024-03-05 NOTE — Patient Instructions (Addendum)
 Your procedure is scheduled on: 03/13/2024 Report to the Registration Desk on the 1st floor of the [facility]. To find out your arrival time, please call [phone] between 1PM - 3PM on: 03/12/2024  If your arrival time is 6:00 am, do not arrive before that time as the [facility] entrance doors do not open until 6:00 am.  REMEMBER: Instructions that are not followed completely may result in serious medical risk, up to and including death; or upon the discretion of your surgeon and anesthesiologist your surgery may need to be rescheduled.  Do not eat food after midnight the night before surgery.  No gum chewing or hard candies.   One week prior to surgery: Stop Anti-inflammatories (NSAIDS) such as Advil , Aleve, Ibuprofen , Motrin , Naproxen, Naprosyn and Aspirin based products such as Excedrin, Goody's Powder, BC Powder  Stop ANY OVER THE COUNTER supplements until after surgery.  You may however, continue to take Tylenol  if needed for pain up until the day of surgery.   Continue taking all of your other prescription medications up until the day of surgery.  ON THE DAY OF SURGERY ONLY TAKE THESE MEDICATIONS WITH SIPS OF WATER :  celecoxib (CELEBREX)  levothyroxine  (SYNTHROID )  metoprolol  succinate ( omeprazole  (PRILOSEC)      No Alcohol for 24 hours before or after surgery.  No Smoking including e-cigarettes for 24 hours before surgery.  No chewable tobacco products for at least 6 hours before surgery.  No nicotine patches on the day of surgery.  Do not use any recreational drugs for at least a week (preferably 2 weeks) before your surgery.  Please be advised that the combination of cocaine and anesthesia may have negative outcomes, up to and including death. If you test positive for cocaine, your surgery will be cancelled.  On the morning of surgery brush your teeth with toothpaste and water , you may rinse your mouth with mouthwash if you wish. Do not swallow any  toothpaste or mouthwash.  Use CHG Soap or wipes as directed on instruction sheet.  Do not wear jewelry, make-up, hairpins, clips or nail polish.  For welded (permanent) jewelry: bracelets, anklets, waist bands, etc.  Please have this removed prior to surgery.  If it is not removed, there is a chance that hospital personnel will need to cut it off on the day of surgery.  Do not wear lotions, powders, or perfumes.   Do not shave body hair from the neck down 48 hours before surgery.  Contact lenses, hearing aids and dentures may not be worn into surgery.  Do not bring valuables to the hospital. [facility] is not responsible for any missing/lost belongings or valuables.    Notify your doctor if there is any change in your medical condition (cold, fever, infection).  Wear comfortable clothing (specific to your surgery type) to the hospital.  After surgery, you can help prevent lung complications by doing breathing exercises.  Take deep breaths and cough every 1-2 hours. Your doctor may order a device called an Incentive Spirometer to help you take deep breaths.  If you are being admitted to the hospital overnight, leave your suitcase in the car. After surgery it may be brought to your room.  If you are being discharged the day of surgery, you will not be allowed to drive home. You will need a responsible individual to drive you home and stay with you for 24 hours after surgery.    Please call the Pre-admissions Testing Dept. at [phone] if  you have any questions about these instructions.  Surgery Visitation Policy:  Patients having surgery or a procedure may have two visitors.  Children under the age of 49 must have an adult with them who is not the patient.  Inpatient Visitation:    Visiting hours are 7 a.m. to 8 p.m. Up to four visitors are allowed at one time in a patient room. The visitors may rotate out with other people during the day.  One visitor age 28 or older may  stay with the patient overnight and must be in the room by 8 p.m.   Merchandiser, retail to address health-related social needs:  [email or web address]

## 2024-03-06 ENCOUNTER — Encounter: Payer: Self-pay | Admitting: *Deleted

## 2024-03-06 NOTE — Progress Notes (Signed)
 Re-excision is scheduled for 7/2.   Oncotype Dx is pending with estimated result date of 7/4.  Dr. Melanee would like to see Rebecca Patton on 7/7.  Appt. Scheduled and details given to her.   She is not sure she will feel like coming to the appointment after having surgery on 7/2 but she will call that morning if she can't make it.   We will leave as scheduled for now.

## 2024-03-07 ENCOUNTER — Ambulatory Visit: Payer: Self-pay | Admitting: General Surgery

## 2024-03-07 ENCOUNTER — Encounter: Payer: Self-pay | Admitting: Oncology

## 2024-03-07 DIAGNOSIS — C50411 Malignant neoplasm of upper-outer quadrant of right female breast: Secondary | ICD-10-CM | POA: Diagnosis not present

## 2024-03-07 DIAGNOSIS — Z170 Estrogen receptor positive status [ER+]: Secondary | ICD-10-CM | POA: Diagnosis not present

## 2024-03-11 ENCOUNTER — Inpatient Hospital Stay: Admitting: Oncology

## 2024-03-12 ENCOUNTER — Ambulatory Visit: Admitting: Oncology

## 2024-03-12 ENCOUNTER — Ambulatory Visit: Admitting: Radiation Oncology

## 2024-03-12 ENCOUNTER — Inpatient Hospital Stay: Attending: Oncology

## 2024-03-12 NOTE — Progress Notes (Signed)
 [facility] CSW Progress Note  Clinical Child psychotherapist contacted patient by phone to follow-up on emotional support.    Interventions: Provided brief mental health counseling with regard to adjusting to her cancer and upcoming surgery.       Follow Up Plan:  CSW will follow-up with patient by phone     Macario CHRISTELLA Au, LCSW Clinical Social Worker [facility]

## 2024-03-13 ENCOUNTER — Encounter: Payer: Self-pay | Admitting: General Surgery

## 2024-03-13 ENCOUNTER — Other Ambulatory Visit: Payer: Self-pay

## 2024-03-13 ENCOUNTER — Ambulatory Visit: Admitting: Radiation Oncology

## 2024-03-13 ENCOUNTER — Ambulatory Visit: Admitting: Anesthesiology

## 2024-03-13 ENCOUNTER — Ambulatory Visit
Admission: RE | Admit: 2024-03-13 | Discharge: 2024-03-13 | Disposition: A | Discharge status: Home / Self Care | Attending: General Surgery | Admitting: General Surgery

## 2024-03-13 ENCOUNTER — Encounter
Admission: RE | Disposition: A | Discharge status: Home / Self Care | Payer: Self-pay | Source: Home / Self Care | Attending: General Surgery

## 2024-03-13 ENCOUNTER — Ambulatory Visit: Admitting: Oncology

## 2024-03-13 DIAGNOSIS — Z87891 Personal history of nicotine dependence: Secondary | ICD-10-CM | POA: Diagnosis not present

## 2024-03-13 DIAGNOSIS — Z170 Estrogen receptor positive status [ER+]: Secondary | ICD-10-CM | POA: Diagnosis not present

## 2024-03-13 DIAGNOSIS — C779 Secondary and unspecified malignant neoplasm of lymph node, unspecified: Secondary | ICD-10-CM | POA: Insufficient documentation

## 2024-03-13 DIAGNOSIS — K219 Gastro-esophageal reflux disease without esophagitis: Secondary | ICD-10-CM | POA: Diagnosis not present

## 2024-03-13 DIAGNOSIS — C50411 Malignant neoplasm of upper-outer quadrant of right female breast: Secondary | ICD-10-CM | POA: Diagnosis not present

## 2024-03-13 DIAGNOSIS — C773 Secondary and unspecified malignant neoplasm of axilla and upper limb lymph nodes: Secondary | ICD-10-CM | POA: Diagnosis not present

## 2024-03-13 DIAGNOSIS — I10 Essential (primary) hypertension: Secondary | ICD-10-CM | POA: Diagnosis not present

## 2024-03-13 HISTORY — PX: AXILLARY LYMPH NODE DISSECTION: SHX5229

## 2024-03-13 HISTORY — PX: BREAST LUMPECTOMY: SHX2

## 2024-03-13 SURGERY — BREAST LUMPECTOMY
Anesthesia: General | Site: Breast | Laterality: Right

## 2024-03-13 MED ORDER — CEFAZOLIN SODIUM-DEXTROSE 2-4 GM/100ML-% IV SOLN
INTRAVENOUS | Status: AC
  Filled 2024-03-13: qty 100

## 2024-03-13 MED ORDER — HYDROMORPHONE HCL 1 MG/ML IJ SOLN
INTRAMUSCULAR | Status: AC
  Filled 2024-03-13: qty 1

## 2024-03-13 MED ORDER — STERILE WATER FOR IRRIGATION IR SOLN
Status: DC | PRN
Start: 2024-03-13 — End: 2024-03-13
  Administered 2024-03-13: 500 mL

## 2024-03-13 MED ORDER — CHLORHEXIDINE GLUCONATE 0.12 % MT SOLN
15.0000 mL | Freq: Once | OROMUCOSAL | Status: AC
  Administered 2024-03-13: 15 mL via OROMUCOSAL

## 2024-03-13 MED ORDER — OXYCODONE HCL 5 MG PO TABS
5.0000 mg | ORAL_TABLET | Freq: Once | ORAL | Status: AC | PRN
  Administered 2024-03-13: 5 mg via ORAL

## 2024-03-13 MED ORDER — CHLORHEXIDINE GLUCONATE 0.12 % MT SOLN
OROMUCOSAL | Status: AC
Start: 2024-03-13 — End: 2024-03-13
  Filled 2024-03-13: qty 15

## 2024-03-13 MED ORDER — LIDOCAINE HCL (PF) 2 % IJ SOLN
INTRAMUSCULAR | Status: AC
  Filled 2024-03-13: qty 5

## 2024-03-13 MED ORDER — EPHEDRINE 5 MG/ML INJ
INTRAVENOUS | Status: AC
  Filled 2024-03-13: qty 5

## 2024-03-13 MED ORDER — ACETAMINOPHEN 10 MG/ML IV SOLN
INTRAVENOUS | Status: AC
  Filled 2024-03-13: qty 100

## 2024-03-13 MED ORDER — CHLORHEXIDINE GLUCONATE 0.12 % MT SOLN
OROMUCOSAL | Status: AC
  Filled 2024-03-13: qty 15

## 2024-03-13 MED ORDER — CEFAZOLIN SODIUM-DEXTROSE 2-4 GM/100ML-% IV SOLN
2.0000 g | INTRAVENOUS | Status: AC
  Administered 2024-03-13: 2 g via INTRAVENOUS

## 2024-03-13 MED ORDER — FENTANYL CITRATE (PF) 100 MCG/2ML IJ SOLN
INTRAMUSCULAR | Status: AC
  Filled 2024-03-13: qty 2

## 2024-03-13 MED ORDER — ORAL CARE MOUTH RINSE
15.0000 mL | Freq: Once | OROMUCOSAL | Status: AC
Start: 2024-03-13 — End: 2024-03-13

## 2024-03-13 MED ORDER — FENTANYL CITRATE (PF) 100 MCG/2ML IJ SOLN
25.0000 ug | INTRAMUSCULAR | Status: DC | PRN
  Administered 2024-03-13: 50 ug via INTRAVENOUS
  Administered 2024-03-13 (×2): 25 ug via INTRAVENOUS
  Administered 2024-03-13 (×2): 50 ug via INTRAVENOUS

## 2024-03-13 MED ORDER — OXYCODONE HCL 5 MG/5ML PO SOLN: 5.0000 mg | Freq: Once | ORAL | Status: AC | PRN

## 2024-03-13 MED ORDER — PROPOFOL 10 MG/ML IV BOLUS
INTRAVENOUS | Status: AC
  Filled 2024-03-13: qty 20

## 2024-03-13 MED ORDER — BUPIVACAINE-EPINEPHRINE (PF) 0.5% -1:200000 IJ SOLN
INTRAMUSCULAR | Status: DC | PRN
  Administered 2024-03-13: 30 mL

## 2024-03-13 MED ORDER — BUPIVACAINE-EPINEPHRINE (PF) 0.5% -1:200000 IJ SOLN
INTRAMUSCULAR | Status: AC
Start: 2024-03-13 — End: 2024-03-13
  Filled 2024-03-13: qty 30

## 2024-03-13 MED ORDER — CEFAZOLIN SODIUM-DEXTROSE 2-4 GM/100ML-% IV SOLN
INTRAVENOUS | Status: AC
Start: 2024-03-13 — End: 2024-03-13
  Filled 2024-03-13: qty 100

## 2024-03-13 MED ORDER — LACTATED RINGERS IV SOLN: INTRAVENOUS | Status: DC | PRN

## 2024-03-13 MED ORDER — ONDANSETRON HCL 4 MG/2ML IJ SOLN
INTRAMUSCULAR | Status: AC
  Filled 2024-03-13: qty 2

## 2024-03-13 MED ORDER — OXYCODONE HCL 5 MG PO TABS
ORAL_TABLET | ORAL | Status: AC
  Filled 2024-03-13: qty 1

## 2024-03-13 MED ORDER — FENTANYL CITRATE (PF) 100 MCG/2ML IJ SOLN
INTRAMUSCULAR | Status: DC | PRN
  Administered 2024-03-13: 50 ug via INTRAVENOUS
  Administered 2024-03-13 (×2): 25 ug via INTRAVENOUS
  Administered 2024-03-13 (×2): 50 ug via INTRAVENOUS

## 2024-03-13 MED ORDER — HYDROMORPHONE HCL 1 MG/ML IJ SOLN
0.5000 mg | INTRAMUSCULAR | Status: DC | PRN
  Administered 2024-03-13 (×2): 0.5 mg via INTRAVENOUS

## 2024-03-13 MED ORDER — MIDAZOLAM HCL 2 MG/2ML IJ SOLN
INTRAMUSCULAR | Status: DC | PRN
  Administered 2024-03-13: 2 mg via INTRAVENOUS

## 2024-03-13 MED ORDER — LIDOCAINE HCL (CARDIAC) PF 100 MG/5ML IV SOSY
PREFILLED_SYRINGE | INTRAVENOUS | Status: DC | PRN
  Administered 2024-03-13: 50 mg via INTRAVENOUS

## 2024-03-13 MED ORDER — DEXAMETHASONE SODIUM PHOSPHATE 10 MG/ML IJ SOLN
INTRAMUSCULAR | Status: DC | PRN
  Administered 2024-03-13: 10 mg via INTRAVENOUS

## 2024-03-13 MED ORDER — ONDANSETRON HCL 4 MG/2ML IJ SOLN
INTRAMUSCULAR | Status: DC | PRN
  Administered 2024-03-13: 4 mg via INTRAVENOUS

## 2024-03-13 MED ORDER — ACETAMINOPHEN 10 MG/ML IV SOLN
INTRAVENOUS | Status: DC | PRN
  Administered 2024-03-13: 1000 mg via INTRAVENOUS

## 2024-03-13 MED ORDER — PROPOFOL 10 MG/ML IV BOLUS
INTRAVENOUS | Status: DC | PRN
  Administered 2024-03-13: 50 mg via INTRAVENOUS
  Administered 2024-03-13: 150 mg via INTRAVENOUS

## 2024-03-13 MED ORDER — HEMOSTATIC AGENTS (NO CHARGE) OPTIME
TOPICAL | Status: DC | PRN
  Administered 2024-03-13: 1 via TOPICAL

## 2024-03-13 MED ORDER — DEXAMETHASONE SODIUM PHOSPHATE 10 MG/ML IJ SOLN
INTRAMUSCULAR | Status: AC
Start: 2024-03-13 — End: 2024-03-13
  Filled 2024-03-13: qty 1

## 2024-03-13 MED ORDER — MIDAZOLAM HCL 2 MG/2ML IJ SOLN
INTRAMUSCULAR | Status: AC
  Filled 2024-03-13: qty 2

## 2024-03-13 MED ORDER — LACTATED RINGERS IV SOLN: INTRAVENOUS | Status: DC

## 2024-03-13 MED ORDER — EPHEDRINE SULFATE-NACL 50-0.9 MG/10ML-% IV SOSY
PREFILLED_SYRINGE | INTRAVENOUS | Status: DC | PRN
Start: 2024-03-13 — End: 2024-03-13
  Administered 2024-03-13: 10 mg via INTRAVENOUS
  Administered 2024-03-13: 5 mg via INTRAVENOUS
  Administered 2024-03-13: 10 mg via INTRAVENOUS

## 2024-03-13 MED ORDER — HYDROCODONE-ACETAMINOPHEN 5-325 MG PO TABS: 1.0000 | ORAL_TABLET | Freq: Four times a day (QID) | ORAL | 0 refills | Status: AC | PRN

## 2024-03-13 SURGICAL SUPPLY — 39 items
BLADE SURG 15 STRL LF DISP TIS (BLADE) ×1 IMPLANT
CHLORAPREP W/TINT 26 (MISCELLANEOUS) IMPLANT
DERMABOND ADVANCED .7 DNX12 (GAUZE/BANDAGES/DRESSINGS) ×1 IMPLANT
DEVICE DUBIN SPECIMEN MAMMOGRA (MISCELLANEOUS) ×1 IMPLANT
DRAIN CHANNEL JP 15F RND 3/16 (MISCELLANEOUS) IMPLANT
DRAPE LAPAROTOMY 100X77 ABD (DRAPES) ×1 IMPLANT
DRAPE LAPAROTOMY 77X122 PED (DRAPES) ×1 IMPLANT
DRAPE LAPAROTOMY TRNSV 106X77 (MISCELLANEOUS) IMPLANT
ELECTRODE CAUTERY BLDE TIP 2.5 (TIP) ×1 IMPLANT
ELECTRODE REM PT RTRN 9FT ADLT (ELECTROSURGICAL) ×1 IMPLANT
EVACUATOR SILICONE 100CC (DRAIN) IMPLANT
GAUZE 4X4 16PLY ~~LOC~~+RFID DBL (SPONGE) IMPLANT
GAUZE SPONGE 4X4 12PLY STRL (GAUZE/BANDAGES/DRESSINGS) IMPLANT
GLOVE BIO SURGEON STRL SZ 6.5 (GLOVE) ×1 IMPLANT
GLOVE BIOGEL PI IND STRL 6.5 (GLOVE) ×1 IMPLANT
GLOVE SURG SYN 6.5 ES PF (GLOVE) ×2 IMPLANT
GLOVE SURG SYN 6.5 PF PI (GLOVE) ×2 IMPLANT
GOWN STRL REUS W/ TWL LRG LVL3 (GOWN DISPOSABLE) ×4 IMPLANT
KIT MARKER MARGIN INK (KITS) IMPLANT
KIT TURNOVER KIT A (KITS) ×1 IMPLANT
LABEL OR SOLS (LABEL) ×1 IMPLANT
MANIFOLD NEPTUNE II (INSTRUMENTS) ×1 IMPLANT
MARKER MARGIN CORRECT CLIP (MARKER) IMPLANT
NDL HYPO 22X1.5 SAFETY MO (MISCELLANEOUS) ×1 IMPLANT
NEEDLE HYPO 22X1.5 SAFETY MO (MISCELLANEOUS) ×1 IMPLANT
PACK BASIN MINOR ARMC (MISCELLANEOUS) ×1 IMPLANT
POWDER SURGICEL 3.0 GRAM (HEMOSTASIS) IMPLANT
RETRACTOR RING XSMALL (MISCELLANEOUS) IMPLANT
SHEARS HARMONIC 9CM CVD (BLADE) IMPLANT
SUT SILK 2 0 SH (SUTURE) IMPLANT
SUT SILK 3-0 18XBRD TIE 12 (SUTURE) IMPLANT
SUT VIC AB 3-0 SH 27X BRD (SUTURE) ×1 IMPLANT
SUTURE EHLN 3-0 FS-10 30 BLK (SUTURE) IMPLANT
SUTURE MNCRL 4-0 27XMF (SUTURE) ×1 IMPLANT
SYR 10ML LL (SYRINGE) ×1 IMPLANT
TRAP FLUID SMOKE EVACUATOR (MISCELLANEOUS) ×1 IMPLANT
TRAP NEPTUNE SPECIMEN COLLECT (MISCELLANEOUS) ×1 IMPLANT
WATER STERILE IRR 1000ML POUR (IV SOLUTION) ×1 IMPLANT
WATER STERILE IRR 500ML POUR (IV SOLUTION) ×1 IMPLANT

## 2024-03-13 NOTE — H&P (Signed)
 History of Present Illness Patient comes today for postop evaluation after right breast partial mastectomy with sentinel lymph node biopsy. Surgical pathology showed a 5 by 4 x 6 x 2.5 cm invasive lobular carcinoma. 4 out of 5 lymph node were positive for metastatic lobular carcinoma.  Patient denies any issues with the healing. She does feel sore in the surgical area. She denies any drainage. She denies any fever.   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  Anxiety 03/20/2015  Arthritis 03/20/2015  Benign neoplasm of colon 03/20/2015  Clinical depression 03/20/2015  Essential (primary) hypertension 03/20/2015  HLD (hyperlipidemia) 03/20/2015  Malignant neoplasm of corpus uteri, except isthmus (CMS/HHS-HCC) 03/20/2015  Osteopenia 03/20/2015  Supraumbilical hernia 05/15/2018     PAST SURGICAL HISTORY:  Past Surgical History:  Procedure Laterality Date  COLONOSCOPY 01/23/2020  Hyperplastic colon polyp/No Repeat due to age/TKT  MASTECTOMY PARTIAL Right 02/19/2024  Dr Lucas Catchings -- w/ RF & SN  bunionectomy  HERNIA REPAIR  HYSTERECTOMY  TUBAL LIGATION    MEDICATIONS:  Outpatient Encounter Medications as of 02/27/2024  Medication Sig Dispense Refill  aspirin 81 MG EC tablet Take 81 mg by mouth once daily  aspirin-calcium carbonate 81 mg-300 mg calcium(777 mg) Tab Take by mouth  betamethasone dipropionate, augmented, (DIPROLENE-AF) 0.05 % cream  celecoxib (CELEBREX) 100 MG capsule TAKE 1 CAPSULE TWICE DAILY 180 capsule 1  coQ10, ubiquinol, (QUNOL MEGA COQ10) 100 mg Cap Take by mouth  cyanocobalamin (VITAMIN B12) 100 MCG tablet Take by mouth  fluticasone  propionate (FLONASE ) 50 mcg/actuation nasal spray USE 2 SPRAYS IN EACH NOSTRIL TWICE DAILY AS NEEDED FOR RHINITIS FOR UP TO 90 DAYS  garlic 1,000 mg Cap Take by mouth  hydroCHLOROthiazide  (HYDRODIURIL ) 25 MG tablet Take 1 tablet (25 mg total) by mouth once daily 10 tablet 0   HYDROcodone -acetaminophen  (NORCO) 5-325 mg tablet every 6 (six) hours as needed  ibuprofen  (MOTRIN ) 600 MG tablet Take by mouth  Lactobac no.41/Bifidobact no.7 (PROBIOTIC-10 ORAL) Take 1 capsule by mouth once daily  letrozole (FEMARA) 2.5 mg tablet Take 2.5 mg by mouth once daily  levocetirizine (XYZAL ) 5 MG tablet Take 5 mg by mouth every evening  levothyroxine  (SYNTHROID ) 50 MCG tablet Take 1 tablet (50 mcg total) by mouth once daily Take on an empty stomach with a glass of water  at least 30-60 minutes before breakfast. 30 tablet 3  levothyroxine  (SYNTHROID ) 75 MCG tablet Take 1 tablet (75 mcg total) by mouth once daily Take on an empty stomach with a glass of water  at least 30-60 minutes before breakfast. 30 tablet 11  losartan  (COZAAR ) 100 MG tablet TAKE 1 TABLET ONCE DAILY 90 tablet 3  melatonin 3 mg tablet Take by mouth  metoprolol  SUCCinate (TOPROL -XL) 25 MG XL tablet TAKE 1 TABLET BY MOUTH EVERY DAY 90 tablet 1  multivitamin tablet Take 1 tablet by mouth once daily  NON FORMULARY Fruits and Veggies Supplements  omega-3 fatty acids-fish oil 300-1,000 mg capsule Take by mouth  omeprazole  (PRILOSEC) 20 MG DR capsule Take 1 capsule (20 mg total) by mouth once daily 90 capsule 3  predniSONE  (DELTASONE ) 20 MG tablet Take 1 tablet (20 mg total) by mouth once daily 7 tablet 0  RED YEAST RICE ORAL Take 700 mg by mouth once daily  baclofen  (LIORESAL ) 10 MG tablet TAKE 1 TABLET BY MOUTH THREE TIMES DAILY (Patient not taking: Reported on 02/27/2024) 15 tablet 0  gabapentin  (NEURONTIN ) 100 MG capsule Take 100 mg by mouth  2 (two) times daily (Patient not taking: Reported on 12/22/2022)  HYDROcodone -homatropine (HYCODAN) 5-1.5 mg/5 mL syrup Take 5 mLs by mouth every 6 (six) hours as needed for Cough (Patient not taking: Reported on 02/27/2024) 115 mL 0  quinapriL  (ACCUPRIL ) 40 MG tablet (Patient not taking: Reported on 12/22/2022)  sertraline  (ZOLOFT ) 50 MG tablet (Patient not taking: Reported on  12/22/2022)   No facility-administered encounter medications on file as of 02/27/2024.    ALLERGIES:  Patient has no known allergies.  SOCIAL HISTORY:  Social History   Socioeconomic History  Marital status: Widowed  Tobacco Use  Smoking status: Former  Smokeless tobacco: Never  Substance and Sexual Activity  Alcohol use: Defer  Drug use: Defer  Sexual activity: Defer   Social Drivers of Health   Financial Resource Strain: Low Risk (02/06/2024)  Overall Financial Resource Strain (CARDIA)  Difficulty of Paying Living Expenses: Not hard at all  Food Insecurity: No Food Insecurity (02/06/2024)  Hunger Vital Sign  Worried About Running Out of Food in the Last Year: Never true  Ran Out of Food in the Last Year: Never true  Transportation Needs: No Transportation Needs (02/06/2024)  PRAPARE - Risk analyst (Medical): No  Lack of Transportation (Non-Medical): No   PHYSICAL EXAM:  Vitals:  02/27/24 1433  BP: (!) 150/68  Pulse: 64  .  Ht:167.6 cm (5' 5.98) Wt:92.1 kg (203 lb 0.7 oz) ADJ:Anib surface area is 2.07 meters squared. Body mass index is 32.79 kg/m.SABRA  GENERAL: Alert, active, oriented x3  BREAST: Moderate sized seroma of the right breast. No cellulitis, no drainage.  Results SURGICAL PATHOLOGY  [facility]  [address], [address]  [place], [place] [address]  Telephone [phone] or [phone] Fax [phone]   REPORT OF SURGICAL PATHOLOGY   Accession #: [id number]  Patient Name: Rebecca Patton, Rebecca Patton  Visit # : [id number]   MRN: [id number]  Physician: Rodolph Romano  DOB/Age [birth year] (Age: 28) Gender: F  Collected Date: 02/19/2024  Received Date: 02/20/2024   FINAL DIAGNOSIS   1. Breast, lumpectomy, right breast mass :  INVASIVE LOBULAR CARCINOMA, GRADE 2 (3+2+1)  LOBULAR CARCINOMA IN SITU AND ALH SHOWING DUCTAL INVOLVEMENT  TUMOR MEASURES 5.0 X 4.6 X 2.5 CM  INVASIVE TUMOR PRESENT AT SUPERIOR  MARGIN (2 MM FROM INFERIOR MARGIN)  NEGATIVE FOR ANGIOLYMPHATIC INVASION  PROGNOSTIC MARKERS (FROM REPORT [phone]): ESTROGEN RECEPTOR POSITIVE,  PROGESTERONE RECEPTOR POSITIVE, KI-67 15% AND HER2/ONCOPROTEIN EXPRESSION  NEGATIVE (1+)  CHANGES CONSISTENT WITH BIOPSY  FIBROCYSTIC CHANGES INCLUDING STROMAL FIBROSIS, CYSTIC DILATATION OF DUCTS,  APOCRINE METAPLASIA AND USUAL DUCT HYPERPLASIA  FOCAL FIBROMATOID CHANGE  CYSTIC FAT NECROSIS WITH FIBROSIS AND FEATURES SUGGESTIVE OF PRIOR PROCEDURE  MICROCALCIFICATIONS PRESENT WITHIN FIBROCYSTIC CHANGES   2. Lymph node, sentinel, biopsy, axillary node #1 :  ONE BENIGN LYMPH NODE, NEGATIVE FOR CARCINOMA (0/1)   3. Foreign body, removal, wire :  BENIGN ADIPOSE AND FIBROVASCULAR STROMA  LOCALIZING WIRE (GROSS ONLY)  NEGATIVE FOR CARCINOMA   4. Lymph node, sentinel, biopsy, axillary node #2 :  METASTATIC LOBULAR CARCINOMA (1/1)  NEGATIVE FOR EXTRACAPSULAR EXTENSION   5. Lymph node, sentinel, biopsy, axillary node #3 :  METASTATIC LOBULAR CARCINOMA (1/1)  NEGATIVE FOR EXTRACAPSULAR EXTENSION   6. Lymph node, sentinel, biopsy, axillary node #4 :  METASTATIC LOBULAR CARCINOMA (1/1)  NEGATIVE FOR EXTRACAPSULAR EXTENSION   7. Lymph node, sentinel, biopsy, axillary node #5 :  METASTATIC LOBULAR CARCINOMA (1/1)  NEGATIVE FOR EXTRACAPSULAR EXTENSION  Assessment & Plan Invasive lobular carcinoma of the right breast Pathologic stage classification pT2, pN2a, MX ER positive/PR positive/HER2/neu negative -S/p partial mastectomy - IntraOp gross evaluation of margins shows that they were grossly negative closest margin inferior 0.5 cm. Final pathology shows positive anterior margin - S/p sentinel lymph node biopsy. 4 out of 5 sentinel needle biopsy positive for metastatic lobular carcinoma  Final pathology with new findings such as invasive mammary carcinoma with lobular features. Patient will benefit of bilateral breast MRI. The anterior margin  is positive. Patient will need at least reexcision of the anterior margin. Having 4 out of 5 sentinel lymph node positive for metastatic liver carcinoma, patient will benefit of complete axillary node dissection. As per discussion with medical oncology further testing is being done to decide if patient would benefit of adjuvant chemotherapy. Patient may also benefit of adjuvant radiation therapy. Patient will benefit of adjuvant endocrine therapy. Patient ready started adjuvant endocrine therapy. Patient will also need a PET scan to rule out more advanced disease and final staging before surgical intervention. If patient is found with stage IV disease, no surgical intervention will be beneficial.  PET scan negative for distant metastatic disease. MRI negative for other concerning malignancy on either breast. Will proceed with re excision of superior margin and completion axillary node dissection.   All the discussion was done with patient and her 2 daughters. They understood the plan and agreed with current plan.  Malignant neoplasm of upper-outer quadrant of right breast in female, estrogen receptor positive (CMS/HHS-HCC) [C50.411, Z17.0]  Lucas Sjogren, MD

## 2024-03-13 NOTE — Anesthesia Procedure Notes (Signed)
 Procedure Name: LMA Insertion Date/Time: 03/13/2024 8:34 AM  Performed by: Lorrene Camelia LABOR, CRNAPre-anesthesia Checklist: Patient identified, Patient being monitored, Timeout performed, Emergency Drugs available and Suction available Patient Re-evaluated:Patient Re-evaluated prior to induction Oxygen Delivery Method: Circle system utilized Preoxygenation: Pre-oxygenation with 100% oxygen Induction Type: IV induction Ventilation: Mask ventilation without difficulty LMA: LMA inserted LMA Size: 4.0 Tube type: Oral Number of attempts: 1 Placement Confirmation: positive ETCO2 and breath sounds checked- equal and bilateral Tube secured with: Tape Dental Injury: Teeth and Oropharynx as per pre-operative assessment

## 2024-03-13 NOTE — Anesthesia Postprocedure Evaluation (Signed)
 Anesthesia Post Note  Patient: Rebecca Patton  Procedure(s) Performed: BREAST LUMPECTOMY (Right: Breast) LYMPHADENECTOMY, AXILLARY (Right: Breast)  Patient location during evaluation: PACU Anesthesia Type: General Level of consciousness: awake and alert Pain management: pain level controlled Vital Signs Assessment: post-procedure vital signs reviewed and stable Respiratory status: spontaneous breathing, nonlabored ventilation and respiratory function stable Cardiovascular status: blood pressure returned to baseline and stable Postop Assessment: no apparent nausea or vomiting Anesthetic complications: no   No notable events documented.   Last Vitals:  Vitals:   03/13/24 1220 03/13/24 1230  BP:  (!) 123/52  Pulse: (!) 58 (!) 58  Resp: 15 10  Temp:    SpO2: 93% 95%    Last Pain:  Vitals:   03/13/24 1230  TempSrc:   PainSc: Asleep                 Fairy POUR Antasia Haider

## 2024-03-13 NOTE — Op Note (Signed)
 Preoperative diagnosis: Right breast carcinoma.  Postoperative diagnosis: Same.   Procedure: Right breast partial mastectomy.                      Right axillar node dissection  Anesthesia: GETA  Surgeon: Dr. Cesar Coe  Wound Classification: Clean  Indications: Patient is a 78 y.o. female with a right breast invasive lobular carcinoma.  She had partial mastectomy with sentinel node biopsy showing residual carcinoma on the superior margin.  MRI with suspicious enhancement.  Reexcision of the anterior superior margin was indicated for oncologic treatment.  Sentinel node biopsy showed 4 out of 5 lymph node positive for metastatic disease.  Completion axillary node dissection indicated for oncologic treatment.  Findings: 1.  No gross abdominal pathology identified  Description of procedure: The patient was taken to the operating room and placed supine on the operating table, and after general anesthesia the right chest and axilla were prepped and draped in the usual sterile fashion. A time-out was completed verifying correct patient, procedure, site, positioning, and implant(s) and/or special equipment prior to beginning this procedure.  Right breast previous incision was opened.  Seroma was aspirated.  The superior margin was excised.  The specimen was oriented and sent to pathology.  Then proceeded to excise the anterior margin which was also oriented and sent to pathology. Then I proceeded to perform the completion axillary node dissection.  An incision was made on the right axillary area.  Dissection was taken down to the clavipectoral fascia was then incised along the edge of the pectoralis major and the pectoralis major and minor were freed from surrounding fat and nodal tissue. Dissection progressed first under the pectoralis major and then under the pectoralis minor muscle. The pectoral muscles were retracted medially with a Richardson retractor. The medial pectoral neurovascular bundle  was identified and preserved. The level II nodal tissue deep in the pectoralis minor was included in the dissection. The axillary vein was then identified and cleared of overlying fat. The first branch off the axillary vein was clamped, divided, and tied with 2-0 silk ties. The thoracodorsal nerve was then identified deep to the ligated vein and preserved. The long thoracic nerve was then identified along the edge of the latissimus dorsi on the chest wall and preserved. The remaining nodal tissue between these nerves was then carefully removed, taking care to protect the nerves. The specimen, consisting of breast and attached nodal tissue, was then excised by dividing the remaining lateral pedical and sent to pathology.  The wound was irrigated and hemostasis was achieved.  15 French drain was placed connecting the axillary and right breast cavity.  Both incisions were closed in layers with 3-0 Vicryl subcutaneously and 4-0 Monocryl subcuticular. Dermabond was applied.  Specimen: Right Breast superior and anterior margins                      Right axillary node dissection  Complications: None  Estimated Blood Loss: 25 mL

## 2024-03-13 NOTE — Transfer of Care (Signed)
 Immediate Anesthesia Transfer of Care Note  Patient: Rebecca Patton  Procedure(s) Performed: BREAST LUMPECTOMY (Right: Breast) LYMPHADENECTOMY, AXILLARY (Right: Breast)  Patient Location: PACU  Anesthesia Type:General  Level of Consciousness: drowsy and patient cooperative  Airway & Oxygen Therapy: Patient Spontanous Breathing and Patient connected to nasal cannula oxygen  Post-op Assessment: Report given to RN and Post -op Vital signs reviewed and stable  Post vital signs: Reviewed and stable  Last Vitals:  Vitals Value Taken Time  BP 144/63 03/13/24 10:54  Temp 36.8 C 03/13/24 10:54  Pulse 81 03/13/24 10:55  Resp    SpO2 98 % 03/13/24 10:55  Vitals shown include unfiled device data.  Last Pain:  Vitals:   03/13/24 1054  TempSrc:   PainSc: 0-No pain         Complications: No notable events documented.

## 2024-03-13 NOTE — Discharge Instructions (Signed)
  Diet: Resume home heart healthy regular diet.   Activity: No heavy lifting >20 pounds (children, pets, laundry, garbage) or strenuous activity until follow-up, but light activity and walking are encouraged. Do not drive or drink alcohol if taking narcotic pain medications.  Wound care: May shower with soapy water  and pat dry (do not rub incisions), but no baths or submerging incision underwater until follow-up. (no swimming)   Empty and chart drain daily.   Medications: Resume all home medications. For mild to moderate pain: acetaminophen  (Tylenol ) or ibuprofen  (if no kidney disease). Combining Tylenol  with alcohol can substantially increase your risk of causing liver disease. Narcotic pain medications, if prescribed, can be used for severe pain, though may cause nausea, constipation, and drowsiness. Do not combine Tylenol  and Norco within a 6 hour period as Norco contains Tylenol . If you do not need the narcotic pain medication, you do not need to fill the prescription.  Call office [phone]) at any time if any questions, worsening pain, fevers/chills, bleeding, drainage from incision site, or other concerns.

## 2024-03-13 NOTE — Anesthesia Preprocedure Evaluation (Signed)
 Anesthesia Evaluation  Patient identified by MRN, date of birth, ID band Patient awake    Reviewed: Allergy & Precautions, NPO status , Patient's Chart, lab work & pertinent test results  History of Anesthesia Complications Negative for: history of anesthetic complications  Airway Mallampati: III  TM Distance: <3 FB Neck ROM: full    Dental  (+) Chipped   Pulmonary neg shortness of breath, former smoker   Pulmonary exam normal        Cardiovascular Exercise Tolerance: Good hypertension, (-) angina (-) Past MI and (-) DOE Normal cardiovascular exam+ dysrhythmias      Neuro/Psych  PSYCHIATRIC DISORDERS      negative neurological ROS     GI/Hepatic Neg liver ROS,GERD  Controlled,,  Endo/Other  Hypothyroidism    Renal/GU      Musculoskeletal   Abdominal   Peds  Hematology negative hematology ROS (+)   Anesthesia Other Findings Past Medical History: No date: Anxiety No date: Arthritis 09/2020: COVID-19 No date: Depression No date: Dysrhythmia     Comment:  ST No date: GERD (gastroesophageal reflux disease) No date: High blood pressure No date: Hyperlipidemia No date: Hypothyroidism No date: Malignant neoplasm of upper-outer quadrant of right breast,  estrogen receptor positive (HCC) 2006: MRSA infection     Comment:  on nose 10/13/2023: Pneumonia No date: Pre-diabetes No date: Supraumbilical hernia No date: Symptomatic cholelithiasis 2009: Uterine cancer (HCC)  Past Surgical History: 09/13/2007: ABDOMINAL HYSTERECTOMY     Comment:  BSO 02/19/2024: AXILLARY SENTINEL NODE BIOPSY; Right     Comment:  Procedure: BIOPSY, LYMPH NODE, SENTINEL, AXILLARY;                Surgeon: Rodolph Romano, MD;  Location: [facility];               Service: General;  Laterality: Right; 01/24/2024: BREAST BIOPSY; Right     Comment:  US  RT BREAST BX W LOC DEV EA ADD LESION IMG BX SPEC US                GUIDE 01/24/2024  [facility] 01/24/2024: BREAST BIOPSY; Right     Comment:  US  RT BREAST BX W LOC DEV 1ST LESION IMG BX SPEC US                GUIDE 01/24/2024 [facility] 01/24/2024: BREAST BIOPSY; Right     Comment:  US  RT BREAST BX W LOC DEV EA ADD LESION IMG BX SPEC US                GUIDE 01/24/2024 [facility] 02/19/2024: BREAST BIOPSY; Right     Comment:  MM RT PLC BREAST LOC DEV   1ST LESION  INC MAMMO GUIDE               02/19/2024 [facility] 02/19/2024: BREAST LUMPECTOMY; Right     Comment:  Procedure: BREAST LUMPECTOMY;  Surgeon: Rodolph Romano, MD;  Location: [facility];  Service: General;                Laterality: Right;  with needle localization No date: BUNIONECTOMY No date: CHOLECYSTECTOMY 01/23/2020: COLONOSCOPY WITH PROPOFOL ; N/A     Comment:  Procedure: COLONOSCOPY WITH PROPOFOL ;  Surgeon: Toledo,               Ladell POUR, MD;  Location: [facility];  Service:  Gastroenterology;  Laterality: N/A; No date: EXCISION NEUROMA 05/18/2018: INSERTION OF MESH; N/A     Comment:  Procedure: INSERTION OF MESH;  Surgeon: Nicholaus Selinda Birmingham, MD;  Location: [facility];  Service: General;                Laterality: N/A; No date: NASAL SINUS SURGERY No date: TUBAL LIGATION 05/18/2018: UMBILICAL HERNIA REPAIR; N/A     Comment:  Procedure: HERNIA REPAIR SUPRA-UMBILICAL ADULT;                Surgeon: Nicholaus Selinda Birmingham, MD;  Location: [facility];                Service: General;  Laterality: N/A;  BMI    Body Mass Index: 32.77 kg/m      Reproductive/Obstetrics negative OB ROS                              Anesthesia Physical Anesthesia Plan  ASA: 3  Anesthesia Plan: General LMA   Post-op Pain Management:    Induction: Intravenous  PONV Risk Score and Plan: Dexamethasone , Ondansetron , Midazolam  and Treatment may vary due to age or medical condition  Airway Management Planned: LMA  Additional Equipment:    Intra-op Plan:   Post-operative Plan: Extubation in OR  Informed Consent: I have reviewed the patients History and Physical, chart, labs and discussed the procedure including the risks, benefits and alternatives for the proposed anesthesia with the patient or authorized representative who has indicated his/her understanding and acceptance.     Dental Advisory Given  Plan Discussed with: Anesthesiologist, CRNA and Surgeon  Anesthesia Plan Comments: (Patient consented for risks of anesthesia including but not limited to:  - adverse reactions to medications - damage to eyes, teeth, lips or other oral mucosa - nerve damage due to positioning  - sore throat or hoarseness - Damage to heart, brain, nerves, lungs, other parts of body or loss of life  Patient voiced understanding and assent.)        Anesthesia Quick Evaluation

## 2024-03-14 ENCOUNTER — Encounter: Payer: Self-pay | Admitting: General Surgery

## 2024-03-15 LAB — SURGICAL PATHOLOGY

## 2024-03-18 ENCOUNTER — Encounter: Payer: Self-pay | Admitting: *Deleted

## 2024-03-18 ENCOUNTER — Ambulatory Visit: Admitting: Oncology

## 2024-03-18 NOTE — Progress Notes (Signed)
 Spoke with Rebecca Patton about rescheduling her radiation consult.  She is having a lot of pain since her re-excision and would like me to call her back on 7/14 to discuss.

## 2024-03-22 ENCOUNTER — Encounter: Payer: Self-pay | Admitting: *Deleted

## 2024-03-22 NOTE — Progress Notes (Addendum)
 [name] would like to re-visit the possibility of chemo.  She declined meeting with a covering medical oncologist so she will see Dr. Melanee on 8/5 to discuss further.  She declined rescheduling appointment with Dr. Lenn at this time and would rather wait to meet with him until after her discussion with Dr. Melanee.

## 2024-03-25 DIAGNOSIS — D241 Benign neoplasm of right breast: Secondary | ICD-10-CM | POA: Diagnosis not present

## 2024-03-25 DIAGNOSIS — C50411 Malignant neoplasm of upper-outer quadrant of right female breast: Secondary | ICD-10-CM | POA: Diagnosis not present

## 2024-03-25 DIAGNOSIS — N6091 Unspecified benign mammary dysplasia of right breast: Secondary | ICD-10-CM | POA: Diagnosis not present

## 2024-03-25 DIAGNOSIS — C773 Secondary and unspecified malignant neoplasm of axilla and upper limb lymph nodes: Secondary | ICD-10-CM | POA: Diagnosis not present

## 2024-03-25 DIAGNOSIS — Z170 Estrogen receptor positive status [ER+]: Secondary | ICD-10-CM | POA: Diagnosis not present

## 2024-03-26 DIAGNOSIS — C50411 Malignant neoplasm of upper-outer quadrant of right female breast: Secondary | ICD-10-CM | POA: Diagnosis not present

## 2024-03-26 DIAGNOSIS — Z170 Estrogen receptor positive status [ER+]: Secondary | ICD-10-CM | POA: Diagnosis not present

## 2024-03-27 ENCOUNTER — Inpatient Hospital Stay: Admitting: Occupational Therapy

## 2024-04-02 ENCOUNTER — Telehealth: Payer: Self-pay | Admitting: Occupational Therapy

## 2024-04-02 NOTE — Telephone Encounter (Signed)
 Pt r/s appt from 04/03/2024 stated she is not healed from surgery and her daughter cannot bring her to the appt.  Appt is r/s to 04/10/2024.   Pt wants Deland to call her to tell her what this appt is about and what she will be doing at this appt.   Thank you

## 2024-04-03 ENCOUNTER — Ambulatory Visit: Admitting: Occupational Therapy

## 2024-04-10 ENCOUNTER — Ambulatory Visit: Attending: Oncology | Admitting: Occupational Therapy

## 2024-04-10 ENCOUNTER — Encounter: Payer: Self-pay | Admitting: Occupational Therapy

## 2024-04-10 DIAGNOSIS — M25611 Stiffness of right shoulder, not elsewhere classified: Secondary | ICD-10-CM | POA: Insufficient documentation

## 2024-04-10 DIAGNOSIS — L905 Scar conditions and fibrosis of skin: Secondary | ICD-10-CM | POA: Diagnosis not present

## 2024-04-10 NOTE — Therapy (Signed)
 OUTPATIENT OCCUPATIONAL THERAPY BREAST CANCER POSTOP EVALUATION   Patient Name: Rebecca Patton MRN: [id number] DOB:1946/07/16, 78 y.o., female Today's Date: 04/10/2024  END OF SESSION:  OT End of Session - 04/10/24 1927     Visit Number 1    Number of Visits 12    Date for OT Re-Evaluation 07/03/24    OT Start Time 1402    OT Stop Time 1435    OT Time Calculation (min) 33 min    Activity Tolerance Patient tolerated treatment well    Behavior During Therapy [facility] for tasks assessed/performed          Past Medical History:  Diagnosis Date   Anxiety    Arthritis    COVID-19 09/2020   Depression    Dysrhythmia    ST   GERD (gastroesophageal reflux disease)    High blood pressure    Hyperlipidemia    Hypothyroidism    Malignant neoplasm of upper-outer quadrant of right breast, estrogen receptor positive (HCC)    MRSA infection 2006   on nose   Pneumonia 10/13/2023   Pre-diabetes    Supraumbilical hernia    Symptomatic cholelithiasis    Uterine cancer (HCC) 2009   Past Surgical History:  Procedure Laterality Date   ABDOMINAL HYSTERECTOMY  09/13/2007   BSO   AXILLARY LYMPH NODE DISSECTION Right 03/13/2024   Procedure: REDGIE HARD;  Surgeon: Rodolph Romano, MD;  Location: [facility];  Service: General;  Laterality: Right;   AXILLARY SENTINEL NODE BIOPSY Right 02/19/2024   Procedure: BIOPSY, LYMPH NODE, SENTINEL, AXILLARY;  Surgeon: Rodolph Romano, MD;  Location: [facility];  Service: General;  Laterality: Right;   BREAST BIOPSY Right 01/24/2024   US  RT BREAST BX W LOC DEV EA ADD LESION IMG BX SPEC US  GUIDE 01/24/2024 [facility]   BREAST BIOPSY Right 01/24/2024   US  RT BREAST BX W LOC DEV 1ST LESION IMG BX SPEC US  GUIDE 01/24/2024 [facility]   BREAST BIOPSY Right 01/24/2024   US  RT BREAST BX W LOC DEV EA ADD LESION IMG BX SPEC US  GUIDE 01/24/2024 [facility]   BREAST BIOPSY Right 02/19/2024   MM RT PLC BREAST LOC DEV   1ST LESION  INC  MAMMO GUIDE 02/19/2024 [facility]   BREAST LUMPECTOMY Right 02/19/2024   Procedure: BREAST LUMPECTOMY;  Surgeon: Rodolph Romano, MD;  Location: [facility];  Service: General;  Laterality: Right;  with needle localization   BREAST LUMPECTOMY Right 03/13/2024   Procedure: BREAST LUMPECTOMY;  Surgeon: Rodolph Romano, MD;  Location: [facility];  Service: General;  Laterality: Right;   BUNIONECTOMY     CHOLECYSTECTOMY     COLONOSCOPY WITH PROPOFOL  N/A 01/23/2020   Procedure: COLONOSCOPY WITH PROPOFOL ;  Surgeon: Toledo, Ladell POUR, MD;  Location: [facility];  Service: Gastroenterology;  Laterality: N/A;   EXCISION NEUROMA     INSERTION OF MESH N/A 05/18/2018   Procedure: INSERTION OF MESH;  Surgeon: Nicholaus Selinda Birmingham, MD;  Location: [facility];  Service: General;  Laterality: N/A;   NASAL SINUS SURGERY     TUBAL LIGATION     UMBILICAL HERNIA REPAIR N/A 05/18/2018   Procedure: HERNIA REPAIR SUPRA-UMBILICAL ADULT;  Surgeon: Nicholaus Selinda Birmingham, MD;  Location: [facility];  Service: General;  Laterality: N/A;   Patient Active Problem List   Diagnosis Date Noted   Genetic testing 02/14/2024   Malignant neoplasm of upper-outer quadrant of right breast in female, estrogen receptor positive (HCC) 02/01/2024   Symptomatic cholelithiasis    Incisional hernia, without obstruction or  gangrene    Supraumbilical hernia 05/15/2018   Anxiety 03/20/2015   Arthritis 03/20/2015   Benign neoplasm of colon 03/20/2015   Clinical depression 03/20/2015   Malignant neoplasm of corpus uteri, except isthmus (HCC) 03/20/2015   Essential (primary) hypertension 03/20/2015   HLD (hyperlipidemia) 03/20/2015   Cannot sleep 03/20/2015   Osteopenia 03/20/2015   Adult hypothyroidism 03/20/2015    PCP: Dr Bertrum  REFERRING PROVIDER: Dr Melanee MART DIAG: Left shoulder stiffness as well as seroma post lumpectomy.  And reexcision  THERAPY DIAG:  Scar condition and fibrosis of skin  Stiffness of right  shoulder, not elsewhere classified  Rationale for Evaluation and Treatment: Rehabilitation  ONSET DATE: 02/19/24  SUBJECTIVE:                                                                                                                                                                                           SUBJECTIVE STATEMENT: I had a lumpectomy in June.  But then I had positive margins and they had to go back and and removed a lot of lymph nodes in July.  And then I developed the seroma and that it keeps on draining.  I am going to the beach this weekend and the end of the month or August I am going on a cruise  PERTINENT HISTORY:  Patient was diagnosed with right  breast cancer -patient had right lumpectomy on 02/19/2024 by Dr. Cesar.  Patient had positive margins and had a reexcision on 03/13/2024.  11 out of 26 lymph nodes with axillary dissection was positive.  Patient meeting with Dr. Melanee about plan of care next week.  Patient developed a seroma after second incision.  Band-Aid in place reports still some drainage.  PATIENT GOALS:   reduce lymphedema risk and learn post op HEP.   PAIN:  Are you having pain?  No pain reported  PRECAUTIONS: Active CA ; right right upper extremity lymphedema    HAND DOMINANCE: right  WEIGHT BEARING RESTRICTIONS: None  FALLS:  Has patient fallen in last 6 months? No  LIVING ENVIRONMENT: Patient lives with: Alone  OCCUPATION: Patient is retired.  Patient lives alone active in activities around the house.     OBJECTIVE:  COGNITION: Overall cognitive status: Within functional limits for tasks assessed    POSTURE:  Rounded shoulders  UPPER EXTREMITY AROM/PROM:  A/PROM RIGHT   eval   Shoulder extension   Shoulder flexion 150  Shoulder abduction 150  Shoulder internal rotation   Shoulder external rotation 85    (Blank rows = not tested)  A/PROM LEFT   eval  Shoulder extension  Shoulder flexion   Shoulder abduction    Shoulder internal rotation   Shoulder external rotation     (Blank rows = not tested)  CERVICAL AROM: All within normal limits:    UPPER EXTREMITY STRENGTH: Not tested  LYMPHEDEMA ASSESSMENTS:   LYMPHEDEMA/ONCOLOGY QUESTIONNAIRE - 04/10/24 0001       Right Upper Extremity Lymphedema   15 cm Proximal to Olecranon Process 37 cm    10 cm Proximal to Olecranon Process 34.2 cm    Olecranon Process 27.5 cm    15 cm Proximal to Ulnar Styloid Process 23.5 cm      Left Upper Extremity Lymphedema   15 cm Proximal to Olecranon Process 34.5 cm    10 cm Proximal to Olecranon Process 31.5 cm    Olecranon Process 26.5 cm    15 cm Proximal to Ulnar Styloid Process 24.2 cm          L-DEX LYMPHEDEMA SCREENING:  The patient was assessed using the L-Dex machine today to produce a lymphedema index baseline score. The patient will be reassessed on a regular basis (typically every 3 months) to obtain new L-Dex scores. If the score is > 6.5 points away from his/her baseline score indicating onset of subclinical lymphedema, it will be recommended to wear a compression garment for 4 weeks, 12 hours per day and then be reassessed. If the score continues to be > 6.5 points from baseline at reassessment, we will initiate lymphedema treatment. Assessing in this manner has a 95% rate of preventing clinically significant lymphedema.   L-DEX FLOWSHEETS - 04/10/24 1900       L-DEX LYMPHEDEMA SCREENING   Measurement Type Unilateral    L-DEX MEASUREMENT EXTREMITY Upper Extremity    POSITION  Standing    DOMINANT SIDE Right    At Risk Side Right    L-DEX SCORE (UNILATERAL) 7.4           PATIENT EDUCATION:  Education details: Lymphedema risk reduction and post op shoulder/posture HEP Person educated: Patient Education method: Explanation, Demonstration, Handout Education comprehension: Patient verbalized understanding and returned demonstration  HOME EXERCISE PROGRAM: Lymphedema education was  done with patient.  Blood signs and symptoms as well as precautions and prevention.  Handout from cancer journal provided and reviewed with patient.  Patient risk for lymphedema hide because of the amount of lymph nodes removed.  Patient's upper arm circumference increased but patient also postop as well as seroma drained 2 times.  Will continue to monitor.  Patient endrange active range of motion limited with slight pull over incision.  Reviewed with patient active assisted range of motion for flexion abduction on the wall and external rotation in supine.  Patient to keep pain or pull less than a 1/10 with home exercises.  No pulling and pushing activating the pec against resistance or heavy lifting.  Patient to perform 10 reps to 3 times a day Also recommend for patient to follow surgeons instructions about maintaining compression over seroma.  Patient to see surgeon tomorrow. CLINICAL IMPRESSION: Patient presented OT evaluation after having right lumpectomy on 02/19/2024 by Dr. Cesar.  Had a reexcision on 7/10/17/2023 because of positive margins.  At that time 11 out of 26 axillary lymph nodes was positive.  Patient developed seroma on the right breast needed drainage.  Patient arrived with Band-Aid in place.  Patient following up with surgeon in the next 2 days.  Patient limited in endrange range of motion with the pull or stretch.  Patient was educated  in signs and symptoms as well as precautions and prevention of lymphedema.  Because of high risk for lymphedema on the right upper extremity.  Also reviewed with her home exercises and the importance of keeping it pain-free with a slight pull overhead as well as compression on seroma.  Patient will be out of town over the weekend and will follow-up with me next week.  Patient can benefit from  OT reassessment to determine continues needs and from L-Dex screens every 3 months for 2 years to detect subclinical lymphedema.  Pt will benefit from skilled  therapeutic intervention to improve on the following deficits: Decreased knowledge of precautions and lymphedema education, impaired UE functional use, pain, decreased ROM, postural dysfunction.   OT treatment/interventions: ADL/self-care home management, pt/family education, therapeutic exercise,manual therapy  REHAB POTENTIAL: Good  CLINICAL DECISION MAKING: Stable/uncomplicated  EVALUATION COMPLEXITY: Low   GOALS: Goals reviewed with patient? YES  LONG TERM GOALS: (STG=LTG)    Name Target Date Goal status  1 Pt will be able to verbalize understanding of pertinent lymphedema risk reduction practices relevant to her dx specifically related to skin care.  Baseline:  No knowledge 12 weeks Initial  2 Pt will be able to return demo and/or verbalize understanding of the post op HEP related to regaining shoulder ROM. Baseline:  No knowledge Today Achieved at eval       4 Pt will demo she has regained full shoulder ROM and function post operatively compared to baselines.  Baseline: See objective measurements taken today. 12 weeks Initial    PLAN:  OT FREQUENCY/DURATION: EVAL and12 follow up appointments depending on progress      Occupational Therapy Information for After Breast Cancer Surgery/Treatment:  Lymphedema is a swelling condition that you may be at risk for in your arm if you have lymph nodes removed from the armpit area.  After a sentinel node biopsy, the risk is approximately 5-9% and is higher after an axillary node dissection.  There is treatment available for this condition and it is not life-threatening.  Contact your physician or occupational therapist with concerns. You may begin the 4 shoulder/posture exercises (see additional sheet) when permitted by your physician (typically a week after surgery).  If you have drains, you may need to wait until those are removed before beginning range of motion exercises.  A general recommendation is to not lift your arms above  shoulder height until drains are removed.  These exercises should be done to your tolerance and gently.  This is not a no pain/no gain type of recovery so listen to your body and stretch into the range of motion that you can tolerate, stopping if you have pain.  If you are having immediate reconstruction, ask your plastic surgeon about doing exercises as he or she may want you to wait. .  While undergoing any medical procedure or treatment, try to avoid blood pressure being taken or needle sticks from occurring on the arm on the side of cancer.   This recommendation begins after surgery and continues for the rest of your life.  This may help reduce your risk of getting lymphedema (swelling in your arm). An excellent resource for those seeking information on lymphedema is the National [employer] Network's web site. It can be accessed at www.lymphnet.org If you notice swelling in your hand, arm or breast at any time following surgery (even if it is many years from now), please contact your doctor or occupational therapist to discuss this.  Lymphedema can be  treated at any time but it is easier for you if it is treated early on.  If you feel like your shoulder motion is not returning to normal in a reasonable amount of time, please contact your surgeon or occupational therapist.  [facility] [phone]. [address] [place], [place] [address]        Ancel Peters, OTR/L,CLT 04/10/2024, 7:32 PM

## 2024-04-16 ENCOUNTER — Inpatient Hospital Stay: Attending: Oncology | Admitting: Oncology

## 2024-04-16 ENCOUNTER — Encounter: Payer: Self-pay | Admitting: Oncology

## 2024-04-16 VITALS — BP 138/54 | HR 67 | Temp 97.2°F | Resp 19 | Ht 66.0 in | Wt 207.5 lb

## 2024-04-16 DIAGNOSIS — L905 Scar conditions and fibrosis of skin: Secondary | ICD-10-CM | POA: Diagnosis not present

## 2024-04-16 DIAGNOSIS — M25611 Stiffness of right shoulder, not elsewhere classified: Secondary | ICD-10-CM | POA: Diagnosis not present

## 2024-04-16 DIAGNOSIS — E039 Hypothyroidism, unspecified: Secondary | ICD-10-CM | POA: Insufficient documentation

## 2024-04-16 DIAGNOSIS — Z170 Estrogen receptor positive status [ER+]: Secondary | ICD-10-CM | POA: Diagnosis not present

## 2024-04-16 DIAGNOSIS — E785 Hyperlipidemia, unspecified: Secondary | ICD-10-CM | POA: Diagnosis not present

## 2024-04-16 DIAGNOSIS — C50411 Malignant neoplasm of upper-outer quadrant of right female breast: Secondary | ICD-10-CM | POA: Insufficient documentation

## 2024-04-16 NOTE — Progress Notes (Signed)
 Patient states she's been doing okay, but would like insight on her options about going back on chemo.

## 2024-04-17 ENCOUNTER — Inpatient Hospital Stay: Admitting: Occupational Therapy

## 2024-04-17 DIAGNOSIS — L905 Scar conditions and fibrosis of skin: Secondary | ICD-10-CM | POA: Diagnosis not present

## 2024-04-17 DIAGNOSIS — E039 Hypothyroidism, unspecified: Secondary | ICD-10-CM | POA: Diagnosis not present

## 2024-04-17 DIAGNOSIS — M25611 Stiffness of right shoulder, not elsewhere classified: Secondary | ICD-10-CM | POA: Diagnosis not present

## 2024-04-17 DIAGNOSIS — Z170 Estrogen receptor positive status [ER+]: Secondary | ICD-10-CM | POA: Diagnosis not present

## 2024-04-17 DIAGNOSIS — E785 Hyperlipidemia, unspecified: Secondary | ICD-10-CM | POA: Diagnosis not present

## 2024-04-17 DIAGNOSIS — C50411 Malignant neoplasm of upper-outer quadrant of right female breast: Secondary | ICD-10-CM | POA: Diagnosis not present

## 2024-04-17 NOTE — Therapy (Signed)
 OUTPATIENT OCCUPATIONAL THERAPY BREAST CANCER POSTOP TREATMENT   Patient Name: GWENITH TSCHIDA MRN: [id number] DOB:Jun 28, 1946, 78 y.o., female Today's Date: 04/17/2024  END OF SESSION:  OT End of Session - 04/17/24 1420     Visit Number 2    Number of Visits 12    Date for OT Re-Evaluation 07/03/24    OT Start Time 1305    OT Stop Time 1330    OT Time Calculation (min) 25 min    Activity Tolerance Patient tolerated treatment well    Behavior During Therapy [facility] for tasks assessed/performed          Past Medical History:  Diagnosis Date   Anxiety    Arthritis    COVID-19 09/2020   Depression    Dysrhythmia    ST   GERD (gastroesophageal reflux disease)    High blood pressure    Hyperlipidemia    Hypothyroidism    Malignant neoplasm of upper-outer quadrant of right breast, estrogen receptor positive (HCC)    MRSA infection 2006   on nose   Pneumonia 10/13/2023   Pre-diabetes    Supraumbilical hernia    Symptomatic cholelithiasis    Uterine cancer (HCC) 2009   Past Surgical History:  Procedure Laterality Date   ABDOMINAL HYSTERECTOMY  09/13/2007   BSO   AXILLARY LYMPH NODE DISSECTION Right 03/13/2024   Procedure: REDGIE HARD;  Surgeon: Rodolph Romano, MD;  Location: [facility];  Service: General;  Laterality: Right;   AXILLARY SENTINEL NODE BIOPSY Right 02/19/2024   Procedure: BIOPSY, LYMPH NODE, SENTINEL, AXILLARY;  Surgeon: Rodolph Romano, MD;  Location: [facility];  Service: General;  Laterality: Right;   BREAST BIOPSY Right 01/24/2024   US  RT BREAST BX W LOC DEV EA ADD LESION IMG BX SPEC US  GUIDE 01/24/2024 [facility]   BREAST BIOPSY Right 01/24/2024   US  RT BREAST BX W LOC DEV 1ST LESION IMG BX SPEC US  GUIDE 01/24/2024 [facility]   BREAST BIOPSY Right 01/24/2024   US  RT BREAST BX W LOC DEV EA ADD LESION IMG BX SPEC US  GUIDE 01/24/2024 [facility]   BREAST BIOPSY Right 02/19/2024   MM RT PLC BREAST LOC DEV   1ST LESION  INC MAMMO  GUIDE 02/19/2024 [facility]   BREAST LUMPECTOMY Right 02/19/2024   Procedure: BREAST LUMPECTOMY;  Surgeon: Rodolph Romano, MD;  Location: [facility];  Service: General;  Laterality: Right;  with needle localization   BREAST LUMPECTOMY Right 03/13/2024   Procedure: BREAST LUMPECTOMY;  Surgeon: Rodolph Romano, MD;  Location: [facility];  Service: General;  Laterality: Right;   BUNIONECTOMY     CHOLECYSTECTOMY     COLONOSCOPY WITH PROPOFOL  N/A 01/23/2020   Procedure: COLONOSCOPY WITH PROPOFOL ;  Surgeon: Toledo, Ladell POUR, MD;  Location: [facility];  Service: Gastroenterology;  Laterality: N/A;   EXCISION NEUROMA     INSERTION OF MESH N/A 05/18/2018   Procedure: INSERTION OF MESH;  Surgeon: Nicholaus Selinda Birmingham, MD;  Location: [facility];  Service: General;  Laterality: N/A;   NASAL SINUS SURGERY     TUBAL LIGATION     UMBILICAL HERNIA REPAIR N/A 05/18/2018   Procedure: HERNIA REPAIR SUPRA-UMBILICAL ADULT;  Surgeon: Nicholaus Selinda Birmingham, MD;  Location: [facility];  Service: General;  Laterality: N/A;   Patient Active Problem List   Diagnosis Date Noted   Genetic testing 02/14/2024   Malignant neoplasm of upper-outer quadrant of right breast in female, estrogen receptor positive (HCC) 02/01/2024   Symptomatic cholelithiasis    Incisional hernia, without obstruction or  gangrene    Supraumbilical hernia 05/15/2018   Anxiety 03/20/2015   Arthritis 03/20/2015   Benign neoplasm of colon 03/20/2015   Clinical depression 03/20/2015   Malignant neoplasm of corpus uteri, except isthmus (HCC) 03/20/2015   Essential (primary) hypertension 03/20/2015   HLD (hyperlipidemia) 03/20/2015   Cannot sleep 03/20/2015   Osteopenia 03/20/2015   Adult hypothyroidism 03/20/2015    PCP: Dr Bertrum  REFERRING PROVIDER: Dr Melanee MART DIAG: Left shoulder stiffness as well as seroma post lumpectomy.  And reexcision  THERAPY DIAG:  Scar condition and fibrosis of skin  Stiffness of right shoulder,  not elsewhere classified  Rationale for Evaluation and Treatment: Rehabilitation  ONSET DATE: 02/19/24  SUBJECTIVE:                                                                                                                                                                                           SUBJECTIVE STATEMENT: I had a lumpectomy in June.  But then I had positive margins and they had to go back and and removed a lot of lymph nodes in July.  Dr. Cesar drained again my seroma after seeing you before I went for the weekend to the beach.  He also put on antibiotic - I am still using that -probably halfway through. PERTINENT HISTORY:  Patient was diagnosed with right  breast cancer -patient had right lumpectomy on 02/19/2024 by Dr. Cesar.  Patient had positive margins and had a reexcision on 03/13/2024.  11 out of 26 lymph nodes with axillary dissection was positive.  Patient meeting with Dr. Melanee about plan of care next week.  Patient developed a seroma after second incision.  Band-Aid in place reports still some drainage.  PATIENT GOALS:   reduce lymphedema risk and learn post op HEP.   PAIN:  Are you having pain?  No pain reported  PRECAUTIONS: Active CA ; right right upper extremity lymphedema    HAND DOMINANCE: right  WEIGHT BEARING RESTRICTIONS: None  FALLS:  Has patient fallen in last 6 months? No  LIVING ENVIRONMENT: Patient lives with: Alone  OCCUPATION: Patient is retired.  Patient lives alone active in activities around the house.     OBJECTIVE:  COGNITION: Overall cognitive status: Within functional limits for tasks assessed    POSTURE:  Rounded shoulders  UPPER EXTREMITY AROM/PROM:  A/PROM RIGHT   eval  R 04/17/24  Shoulder extension    Shoulder flexion 150 160  Shoulder abduction 150 160  Shoulder internal rotation    Shoulder external rotation 85 90    (Blank rows = not tested)  A/PROM LEFT   eval  Shoulder extension   Shoulder flexion    Shoulder abduction   Shoulder internal rotation   Shoulder external rotation     (Blank rows = not tested)  CERVICAL AROM: All within normal limits:    UPPER EXTREMITY STRENGTH: Not tested  LYMPHEDEMA ASSESSMENTS:   LYMPHEDEMA/ONCOLOGY QUESTIONNAIRE - 04/17/24 0001       Right Upper Extremity Lymphedema   15 cm Proximal to Olecranon Process 36 cm    10 cm Proximal to Olecranon Process 34 cm    Olecranon Process 27.5 cm    15 cm Proximal to Ulnar Styloid Process 24 cm    10 cm Proximal to Ulnar Styloid Process 21 cm      Left Upper Extremity Lymphedema   15 cm Proximal to Olecranon Process 34.5 cm    10 cm Proximal to Olecranon Process 31.5 cm    Olecranon Process 26.5 cm    15 cm Proximal to Ulnar Styloid Process 24.4 cm    10 cm Proximal to Ulnar Styloid Process 21 cm           L-DEX LYMPHEDEMA SCREENING:  The patient was assessed using the L-Dex machine today to produce a lymphedema index baseline score. The patient will be reassessed on a regular basis (typically every 3 months) to obtain new L-Dex scores. If the score is > 6.5 points away from his/her baseline score indicating onset of subclinical lymphedema, it will be recommended to wear a compression garment for 4 weeks, 12 hours per day and then be reassessed. If the score continues to be > 6.5 points from baseline at reassessment, we will initiate lymphedema treatment. Assessing in this manner has a 95% rate of preventing clinically significant lymphedema.   L-DEX FLOWSHEETS - 04/17/24 1400       L-DEX LYMPHEDEMA SCREENING   Measurement Type Unilateral    L-DEX MEASUREMENT EXTREMITY Upper Extremity    POSITION  Standing    DOMINANT SIDE Right    At Risk Side Right    BASELINE SCORE (UNILATERAL) 3.3    L-DEX SCORE (UNILATERAL) 3.3    VALUE CHANGE (UNILAT) 0            PATIENT EDUCATION:  Education details: Lymphedema risk reduction and post op shoulder/posture HEP Person educated: Patient Education  method: Explanation, Demonstration, Handout Education comprehension: Patient verbalized understanding and returned demonstration  04/17/24 OT session Patient return after being seen last week.  Patient was performing AAROM for shoulder flexion abduction on the wall and external rotations in supine.  Patient showed great progress in active range of motion since last time.  Seroma was drained.  Patient continues with compression bra.  Patient was started on antibiotic.  Patient still continues to use antibiotic.  Patient's upper arm circumference decrease since last week - as well as her L-Dex Score on SOZO.  Will continue to monitor.   No pulling and pushing activating the pec against resistance or heavy lifting.  Patient to perform 10 reps to 3 times a day for above HEP for AAROM. Lymphedema education was done  again with patient.  Signs and symptoms as well as precautions and prevention.  Handout from cancer journal provided and reviewed with patient last time.  Patient risk for lymphedema high because of the amount of lymph nodes removed. Also recommend for patient to follow surgeons instructions about maintaining compression over seroma - pt to with increase scar tissue and fibrosis. CLINICAL IMPRESSION: Patient presented OT evaluation after having right lumpectomy on 02/19/2024 by  Dr. Cesar.  Had a reexcision on 7/10/17/2023 because of positive margins.  At that time 11 out of 26 axillary lymph nodes was positive.  Patient developed seroma on the right breast needed drainage. Pt performed active assisted range of motion for shoulder flexion, abduction and external rotation since last week- patient was educated in signs and symptoms as well as precautions and prevention of lymphedema.  Because of high risk for lymphedema on the right upper extremity.  Patient with increased scar tissue because of delayed healing as well as fibrosis.  Patient still tender and on antibiotic.  Patient to follow-up next week to  initiate some soft tissue mobilization.  Patient circumference and right upper extremity as well as L-Dex score improved greatly compared to last visit..  Patient can benefit from  OT reassessment to determine continues needs and from L-Dex screens every 3 months for 2 years to detect subclinical lymphedema.  Pt will benefit from skilled therapeutic intervention to improve on the following deficits: Decreased knowledge of precautions and lymphedema education, impaired UE functional use, pain, decreased ROM, postural dysfunction.   OT treatment/interventions: ADL/self-care home management, pt/family education, therapeutic exercise,manual therapy  REHAB POTENTIAL: Good  CLINICAL DECISION MAKING: Stable/uncomplicated  EVALUATION COMPLEXITY: Low   GOALS: Goals reviewed with patient? YES  LONG TERM GOALS: (STG=LTG)    Name Target Date Goal status  1 Pt will be able to verbalize understanding of pertinent lymphedema risk reduction practices relevant to her dx specifically related to skin care.  Baseline:  No knowledge 12 weeks Initial  2 Pt will be able to return demo and/or verbalize understanding of the post op HEP related to regaining shoulder ROM. Baseline:  No knowledge Today Achieved at eval       4 Pt will demo she has regained full shoulder ROM and function post operatively compared to baselines.  Baseline: See objective measurements taken today. 12 weeks Initial    PLAN:  OT FREQUENCY/DURATION: EVAL and12 follow up appointments depending on progress      Occupational Therapy Information for After Breast Cancer Surgery/Treatment:  Lymphedema is a swelling condition that you may be at risk for in your arm if you have lymph nodes removed from the armpit area.  After a sentinel node biopsy, the risk is approximately 5-9% and is higher after an axillary node dissection.  There is treatment available for this condition and it is not life-threatening.  Contact your physician or  occupational therapist with concerns. You may begin the 4 shoulder/posture exercises (see additional sheet) when permitted by your physician (typically a week after surgery).  If you have drains, you may need to wait until those are removed before beginning range of motion exercises.  A general recommendation is to not lift your arms above shoulder height until drains are removed.  These exercises should be done to your tolerance and gently.  This is not a no pain/no gain type of recovery so listen to your body and stretch into the range of motion that you can tolerate, stopping if you have pain.  If you are having immediate reconstruction, ask your plastic surgeon about doing exercises as he or she may want you to wait. .  While undergoing any medical procedure or treatment, try to avoid blood pressure being taken or needle sticks from occurring on the arm on the side of cancer.   This recommendation begins after surgery and continues for the rest of your life.  This may help reduce your risk of getting  lymphedema (swelling in your arm). An excellent resource for those seeking information on lymphedema is the National L[employer] Network's web site. It can be accessed at www.lymphnet.org If you notice swelling in your hand, arm or breast at any time following surgery (even if it is many years from now), please contact your doctor or occupational therapist to discuss this.  Lymphedema can be treated at any time but it is easier for you if it is treated early on.  If you feel like your shoulder motion is not returning to normal in a reasonable amount of time, please contact your surgeon or occupational therapist.  [facility] [phone]. [address] [place], [place] [address]        Ancel Peters, OTR/L,CLT 04/17/2024, 2:28 PM

## 2024-04-18 ENCOUNTER — Telehealth: Payer: Self-pay

## 2024-04-18 ENCOUNTER — Encounter: Payer: Self-pay | Admitting: *Deleted

## 2024-04-18 NOTE — Progress Notes (Signed)
 [name] called asking about having her letrozole  sent to mail order pharmacy.   Discussed with Dr. Melanee and she would like her to stop the letrozole  now.  Informed [name] and she will stop taking it as of today.

## 2024-04-18 NOTE — Telephone Encounter (Signed)
 Per Ana via secure chat Good morning, this patient left me a voicemail and said [facility] had said they reached out to Dr. Darold office for refill request...did you get that or do you know if it has been done?.  Have not received refill requests for medications as of recent.  Outbound call; patient indicated [facility] [place] but would like it instead sent to [facility] mail order pharmacy instead.  Shasta was talking with the patient at the same time on the other line and mentioned I told her to maybe hold off on sending to mail order because Dr. Melanee may want her to not take while she is on chemotherapy..  Informed patient I would forward to Dr. Melanee for review and will follow up with her; patient verbalized understanding.  Ana stated she spoke with Dr. Melanee and she said to stop Letrozole  and Shasta has already communicated that to the patient.  No further follow up needed at this time.

## 2024-04-22 ENCOUNTER — Ambulatory Visit: Attending: Oncology | Admitting: Occupational Therapy

## 2024-04-22 ENCOUNTER — Encounter: Payer: Self-pay | Admitting: Oncology

## 2024-04-22 ENCOUNTER — Encounter: Admitting: Occupational Therapy

## 2024-04-22 DIAGNOSIS — L905 Scar conditions and fibrosis of skin: Secondary | ICD-10-CM | POA: Diagnosis not present

## 2024-04-22 DIAGNOSIS — M25611 Stiffness of right shoulder, not elsewhere classified: Secondary | ICD-10-CM | POA: Diagnosis not present

## 2024-04-22 MED ORDER — LIDOCAINE-PRILOCAINE 2.5-2.5 % EX CREA: TOPICAL_CREAM | CUTANEOUS | 3 refills | Status: DC

## 2024-04-22 MED ORDER — ONDANSETRON HCL 8 MG PO TABS: ORAL_TABLET | ORAL | 1 refills | Status: DC

## 2024-04-22 MED ORDER — DEXAMETHASONE 4 MG PO TABS: ORAL_TABLET | ORAL | 1 refills | Status: DC

## 2024-04-22 MED ORDER — PROCHLORPERAZINE MALEATE 10 MG PO TABS: 10.0000 mg | ORAL_TABLET | Freq: Four times a day (QID) | ORAL | 1 refills | Status: DC | PRN

## 2024-04-22 NOTE — Therapy (Signed)
 OUTPATIENT OCCUPATIONAL THERAPY BREAST CANCER POSTOP TREATMENT   Patient Name: Rebecca Patton MRN: [id number] DOB:[birth year], 78 y.o., female Today's Date: 04/22/2024  END OF SESSION:  OT End of Session - 04/22/24 1320     Visit Number 3    Number of Visits 12    Date for OT Re-Evaluation 07/03/24    OT Start Time 1128    OT Stop Time 1206    OT Time Calculation (min) 38 min    Activity Tolerance Patient tolerated treatment well    Behavior During Therapy [facility] for tasks assessed/performed          Past Medical History:  Diagnosis Date   Anxiety    Arthritis    COVID-19 09/2020   Depression    Dysrhythmia    ST   GERD (gastroesophageal reflux disease)    High blood pressure    Hyperlipidemia    Hypothyroidism    Malignant neoplasm of upper-outer quadrant of right breast, estrogen receptor positive (HCC)    MRSA infection 2006   on nose   Pneumonia 10/13/2023   Pre-diabetes    Supraumbilical hernia    Symptomatic cholelithiasis    Uterine cancer (HCC) 2009   Past Surgical History:  Procedure Laterality Date   ABDOMINAL HYSTERECTOMY  09/13/2007   BSO   AXILLARY LYMPH NODE DISSECTION Right 03/13/2024   Procedure: REDGIE HARD;  Surgeon: Rodolph Romano, MD;  Location: [facility];  Service: General;  Laterality: Right;   AXILLARY SENTINEL NODE BIOPSY Right 02/19/2024   Procedure: BIOPSY, LYMPH NODE, SENTINEL, AXILLARY;  Surgeon: Rodolph Romano, MD;  Location: [facility];  Service: General;  Laterality: Right;   BREAST BIOPSY Right 01/24/2024   US  RT BREAST BX W LOC DEV EA ADD LESION IMG BX SPEC US  GUIDE 01/24/2024 [facility]   BREAST BIOPSY Right 01/24/2024   US  RT BREAST BX W LOC DEV 1ST LESION IMG BX SPEC US  GUIDE 01/24/2024 [facility]   BREAST BIOPSY Right 01/24/2024   US  RT BREAST BX W LOC DEV EA ADD LESION IMG BX SPEC US  GUIDE 01/24/2024 [facility]   BREAST BIOPSY Right 02/19/2024   MM RT PLC BREAST LOC DEV   1ST LESION  INC  MAMMO GUIDE 02/19/2024 [facility]   BREAST LUMPECTOMY Right 02/19/2024   Procedure: BREAST LUMPECTOMY;  Surgeon: Rodolph Romano, MD;  Location: [facility];  Service: General;  Laterality: Right;  with needle localization   BREAST LUMPECTOMY Right 03/13/2024   Procedure: BREAST LUMPECTOMY;  Surgeon: Rodolph Romano, MD;  Location: [facility];  Service: General;  Laterality: Right;   BUNIONECTOMY     CHOLECYSTECTOMY     COLONOSCOPY WITH PROPOFOL  N/A 01/23/2020   Procedure: COLONOSCOPY WITH PROPOFOL ;  Surgeon: Toledo, Ladell POUR, MD;  Location: [facility];  Service: Gastroenterology;  Laterality: N/A;   EXCISION NEUROMA     INSERTION OF MESH N/A 05/18/2018   Procedure: INSERTION OF MESH;  Surgeon: Nicholaus Selinda Birmingham, MD;  Location: [facility];  Service: General;  Laterality: N/A;   NASAL SINUS SURGERY     TUBAL LIGATION     UMBILICAL HERNIA REPAIR N/A 05/18/2018   Procedure: HERNIA REPAIR SUPRA-UMBILICAL ADULT;  Surgeon: Nicholaus Selinda Birmingham, MD;  Location: [facility];  Service: General;  Laterality: N/A;   Patient Active Problem List   Diagnosis Date Noted   Genetic testing 02/14/2024   Malignant neoplasm of upper-outer quadrant of right breast in female, estrogen receptor positive (HCC) 02/01/2024   Symptomatic cholelithiasis    Incisional hernia, without obstruction or  gangrene    Supraumbilical hernia 05/15/2018   Anxiety 03/20/2015   Arthritis 03/20/2015   Benign neoplasm of colon 03/20/2015   Clinical depression 03/20/2015   Malignant neoplasm of corpus uteri, except isthmus (HCC) 03/20/2015   Essential (primary) hypertension 03/20/2015   HLD (hyperlipidemia) 03/20/2015   Cannot sleep 03/20/2015   Osteopenia 03/20/2015   Adult hypothyroidism 03/20/2015    PCP: Dr Bertrum  REFERRING PROVIDER: Dr Melanee MART DIAG: Left shoulder stiffness as well as seroma post lumpectomy.  And reexcision  THERAPY DIAG:  Scar condition and fibrosis of skin  Stiffness of right  shoulder, not elsewhere classified  Rationale for Evaluation and Treatment: Rehabilitation  ONSET DATE: 02/19/24  SUBJECTIVE:                                                                                                                                                                                           SUBJECTIVE STATEMENT: I am doing those exercises on the wall for my shoulder.  My motion is getting better.  My seroma is still hard but just that 1 area. PERTINENT HISTORY:  Patient was diagnosed with right  breast cancer -patient had right lumpectomy on 02/19/2024 by Dr. Cesar.  Patient had positive margins and had a reexcision on 03/13/2024.  11 out of 26 lymph nodes with axillary dissection was positive.  Patient meeting with Dr. Melanee about plan of care next week.  Patient developed a seroma after second incision.  Band-Aid in place reports still some drainage.  PATIENT GOALS:   reduce lymphedema risk and learn post op HEP.   PAIN:  Are you having pain?  No pain reported  PRECAUTIONS: Active CA ; right right upper extremity lymphedema    HAND DOMINANCE: right  WEIGHT BEARING RESTRICTIONS: None  FALLS:  Has patient fallen in last 6 months? No  LIVING ENVIRONMENT: Patient lives with: Alone  OCCUPATION: Patient is retired.  Patient lives alone active in activities around the house.     OBJECTIVE:  COGNITION: Overall cognitive status: Within functional limits for tasks assessed    POSTURE:  Rounded shoulders  UPPER EXTREMITY AROM/PROM:  A/PROM RIGHT   eval  R 04/17/24  Shoulder extension    Shoulder flexion 150 160  Shoulder abduction 150 160  Shoulder internal rotation    Shoulder external rotation 85 90    (Blank rows = not tested)  A/PROM LEFT   eval  Shoulder extension   Shoulder flexion   Shoulder abduction   Shoulder internal rotation   Shoulder external rotation     (Blank rows = not tested)  CERVICAL AROM: All within normal  limits:     UPPER EXTREMITY STRENGTH: Not tested  LYMPHEDEMA ASSESSMENTS:   LYMPHEDEMA/ONCOLOGY QUESTIONNAIRE - 04/22/24 0001       Right Upper Extremity Lymphedema   15 cm Proximal to Olecranon Process 37.4 cm    10 cm Proximal to Olecranon Process 34.5 cm    Olecranon Process 27 cm    15 cm Proximal to Ulnar Styloid Process 25 cm    10 cm Proximal to Ulnar Styloid Process 21 cm           L-DEX LYMPHEDEMA SCREENING:  The patient was assessed using the L-Dex machine today to produce a lymphedema index baseline score. The patient will be reassessed on a regular basis (typically every 3 months) to obtain new L-Dex scores. If the score is > 6.5 points away from his/her baseline score indicating onset of subclinical lymphedema, it will be recommended to wear a compression garment for 4 weeks, 12 hours per day and then be reassessed. If the score continues to be > 6.5 points from baseline at reassessment, we will initiate lymphedema treatment. Assessing in this manner has a 95% rate of preventing clinically significant lymphedema.  Not done this date     PATIENT EDUCATION:  Education details: Lymphedema risk reduction and post op shoulder/posture HEP Person educated: Patient Education method: Explanation, Demonstration, Handout Education comprehension: Patient verbalized understanding and returned demonstration  04/22/24 OT session Patient return after being seen last week.  Patient was performing AAROM for shoulder flexion abduction on the wall and external rotations in supine.  Patient showed great progress in active range of motion.  Patient has 1 area of the seroma above the lumpectomy scar that was drained that still hard with some scar tissue.  Patient continues with compression bra.   Patient's right upper arm circumference increase compared to last week.  Discussed with patient preventative compression sleeve or unilateral postmastectomy Jovi pack.  Patient is going on a cruise the end  of the month.  Patient reluctant.  Wants to try first chip bag and komprex foam to decrease seroma in the fibrosis.  As well as facilitate some lymphatic flow over that area patient can wear at nighttime as well as during the day to tolerance under compression bra.   No pulling and pushing activating the pec against resistance or heavy lifting.  Patient to perform 10 reps to 3 times a day for above HEP for AAROM. Lymphedema education was done in past sessions signs and symptoms as well as precautions and prevention.  Handout from cancer journal provided and reviewed with patient last time.  Patient risk for lymphedema high because of the amount of lymph nodes removed. Also recommend for patient to follow surgeons instructions about maintaining compression over seroma - pt to with increase scar tissue and fibrosis. CLINICAL IMPRESSION: Patient presented OT evaluation after having right lumpectomy on 02/19/2024 by Dr. Cesar.  Had a reexcision on 7/10/17/2023 because of positive margins.  At that time 11 out of 26 axillary lymph nodes was positive.  Patient developed seroma on the right breast needed drainage. Pt performed active assisted range of motion for shoulder flexion, abduction and external rotation since 2 wks ago - patient was educated in signs and symptoms as well as precautions and prevention of lymphedema in past.  Because of high risk for lymphedema on the right upper extremity.  Patient with increased scar tissue because of delayed healing as well as fibrosis. Pt was fitted with Komprex foam and chip bag to decrease  fibrosis and scar tissue.  Patient did not want to try a preventative compression sleeve or Jovi pack.  Patient circumference right upper extremity was increased this date compared to last week.  Patient to use it the next week and a half- patient can benefit from  OT reassessment to determine continues needs and from L-Dex screens every 3 months for 2 years to detect subclinical  lymphedema.  Pt will benefit from skilled therapeutic intervention to improve on the following deficits: Decreased knowledge of precautions and lymphedema education, impaired UE functional use, pain, decreased ROM, postural dysfunction.   OT treatment/interventions: ADL/self-care home management, pt/family education, therapeutic exercise,manual therapy  REHAB POTENTIAL: Good  CLINICAL DECISION MAKING: Stable/uncomplicated  EVALUATION COMPLEXITY: Low   GOALS: Goals reviewed with patient? YES  LONG TERM GOALS: (STG=LTG)    Name Target Date Goal status  1 Pt will be able to verbalize understanding of pertinent lymphedema risk reduction practices relevant to her dx specifically related to skin care.  Baseline:  No knowledge 12 weeks Initial  2 Pt will be able to return demo and/or verbalize understanding of the post op HEP related to regaining shoulder ROM. Baseline:  No knowledge Today Achieved at eval       4 Pt will demo she has regained full shoulder ROM and function post operatively compared to baselines.  Baseline: See objective measurements taken today. 12 weeks Initial    PLAN:  OT FREQUENCY/DURATION: EVAL and12 follow up appointments depending on progress      Occupational Therapy Information for After Breast Cancer Surgery/Treatment:  Lymphedema is a swelling condition that you may be at risk for in your arm if you have lymph nodes removed from the armpit area.  After a sentinel node biopsy, the risk is approximately 5-9% and is higher after an axillary node dissection.  There is treatment available for this condition and it is not life-threatening.  Contact your physician or occupational therapist with concerns. You may begin the 4 shoulder/posture exercises (see additional sheet) when permitted by your physician (typically a week after surgery).  If you have drains, you may need to wait until those are removed before beginning range of motion exercises.  A general  recommendation is to not lift your arms above shoulder height until drains are removed.  These exercises should be done to your tolerance and gently.  This is not a no pain/no gain type of recovery so listen to your body and stretch into the range of motion that you can tolerate, stopping if you have pain.  If you are having immediate reconstruction, ask your plastic surgeon about doing exercises as he or she may want you to wait. .  While undergoing any medical procedure or treatment, try to avoid blood pressure being taken or needle sticks from occurring on the arm on the side of cancer.   This recommendation begins after surgery and continues for the rest of your life.  This may help reduce your risk of getting lymphedema (swelling in your arm). An excellent resource for those seeking information on lymphedema is the National [employer]'s web site. It can be accessed at www.lymphnet.org If you notice swelling in your hand, arm or breast at any time following surgery (even if it is many years from now), please contact your doctor or occupational therapist to discuss this.  Lymphedema can be treated at any time but it is easier for you if it is treated early on.  If you feel like your shoulder motion  is not returning to normal in a reasonable amount of time, please contact your surgeon or occupational therapist.  [facility] [phone]. [address] [place], [place] [address]        Ancel Peters, OTR/L,CLT 04/22/2024, 1:27 PM

## 2024-04-22 NOTE — Progress Notes (Signed)
 START ON PATHWAY REGIMEN - Breast     Cycles 1 through 4: A cycle is every 14 days:     Doxorubicin      Cyclophosphamide      Pegfilgrastim-xxxx    Cycles 5 through 16: A cycle is every 7 days:     Paclitaxel   **Always confirm dose/schedule in your pharmacy ordering system**  Patient Characteristics: Postoperative without Neoadjuvant Therapy, M0 (Pathologic Staging), Invasive Disease, Adjuvant Therapy, HER2 Negative, ER Positive, Node Positive, Node Positive (4+) Therapeutic Status: Postoperative without Neoadjuvant Therapy, M0 (Pathologic Staging) AJCC Grade: G2 AJCC N Category: pN2a AJCC M Category: cM0 ER Status: Positive (+) AJCC 8 Stage Grouping: IB HER2 Status: Negative (-) Oncotype Dx Recurrence Score: Not Appropriate AJCC T Category: pT2 PR Status: Positive (+) Intent of Therapy: Curative Intent, Discussed with Patient
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
## 2024-04-23 ENCOUNTER — Other Ambulatory Visit: Payer: Self-pay

## 2024-04-26 ENCOUNTER — Other Ambulatory Visit: Payer: Self-pay

## 2024-04-26 ENCOUNTER — Telehealth: Payer: Self-pay | Admitting: Oncology

## 2024-04-26 DIAGNOSIS — Z170 Estrogen receptor positive status [ER+]: Secondary | ICD-10-CM

## 2024-04-26 NOTE — Telephone Encounter (Signed)
 Called pt back to follow-up regarding her request to have echo r/s for [place]. Told her that I spoke w/[place], [facility] , and echo scheduler Calla Right) .SABRA Nobody can confirm if [facility] offers echos. Pt put daughter on phone Giles [phone]), and Olam said that she spoke w/someone at [facility] in cardiology who told her that pt could get her echo done at [place] next week. I asked for phone number and she provided [phone] and said to ask for cardiology. Called that number after I got off the phone w/pt and daughter - office had closed for the day. Following back up on Monday. - LH

## 2024-04-29 ENCOUNTER — Inpatient Hospital Stay

## 2024-04-29 DIAGNOSIS — Z170 Estrogen receptor positive status [ER+]: Secondary | ICD-10-CM

## 2024-04-29 NOTE — Progress Notes (Signed)
 [facility] CSW Progress Note   Clinical Social Work introduced self to patient during Patient Education with Raoul Moats, Charity fundraiser.  Provided information regarding CSW role, including counseling, advanced care planning and support group.  Patient stated she already has her ACP completed.  Answered questions as needed.   Follow Up Plan:  CSW will follow-up with patient by phone     Macario CHRISTELLA Au, LCSW Clinical Social Worker [facility]

## 2024-04-30 ENCOUNTER — Other Ambulatory Visit: Payer: Self-pay

## 2024-04-30 ENCOUNTER — Telehealth: Payer: Self-pay | Admitting: Oncology

## 2024-04-30 ENCOUNTER — Inpatient Hospital Stay

## 2024-04-30 DIAGNOSIS — Z170 Estrogen receptor positive status [ER+]: Secondary | ICD-10-CM

## 2024-04-30 NOTE — Progress Notes (Signed)
 [facility] Psychosocial Distress Screening Clinical Social Work  Rebecca Patton is a 78 y.o. year old female. Clinical Social Work was referred by nurse navigator for positive distress screening. The patient scored a 5 on the Psychosocial Distress Thermometer which indicates moderate distress. Clinical Social Worker contacted patient by phone to assess for distress and other psychosocial needs. Patient reports feeling overwhelmed with information after chemo education.    Distress Screen:    04/29/2024    4:23 PM  ONCBCN DISTRESS SCREENING  Screening Type Initial Screening  How much distress have you been experiencing in the past week? (0-10) 5  Practical concerns type Finances  Emotional concerns type Worry or anxiety;Sadness or depression;Fear  Physical Concerns Type  Sleep     Interventions: Patient interviewed and appropriate assessments performed: brief mental health assessment   CSW and patient discussed common feeling and emotions when being diagnosed with cancer, and the importance of support during treatment.  CSW informed patient of the support team and support services at [facility].  CSW provided contact information and encouraged patient to call with any questions or concerns.   Follow Up Plan: CSW will follow-up with patient by phone  Patient verbalizes understanding of plan: Yes    Macario HERO Chelsee Hosie, LCSW

## 2024-04-30 NOTE — Telephone Encounter (Signed)
 Call from [facility] - After all of the work that has been done, they can't use our order because Melanee isn't a [facility] [address]. Mimi have another Dr putting in the order and then they will schedule the echo with the pt. Kiley from their clinic is taking this over. - LH

## 2024-05-01 ENCOUNTER — Ambulatory Visit (HOSPITAL_COMMUNITY)

## 2024-05-02 ENCOUNTER — Encounter
Admission: RE | Admit: 2024-05-02 | Discharge: 2024-05-02 | Disposition: A | Discharge status: Home / Self Care | Source: Ambulatory Visit | Attending: General Surgery | Admitting: General Surgery

## 2024-05-02 ENCOUNTER — Ambulatory Visit: Payer: Self-pay | Admitting: General Surgery

## 2024-05-02 ENCOUNTER — Other Ambulatory Visit: Payer: Self-pay

## 2024-05-02 ENCOUNTER — Ambulatory Visit: Admitting: Occupational Therapy

## 2024-05-02 DIAGNOSIS — Z01812 Encounter for preprocedural laboratory examination: Secondary | ICD-10-CM | POA: Insufficient documentation

## 2024-05-02 DIAGNOSIS — Z79899 Other long term (current) drug therapy: Secondary | ICD-10-CM | POA: Insufficient documentation

## 2024-05-02 DIAGNOSIS — M25611 Stiffness of right shoulder, not elsewhere classified: Secondary | ICD-10-CM

## 2024-05-02 DIAGNOSIS — I10 Essential (primary) hypertension: Secondary | ICD-10-CM | POA: Insufficient documentation

## 2024-05-02 DIAGNOSIS — L905 Scar conditions and fibrosis of skin: Secondary | ICD-10-CM | POA: Diagnosis not present

## 2024-05-02 HISTORY — DX: Personal history of urinary calculi: Z87.442

## 2024-05-02 LAB — CBC
HCT: 41.4 % (ref 36.0–46.0)
Hemoglobin: 13.3 g/dL (ref 12.0–15.0)
MCH: 29 pg (ref 26.0–34.0)
MCHC: 32.1 g/dL (ref 30.0–36.0)
MCV: 90.2 fL (ref 80.0–100.0)
Platelets: 260 K/uL (ref 150–400)
RBC: 4.59 MIL/uL (ref 3.87–5.11)
RDW: 13.7 % (ref 11.5–15.5)
WBC: 9 K/uL (ref 4.0–10.5)
nRBC: 0 % (ref 0.0–0.2)

## 2024-05-02 LAB — BASIC METABOLIC PANEL WITH GFR
Anion gap: 13 (ref 5–15)
BUN: 26 mg/dL — ABNORMAL HIGH (ref 8–23)
CO2: 25 mmol/L (ref 22–32)
Calcium: 9.6 mg/dL (ref 8.9–10.3)
Chloride: 101 mmol/L (ref 98–111)
Creatinine, Ser: 0.98 mg/dL (ref 0.44–1.00)
GFR, Estimated: 60 mL/min — ABNORMAL LOW (ref >60)
Glucose, Bld: 108 mg/dL — ABNORMAL HIGH (ref 70–99)
Potassium: 3.8 mmol/L (ref 3.5–5.1)
Sodium: 139 mmol/L (ref 135–145)

## 2024-05-02 NOTE — Therapy (Signed)
 OUTPATIENT OCCUPATIONAL THERAPY BREAST CANCER POSTOP TREATMENT   Patient Name: Rebecca Patton MRN: [id number] DOB:10-29-1945, 78 y.o., female Today's Date: 05/02/2024  END OF SESSION:  OT End of Session - 05/02/24 1516     Visit Number 4    Number of Visits 12    Date for OT Re-Evaluation 07/03/24    OT Start Time 1446    OT Stop Time 1516    OT Time Calculation (min) 30 min    Activity Tolerance Patient tolerated treatment well    Behavior During Therapy [facility] for tasks assessed/performed          Past Medical History:  Diagnosis Date   Anxiety    Arthritis    COVID-19 09/2020   Depression    GERD (gastroesophageal reflux disease)    High blood pressure    History of kidney stones    seen on PET scan   Hyperlipidemia    Hypothyroidism    Malignant neoplasm of upper-outer quadrant of right breast, estrogen receptor positive (HCC)    MRSA infection 2006   on nose   Pneumonia 10/13/2023   Pre-diabetes    Supraumbilical hernia    Symptomatic cholelithiasis    Uterine cancer (HCC) 2009   Past Surgical History:  Procedure Laterality Date   ABDOMINAL HYSTERECTOMY  09/13/2007   BSO   AXILLARY LYMPH NODE DISSECTION Right 03/13/2024   Procedure: REDGIE HARD;  Surgeon: Rodolph Romano, MD;  Location: [facility];  Service: General;  Laterality: Right;   AXILLARY SENTINEL NODE BIOPSY Right 02/19/2024   Procedure: BIOPSY, LYMPH NODE, SENTINEL, AXILLARY;  Surgeon: Rodolph Romano, MD;  Location: [facility];  Service: General;  Laterality: Right;   BREAST BIOPSY Right 01/24/2024   US  RT BREAST BX W LOC DEV EA ADD LESION IMG BX SPEC US  GUIDE 01/24/2024 [facility]   BREAST BIOPSY Right 01/24/2024   US  RT BREAST BX W LOC DEV 1ST LESION IMG BX SPEC US  GUIDE 01/24/2024 [facility]   BREAST BIOPSY Right 01/24/2024   US  RT BREAST BX W LOC DEV EA ADD LESION IMG BX SPEC US  GUIDE 01/24/2024 [facility]   BREAST BIOPSY Right 02/19/2024   MM RT PLC BREAST LOC  DEV   1ST LESION  INC MAMMO GUIDE 02/19/2024 [facility]   BREAST LUMPECTOMY Right 02/19/2024   Procedure: BREAST LUMPECTOMY;  Surgeon: Rodolph Romano, MD;  Location: [facility];  Service: General;  Laterality: Right;  with needle localization   BREAST LUMPECTOMY Right 03/13/2024   Procedure: BREAST LUMPECTOMY;  Surgeon: Rodolph Romano, MD;  Location: [facility];  Service: General;  Laterality: Right;   BUNIONECTOMY     CHOLECYSTECTOMY     COLONOSCOPY WITH PROPOFOL  N/A 01/23/2020   Procedure: COLONOSCOPY WITH PROPOFOL ;  Surgeon: Toledo, Ladell POUR, MD;  Location: [facility];  Service: Gastroenterology;  Laterality: N/A;   EXCISION NEUROMA     INSERTION OF MESH N/A 05/18/2018   Procedure: INSERTION OF MESH;  Surgeon: Nicholaus Selinda Birmingham, MD;  Location: [facility];  Service: General;  Laterality: N/A;   NASAL SINUS SURGERY     TUBAL LIGATION     UMBILICAL HERNIA REPAIR N/A 05/18/2018   Procedure: HERNIA REPAIR SUPRA-UMBILICAL ADULT;  Surgeon: Nicholaus Selinda Birmingham, MD;  Location: [facility];  Service: General;  Laterality: N/A;   Patient Active Problem List   Diagnosis Date Noted   Genetic testing 02/14/2024   Malignant neoplasm of upper-outer quadrant of right breast in female, estrogen receptor positive (HCC) 02/01/2024   Symptomatic cholelithiasis  Incisional hernia, without obstruction or gangrene    Supraumbilical hernia 05/15/2018   Anxiety 03/20/2015   Arthritis 03/20/2015   Benign neoplasm of colon 03/20/2015   Clinical depression 03/20/2015   Malignant neoplasm of corpus uteri, except isthmus (HCC) 03/20/2015   Essential (primary) hypertension 03/20/2015   HLD (hyperlipidemia) 03/20/2015   Cannot sleep 03/20/2015   Osteopenia 03/20/2015   Adult hypothyroidism 03/20/2015    PCP: Dr Bertrum  REFERRING PROVIDER: Dr Melanee MART DIAG: Left shoulder stiffness as well as seroma post lumpectomy.  And reexcision  THERAPY DIAG:  Scar condition and fibrosis of  skin  Stiffness of right shoulder, not elsewhere classified  Rationale for Evaluation and Treatment: Rehabilitation  ONSET DATE: 02/19/24  SUBJECTIVE:                                                                                                                                                                                           SUBJECTIVE STATEMENT: I am doing those exercises on the wall for my shoulder.  My motion is getting better.  My seroma is still hard but just that 1 area. PERTINENT HISTORY:  Patient was diagnosed with right  breast cancer -patient had right lumpectomy on 02/19/2024 by Dr. Cesar.  Patient had positive margins and had a reexcision on 03/13/2024.  11 out of 26 lymph nodes with axillary dissection was positive.  Patient meeting with Dr. Melanee about plan of care next week.  Patient developed a seroma after second incision.  Band-Aid in place reports still some drainage.  PATIENT GOALS:   reduce lymphedema risk and learn post op HEP.   PAIN:  Are you having pain?  No pain reported  PRECAUTIONS: Active CA ; right right upper extremity lymphedema    HAND DOMINANCE: right  WEIGHT BEARING RESTRICTIONS: None  FALLS:  Has patient fallen in last 6 months? No  LIVING ENVIRONMENT: Patient lives with: Alone  OCCUPATION: Patient is retired.  Patient lives alone active in activities around the house.     OBJECTIVE:  COGNITION: Overall cognitive status: Within functional limits for tasks assessed    POSTURE:  Rounded shoulders  UPPER EXTREMITY AROM/PROM:  A/PROM RIGHT   eval  R 04/17/24 R 05/02/24  Shoulder extension     Shoulder flexion 150 160 180  Shoulder abduction 150 160 180  Shoulder internal rotation     Shoulder external rotation 85 90 90    (Blank rows = not tested)  A/PROM LEFT   eval  Shoulder extension   Shoulder flexion   Shoulder abduction   Shoulder internal rotation   Shoulder external rotation     (  Blank rows = not  tested)  CERVICAL AROM: All within normal limits:    UPPER EXTREMITY STRENGTH: Not tested  LYMPHEDEMA ASSESSMENTS:   LYMPHEDEMA/ONCOLOGY QUESTIONNAIRE - 05/02/24 0001       Right Upper Extremity Lymphedema   15 cm Proximal to Olecranon Process 37.2 cm    10 cm Proximal to Olecranon Process 33.5 cm    Olecranon Process 29 cm    15 cm Proximal to Ulnar Styloid Process 24.8 cm    10 cm Proximal to Ulnar Styloid Process 21 cm    Just Proximal to Ulnar Styloid Process 15.3 cm      Left Upper Extremity Lymphedema   15 cm Proximal to Olecranon Process 36 cm    10 cm Proximal to Olecranon Process 32 cm    Olecranon Process 27 cm    15 cm Proximal to Ulnar Styloid Process 25 cm    10 cm Proximal to Ulnar Styloid Process 21 cm           L-DEX LYMPHEDEMA SCREENING:  The patient was assessed using the L-Dex machine today to produce a lymphedema index baseline score. The patient will be reassessed on a regular basis (typically every 3 months) to obtain new L-Dex scores. If the score is > 6.5 points away from his/her baseline score indicating onset of subclinical lymphedema, it will be recommended to wear a compression garment for 4 weeks, 12 hours per day and then be reassessed. If the score continues to be > 6.5 points from baseline at reassessment, we will initiate lymphedema treatment. Assessing in this manner has a 95% rate of preventing clinically significant lymphedema.  Not done this date     PATIENT EDUCATION:  Education details: Lymphedema risk reduction and post op shoulder/posture HEP Person educated: Patient Education method: Explanation, Demonstration, Handout Education comprehension: Patient verbalized understanding and returned demonstration  04/22/24 OT session Patient return after being seen last week.  Patient was performing AAROM for shoulder flexion abduction on the wall and external rotations in supine.  Patient showed great progress in active range of motion.   Patient has 1 area of the seroma above the lumpectomy scar that was drained that still hard with some scar tissue.  Patient continues with compression bra.   Patient's right upper arm circumference increase compared to last week.  Discussed with patient preventative compression sleeve or unilateral postmastectomy Jovi pack.  Patient is going on a cruise the end of the month.  Patient reluctant.  Wants to try first chip bag and komprex foam to decrease seroma in the fibrosis.  As well as facilitate some lymphatic flow over that area patient can wear at nighttime as well as during the day to tolerance under compression bra.   No pulling and pushing activating the pec against resistance or heavy lifting.  Patient to perform 10 reps to 3 times a day for above HEP for AAROM. Lymphedema education was done in past sessions signs and symptoms as well as precautions and prevention.  Handout from cancer journal provided and reviewed with patient last time.  Patient risk for lymphedema high because of the amount of lymph nodes removed. Also recommend for patient to follow surgeons instructions about maintaining compression over seroma - pt to with increase scar tissue and fibrosis.   05/02/24 session:  Patient return after being seen last week.  Patient was performing AAROM for shoulder flexion abduction on the wall and external rotations in supine.  Patient showed great progress in active range of  motion.  AROM in R shoulder WNL and strength 5/5 in all planes.  Patient has 1 area of the seroma above the lumpectomy scar that was drained that still hard with some scar tissue. Pt improved compare to last week after using chip bag and komprex foam to decrease seroma and the fibrosis.  As well as facilitate some lymphatic flow over that area patient  wore it at nighttime as well as during the day to tolerance under compression bra.  Patient continues with compression bra.   Patient's right upper arm circumference increased  last week - this week she maintained upper arm but elbow increased -  Discussed with patient preventative compression sleeve or unilateral postmastectomy Jovi pack.  Patient is going on a cruise tomorrow - will reassess on 3rd of Sept when she is back.  Lymphedema education was review again with pt scar massage and soft tissue mobs - night time and am - also some MLD over R to L AAA and R AI 10 reps - upper arm and rework Am and Pm - 2 x day .  CLINICAL IMPRESSION: Patient presented OT evaluation after having right lumpectomy on 02/19/2024 by Dr. Cesar.  Had a reexcision on 7/10/17/2023 because of positive margins.  At that time 11 out of 26 axillary lymph nodes was positive.  Patient developed seroma on the right breast needed drainage. Pt performed active assisted range of motion for shoulder flexion, abduction and external rotation since 3 wks ago - WIth great progress- AROM and strength WNL - patient was educated in signs and symptoms as well as precautions and prevention of lymphedema in past.  Because of high risk for lymphedema on the right upper extremity.  Patient with increased scar tissue because of delayed healing as well as fibrosis. Pt was fitted last with Komprex foam and chip bag to decrease fibrosis and scar tissue.  Great progress -area smaller -and pt to cont wear it after her cruise-  Patient did not want to try a preventative compression sleeve or Jovi pack last week.  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 Pt to cont with modified MLD this week and will recheck when she is back from cruise- patient can benefit from  OT reassessment to determine continues needs and from L-Dex screens every 3 months for 2 years to detect subclinical lymphedema.  Pt will benefit from skilled therapeutic intervention to improve on the following deficits: Decreased knowledge of precautions and lymphedema education, impaired UE functional use,  pain, decreased ROM, postural dysfunction.   OT treatment/interventions: ADL/self-care home management, pt/family education, therapeutic exercise,manual therapy  REHAB POTENTIAL: Good  CLINICAL DECISION MAKING: Stable/uncomplicated  EVALUATION COMPLEXITY: Low   GOALS: Goals reviewed with patient? YES  LONG TERM GOALS: (STG=LTG)    Name Target Date Goal status  1 Pt will be able to verbalize understanding of pertinent lymphedema risk reduction practices relevant to her dx specifically related to skin care.  Baseline:  No knowledge 12 weeks Initial  2 Pt will be able to return demo and/or verbalize understanding of the post op HEP related to regaining shoulder ROM. Baseline:  No knowledge Today Achieved at eval       4 Pt will demo she has regained full shoulder ROM and function post operatively compared to baselines.  Baseline: See objective measurements taken today. 12 weeks Initial    PLAN:  OT FREQUENCY/DURATION: EVAL and12 follow up appointments depending on progress  Occupational Therapy Information for After Breast Cancer Surgery/Treatment:  Lymphedema is a swelling condition that you may be at risk for in your arm if you have lymph nodes removed from the armpit area.  After a sentinel node biopsy, the risk is approximately 5-9% and is higher after an axillary node dissection.  There is treatment available for this condition and it is not life-threatening.  Contact your physician or occupational therapist with concerns. You may begin the 4 shoulder/posture exercises (see additional sheet) when permitted by your physician (typically a week after surgery).  If you have drains, you may need to wait until those are removed before beginning range of motion exercises.  A general recommendation is to not lift your arms above shoulder height until drains are removed.  These exercises should be done to your tolerance and gently.  This is not a no pain/no gain type of recovery so  listen to your body and stretch into the range of motion that you can tolerate, stopping if you have pain.  If you are having immediate reconstruction, ask your plastic surgeon about doing exercises as he or she may want you to wait. .  While undergoing any medical procedure or treatment, try to avoid blood pressure being taken or needle sticks from occurring on the arm on the side of cancer.   This recommendation begins after surgery and continues for the rest of your life.  This may help reduce your risk of getting lymphedema (swelling in your arm). An excellent resource for those seeking information on lymphedema is the National Lymphedema Network's web site. It can be accessed at www.lymphnet.org If you notice swelling in your hand, arm or breast at any time following surgery (even if it is many years from now), please contact your doctor or occupational therapist to discuss this.  Lymphedema can be treated at any time but it is easier for you if it is treated early on.  If you feel like your shoulder motion is not returning to normal in a reasonable amount of time, please contact your surgeon or occupational therapist.  [facility] [phone]. [address] [place], [place] [address]        Ancel Peters, OTR/L,CLT 05/02/2024, 3:18 PM

## 2024-05-02 NOTE — Patient Instructions (Addendum)
 Your procedure is scheduled on: 05/14/24 - Tuesday Report to the Registration Desk on the 1st floor of the [facility]. To find out your arrival time, please call [phone] between 1PM - 3PM on: 05/13/24 - Monday If your arrival time is 6:00 am, do not arrive before that time as the [facility] entrance doors do not open until 6:00 am.  REMEMBER: Instructions that are not followed completely may result in serious medical risk, up to and including death; or upon the discretion of your surgeon and anesthesiologist your surgery may need to be rescheduled.  Do not eat food or drink any liquids after midnight the night before surgery.  No gum chewing or hard candies.  One week prior to surgery: Stop Anti-inflammatories (NSAIDS) such as Advil , Aleve, Ibuprofen , Motrin , Naproxen, Naprosyn and Aspirin based products such as Excedrin, Goody's Powder, BC Powder.  Stop ANY OVER THE COUNTER supplements until after surgery (Ashwagandha, Collagen, Garlic,FRUIT & VEGETABLE DAILY , Multivitamin, Red Yeast Rice, Vitamin B12)   You may continue to take Tylenol  if needed for pain up until the day of surgery.   Stop 81 mg Aspirin 5 days prior to surgery- Beginning 05/09/24.  Continue taking all of your other prescription medications up until the day of surgery.   ON THE DAY OF SURGERY ONLY TAKE THESE MEDICATIONS WITH SIPS OF WATER :  -levothyroxine  (SYNTHROID )  -metoprolol  succinate (TOPROL -XL) -omeprazole  (PRILOSEC)  -celecoxib (CELEBREX)  - fluticasone  (FLONASE )    No Alcohol for 24 hours before or after surgery.  No Smoking including e-cigarettes for 24 hours before surgery.  No chewable tobacco products for at least 6 hours before surgery.  No nicotine patches on the day of surgery.  Do not use any recreational drugs for at least a week (preferably 2 weeks) before your surgery.  Please be advised that the combination of cocaine and anesthesia may have negative outcomes, up to and  including death. If you test positive for cocaine, your surgery will be cancelled.  On the morning of surgery brush your teeth with toothpaste and water , you may rinse your mouth with mouthwash if you wish. Do not swallow any toothpaste or mouthwash.  Use CHG Soap or wipes as directed on instruction sheet.  Do not wear jewelry, make-up, hairpins, clips or nail polish.  For welded (permanent) jewelry: bracelets, anklets, waist bands, etc.  Please have this removed prior to surgery.  If it is not removed, there is a chance that hospital personnel will need to cut it off on the day of surgery.  Do not wear lotions, powders, or perfumes.   Do not shave body hair from the neck down 48 hours before surgery.  Contact lenses, hearing aids and dentures may not be worn into surgery.  Do not bring valuables to the hospital. [facility] is not responsible for any missing/lost belongings or valuables.   Notify your doctor if there is any change in your medical condition (cold, fever, infection).  Wear comfortable clothing (specific to your surgery type) to the hospital.  After surgery, you can help prevent lung complications by doing breathing exercises.  Take deep breaths and cough every 1-2 hours. Your doctor may order a device called an Incentive Spirometer to help you take deep breaths. When coughing or sneezing, hold a pillow firmly against your incision with both hands. This is called "splinting." Doing this helps protect your incision. It also decreases belly discomfort.  If you are being admitted to the hospital overnight, leave your suitcase  in the car. After surgery it may be brought to your room.  In case of increased patient census, it may be necessary for you, the patient, to continue your postoperative care in the Same Day Surgery department.  If you are being discharged the day of surgery, you will not be allowed to drive home. You will need a responsible individual to drive you  home and stay with you for 24 hours after surgery.   If you are taking public transportation, you will need to have a responsible individual with you.  Please call the Pre-admissions Testing Dept. at [phone] if you have any questions about these instructions.  Surgery Visitation Policy:  Patients having surgery or a procedure may have two visitors.  Children under the age of 47 must have an adult with them who is not the patient.  Inpatient Visitation:    Visiting hours are 7 a.m. to 8 p.m. Up to four visitors are allowed at one time in a patient room. The visitors may rotate out with other people during the day.  One visitor age 76 or older may stay with the patient overnight and must be in the room by 8 p.m.   Merchandiser, retail to address health-related social needs:  [email or web address]

## 2024-05-06 ENCOUNTER — Telehealth: Payer: Self-pay

## 2024-05-06 NOTE — Telephone Encounter (Signed)
 Clinical Social Work attempted to contact patient by phone to follow up.  Left voicemail with contact information and request for return call.

## 2024-05-07 ENCOUNTER — Encounter: Payer: Self-pay | Admitting: Oncology

## 2024-05-10 ENCOUNTER — Ambulatory Visit: Admitting: Oncology

## 2024-05-14 ENCOUNTER — Other Ambulatory Visit: Payer: Self-pay

## 2024-05-14 ENCOUNTER — Encounter
Admission: RE | Disposition: A | Discharge status: Home / Self Care | Payer: Self-pay | Source: Home / Self Care | Attending: General Surgery

## 2024-05-14 ENCOUNTER — Ambulatory Visit

## 2024-05-14 ENCOUNTER — Ambulatory Visit
Admission: RE | Admit: 2024-05-14 | Discharge: 2024-05-14 | Disposition: A | Discharge status: Home / Self Care | Attending: General Surgery | Admitting: General Surgery

## 2024-05-14 ENCOUNTER — Ambulatory Visit: Payer: Self-pay | Admitting: Urgent Care

## 2024-05-14 ENCOUNTER — Ambulatory Visit: Admitting: Anesthesiology

## 2024-05-14 ENCOUNTER — Encounter: Payer: Self-pay | Admitting: General Surgery

## 2024-05-14 DIAGNOSIS — C50911 Malignant neoplasm of unspecified site of right female breast: Secondary | ICD-10-CM | POA: Diagnosis present

## 2024-05-14 DIAGNOSIS — I10 Essential (primary) hypertension: Secondary | ICD-10-CM | POA: Insufficient documentation

## 2024-05-14 DIAGNOSIS — Z87891 Personal history of nicotine dependence: Secondary | ICD-10-CM | POA: Diagnosis not present

## 2024-05-14 DIAGNOSIS — Z452 Encounter for adjustment and management of vascular access device: Secondary | ICD-10-CM | POA: Diagnosis not present

## 2024-05-14 DIAGNOSIS — C779 Secondary and unspecified malignant neoplasm of lymph node, unspecified: Secondary | ICD-10-CM | POA: Diagnosis not present

## 2024-05-14 DIAGNOSIS — K219 Gastro-esophageal reflux disease without esophagitis: Secondary | ICD-10-CM | POA: Diagnosis not present

## 2024-05-14 DIAGNOSIS — Z8551 Personal history of malignant neoplasm of bladder: Secondary | ICD-10-CM | POA: Insufficient documentation

## 2024-05-14 DIAGNOSIS — Z170 Estrogen receptor positive status [ER+]: Secondary | ICD-10-CM | POA: Diagnosis not present

## 2024-05-14 DIAGNOSIS — C50411 Malignant neoplasm of upper-outer quadrant of right female breast: Secondary | ICD-10-CM | POA: Diagnosis not present

## 2024-05-14 HISTORY — PX: PORTACATH PLACEMENT: SHX2246

## 2024-05-14 SURGERY — INSERTION, TUNNELED CENTRAL VENOUS DEVICE, WITH PORT
Anesthesia: General | Site: Chest | Laterality: Left

## 2024-05-14 MED ORDER — ORAL CARE MOUTH RINSE: 15.0000 mL | Freq: Once | OROMUCOSAL | Status: AC

## 2024-05-14 MED ORDER — CHLORHEXIDINE GLUCONATE 0.12 % MT SOLN
OROMUCOSAL | Status: AC
  Filled 2024-05-14: qty 15

## 2024-05-14 MED ORDER — FENTANYL CITRATE (PF) 100 MCG/2ML IJ SOLN
INTRAMUSCULAR | Status: DC | PRN
  Administered 2024-05-14 (×2): 25 ug via INTRAVENOUS

## 2024-05-14 MED ORDER — DEXMEDETOMIDINE HCL IN NACL 80 MCG/20ML IV SOLN
INTRAVENOUS | Status: DC | PRN
  Administered 2024-05-14 (×2): 4 ug via INTRAVENOUS

## 2024-05-14 MED ORDER — EPHEDRINE SULFATE-NACL 50-0.9 MG/10ML-% IV SOSY
PREFILLED_SYRINGE | INTRAVENOUS | Status: DC | PRN
Start: 2024-05-14 — End: 2024-05-14
  Administered 2024-05-14: 5 mg via INTRAVENOUS

## 2024-05-14 MED ORDER — DEXAMETHASONE SODIUM PHOSPHATE 10 MG/ML IJ SOLN
INTRAMUSCULAR | Status: DC | PRN
  Administered 2024-05-14: 10 mg via INTRAVENOUS

## 2024-05-14 MED ORDER — OXYCODONE HCL 5 MG PO TABS: 5.0000 mg | ORAL_TABLET | Freq: Once | ORAL | Status: DC | PRN

## 2024-05-14 MED ORDER — FENTANYL CITRATE (PF) 100 MCG/2ML IJ SOLN: 25.0000 ug | INTRAMUSCULAR | Status: DC | PRN

## 2024-05-14 MED ORDER — FENTANYL CITRATE (PF) 100 MCG/2ML IJ SOLN
INTRAMUSCULAR | Status: AC
Start: 2024-05-14 — End: 2024-05-14
  Filled 2024-05-14: qty 2

## 2024-05-14 MED ORDER — PROPOFOL 1000 MG/100ML IV EMUL
INTRAVENOUS | Status: AC
  Filled 2024-05-14: qty 100

## 2024-05-14 MED ORDER — PROPOFOL 500 MG/50ML IV EMUL
INTRAVENOUS | Status: DC | PRN
  Administered 2024-05-14: 100 ug/kg/min via INTRAVENOUS

## 2024-05-14 MED ORDER — EPHEDRINE 5 MG/ML INJ
INTRAVENOUS | Status: AC
  Filled 2024-05-14: qty 5

## 2024-05-14 MED ORDER — OXYCODONE HCL 5 MG/5ML PO SOLN: 5.0000 mg | Freq: Once | ORAL | Status: DC | PRN

## 2024-05-14 MED ORDER — CEFAZOLIN SODIUM-DEXTROSE 2-4 GM/100ML-% IV SOLN
INTRAVENOUS | Status: AC
  Filled 2024-05-14: qty 100

## 2024-05-14 MED ORDER — BUPIVACAINE-EPINEPHRINE (PF) 0.25% -1:200000 IJ SOLN
INTRAMUSCULAR | Status: DC | PRN
  Administered 2024-05-14: 15 mL via PERINEURAL

## 2024-05-14 MED ORDER — HEPARIN SODIUM (PORCINE) 5000 UNIT/ML IJ SOLN
INTRAMUSCULAR | Status: AC
  Filled 2024-05-14: qty 1

## 2024-05-14 MED ORDER — LIDOCAINE HCL (PF) 2 % IJ SOLN
INTRAMUSCULAR | Status: AC
  Filled 2024-05-14: qty 5

## 2024-05-14 MED ORDER — CEFAZOLIN SODIUM-DEXTROSE 2-4 GM/100ML-% IV SOLN
2.0000 g | INTRAVENOUS | Status: AC
  Administered 2024-05-14: 2 g via INTRAVENOUS

## 2024-05-14 MED ORDER — SODIUM CHLORIDE (PF) 0.9 % IJ SOLN
INTRAMUSCULAR | Status: AC
  Filled 2024-05-14: qty 50

## 2024-05-14 MED ORDER — DEXAMETHASONE SODIUM PHOSPHATE 10 MG/ML IJ SOLN
INTRAMUSCULAR | Status: AC
  Filled 2024-05-14: qty 1

## 2024-05-14 MED ORDER — SODIUM CHLORIDE 0.9 % IV SOLN
INTRAVENOUS | Status: DC | PRN
  Administered 2024-05-14: 9 mL via INTRAMUSCULAR

## 2024-05-14 MED ORDER — LACTATED RINGERS IV SOLN: INTRAVENOUS | Status: DC

## 2024-05-14 MED ORDER — LIDOCAINE HCL (CARDIAC) PF 100 MG/5ML IV SOSY
PREFILLED_SYRINGE | INTRAVENOUS | Status: DC | PRN
  Administered 2024-05-14: 40 mg via INTRAVENOUS

## 2024-05-14 MED ORDER — BUPIVACAINE-EPINEPHRINE (PF) 0.25% -1:200000 IJ SOLN
INTRAMUSCULAR | Status: AC
  Filled 2024-05-14: qty 30

## 2024-05-14 MED ORDER — SODIUM CHLORIDE (PF) 0.9 % IJ SOLN
INTRAMUSCULAR | Status: DC | PRN
Start: 2024-05-14 — End: 2024-05-14
  Administered 2024-05-14: 50 mL

## 2024-05-14 MED ORDER — CHLORHEXIDINE GLUCONATE 0.12 % MT SOLN
15.0000 mL | Freq: Once | OROMUCOSAL | Status: AC
  Administered 2024-05-14: 15 mL via OROMUCOSAL

## 2024-05-14 SURGICAL SUPPLY — 27 items
BAG DECANTER FOR FLEXI CONT (MISCELLANEOUS) ×1 IMPLANT
BLADE SURG 15 STRL LF DISP TIS (BLADE) ×1 IMPLANT
BLADE SURG SZ11 CARB STEEL (BLADE) ×1 IMPLANT
CLAMP SUTURE YELLOW 5 PAIRS (MISCELLANEOUS) ×1 IMPLANT
DERMABOND ADVANCED .7 DNX12 (GAUZE/BANDAGES/DRESSINGS) ×1 IMPLANT
DRAPE C-ARM XRAY 36X54 (DRAPES) ×1 IMPLANT
ELECTRODE REM PT RTRN 9FT ADLT (ELECTROSURGICAL) ×1 IMPLANT
GLOVE BIO SURGEON STRL SZ 6.5 (GLOVE) ×1 IMPLANT
GLOVE BIOGEL PI IND STRL 6.5 (GLOVE) ×1 IMPLANT
GLOVE SURG SYN 6.5 PF PI (GLOVE) ×2 IMPLANT
GOWN STRL REUS W/ TWL LRG LVL3 (GOWN DISPOSABLE) ×3 IMPLANT
IV NS 500ML BAXH (IV SOLUTION) ×1 IMPLANT
KIT PORT INFUSION SMART 8FR (Port) ×1 IMPLANT
KIT TURNOVER KIT A (KITS) ×1 IMPLANT
LABEL OR SOLS (LABEL) ×1 IMPLANT
MANIFOLD NEPTUNE II (INSTRUMENTS) ×1 IMPLANT
NDL FILTER BLUNT 18X1 1/2 (NEEDLE) ×1 IMPLANT
NEEDLE FILTER BLUNT 18X1 1/2 (NEEDLE) ×1 IMPLANT
PACK PORT-A-CATH (MISCELLANEOUS) ×1 IMPLANT
SUT MNCRL AB 4-0 PS2 18 (SUTURE) ×1 IMPLANT
SUT PROLENE 2 0 FS (SUTURE) ×1 IMPLANT
SUT VIC AB 2-0 SH 27XBRD (SUTURE) ×1 IMPLANT
SUT VIC AB 3-0 SH 27X BRD (SUTURE) ×1 IMPLANT
SYR 10ML LL (SYRINGE) ×2 IMPLANT
SYR 3ML LL SCALE MARK (SYRINGE) ×1 IMPLANT
TRAP FLUID SMOKE EVACUATOR (MISCELLANEOUS) ×1 IMPLANT
WATER STERILE IRR 500ML POUR (IV SOLUTION) ×1 IMPLANT

## 2024-05-14 NOTE — Op Note (Signed)
 SURGICAL PROCEDURE REPORT  DATE OF PROCEDURE: 05/14/2024   SURGEON: Dr. 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 access for chemotherapy   POST-OPERATIVE DIAGNOSIS: Same  PROCEDURE(S): (cpt: 36561) 1.) Percutaneous access of Left internal jugular vein under ultrasound guidance 2.) Insertion of tunneled Left internal jugular central venous catheter with subcutaneous port  INTRAOPERATIVE FINDINGS: Patent easily compressible Left internal jugular vein with appropriate respiratory variations and well-secured tunneled central venous catheter with subcutaneous port at completion of the procedure  ESTIMATED BLOOD LOSS: Minimal (<20 mL)   SPECIMENS: None   IMPLANTS: 12F tunneled Bard PowerPort central venous catheter with subcutaneous port  DRAINS: None   COMPLICATIONS: None apparent   CONDITION AT COMPLETION: Hemodynamically stable, awake   DISPOSITION: PACU   INDICATION(S) FOR PROCEDURE:  Patient is a 78 y.o. 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 All risks, benefits, and alternatives to above elective procedures were discussed with the patient, who elected to proceed, and informed consent was accordingly obtained at that time.  DETAILS OF PROCEDURE:  Patient was brought to the operative suite and appropriately identified. In Trendelenburg position, Left IJ venous access site was prepped and draped in the usual sterile fashion, and following a brief timeout, percutaneous Left IJ venous access was obtained under ultrasound guidance using Seldinger technique, by which local anesthetic was injected over the Left IJ vein, and access needle was inserted under direct ultrasound visualization into the Left IJ vein, through which soft guidewire was advanced, over which access  needle was withdrawn. Guidewire was secured, attention was directed to injection of local anesthetic along the planned tunnel site, 2-3 cm transverse Left chest incision was made and confirmed to accommodate the subcutaneous port, and flushed catheter was tunneled retrograde from the port site over the Left chest to the Left IJ access site with the attached port well-secured to the catheter and within the subcutaneous pocket. Insertion sheath was advanced over the guidewire, which was withdrawn along with the insertion sheath dilator. The catheter was introduced through the sheath and left on the [place] junction under fluoro guidance and catheter cut to desire lenght. Catheter connected to port and fixed to the pocket on two side to avoid twisting. Port was confirmed to withdraw blood and flush easily, after which concentrated heparin  was instilled into the port and catheter. Dermis at the subcutaneous pocket was re-approximated using buried interrupted 3-0 Vicryl suture, and 4-0 Monocryl suture was used to re-approximate skin at the insertion and subcutaneous port sites in running subcuticular fashion for the subcutaneous port and buried interrupted fashion for the insertion site. Skin was cleaned, dried, and sterile skin glue was applied. Patient was then safely transferred to PACU for a chest x-ray. Ultrasound images are available on paper chart and Fluoroscopy guidance images are available in Epic.

## 2024-05-14 NOTE — Progress Notes (Signed)
 Per Dr. Rodolph patient is good to go, xray completed and okay. Aspirin to be resumed tomorrow, informed patient and daughter, Rebecca Patton.

## 2024-05-14 NOTE — H&P (Signed)
 History of Present Illness Rebecca Patton is a 78 y.o. female with with history of invasive bladder carcinoma measuring 5 x 4 x 6 cm.  He was also found with 11 out of 26 lymph nodes positive for metastatic breast cancer.  She was evaluated by medical oncology and decision was to proceed with chemotherapy.  Patient comes today for insertion of Port-A-Cath.  She denies any new complaints.  Past Medical History Past Medical History:  Diagnosis Date   Anxiety    Arthritis    COVID-19 09/2020   Depression    GERD (gastroesophageal reflux disease)    High blood pressure    History of kidney stones    seen on PET scan   Hyperlipidemia    Hypothyroidism    Malignant neoplasm of upper-outer quadrant of right breast, estrogen receptor positive (HCC)    MRSA infection 2006   on nose   Pneumonia 10/13/2023   Pre-diabetes    Supraumbilical hernia    Symptomatic cholelithiasis    Uterine cancer (HCC) 2009       Past Surgical History:  Procedure Laterality Date   ABDOMINAL HYSTERECTOMY  09/13/2007   BSO   AXILLARY LYMPH NODE DISSECTION Right 03/13/2024   Procedure: REDGIE HARD;  Surgeon: Rodolph Romano, MD;  Location: [facility];  Service: General;  Laterality: Right;   AXILLARY SENTINEL NODE BIOPSY Right 02/19/2024   Procedure: BIOPSY, LYMPH NODE, SENTINEL, AXILLARY;  Surgeon: Rodolph Romano, MD;  Location: [facility];  Service: General;  Laterality: Right;   BREAST BIOPSY Right 01/24/2024   US  RT BREAST BX W LOC DEV EA ADD LESION IMG BX SPEC US  GUIDE 01/24/2024 [facility]   BREAST BIOPSY Right 01/24/2024   US  RT BREAST BX W LOC DEV 1ST LESION IMG BX SPEC US  GUIDE 01/24/2024 [facility]   BREAST BIOPSY Right 01/24/2024   US  RT BREAST BX W LOC DEV EA ADD LESION IMG BX SPEC US  GUIDE 01/24/2024 [facility]   BREAST BIOPSY Right 02/19/2024   MM RT PLC BREAST LOC DEV   1ST LESION  INC MAMMO GUIDE 02/19/2024 [facility]   BREAST LUMPECTOMY Right 02/19/2024    Procedure: BREAST LUMPECTOMY;  Surgeon: Rodolph Romano, MD;  Location: [facility];  Service: General;  Laterality: Right;  with needle localization   BREAST LUMPECTOMY Right 03/13/2024   Procedure: BREAST LUMPECTOMY;  Surgeon: Rodolph Romano, MD;  Location: [facility];  Service: General;  Laterality: Right;   BUNIONECTOMY     CHOLECYSTECTOMY     COLONOSCOPY WITH PROPOFOL  N/A 01/23/2020   Procedure: COLONOSCOPY WITH PROPOFOL ;  Surgeon: Toledo, Ladell POUR, MD;  Location: [facility];  Service: Gastroenterology;  Laterality: N/A;   EXCISION NEUROMA     INSERTION OF MESH N/A 05/18/2018   Procedure: INSERTION OF MESH;  Surgeon: Nicholaus Selinda Birmingham, MD;  Location: [facility];  Service: General;  Laterality: N/A;   NASAL SINUS SURGERY     TUBAL LIGATION     UMBILICAL HERNIA REPAIR N/A 05/18/2018   Procedure: HERNIA REPAIR SUPRA-UMBILICAL ADULT;  Surgeon: Nicholaus Selinda Birmingham, MD;  Location: [facility];  Service: General;  Laterality: N/A;    No Known Allergies  Current Facility-Administered Medications  Medication Dose Route Frequency Provider Last Rate Last Admin   ceFAZolin  (ANCEF ) IVPB 2g/100 mL premix  2 g Intravenous On Call to OR Rodolph Romano, MD       chlorhexidine  (PERIDEX ) 0.12 % solution 15 mL  15 mL Mouth/Throat Once Karenz, Andrew, MD       Or  Oral care mouth rinse  15 mL Mouth Rinse Once Karenz, Andrew, MD       lactated ringers  infusion   Intravenous Continuous Dario Barter, MD        Family History Family History  Problem Relation Age of Onset   Parkinson's disease Mother    Diabetes Father    CAD Father    Cancer Sister        squamous cell cancer in her eye   Cancer Brother    Pancreatic cancer Brother    Tremor Brother    Breast cancer Neg Hx        Social History Social History   Tobacco Use   Smoking status: Former    Current packs/day: 0.00    Average packs/day: 0.5 packs/day for 25.0 years (12.5 ttl pk-yrs)    Types: Cigarettes    Start  date: 09/11/1972    Quit date: 09/11/1997    Years since quitting: 26.6   Smokeless tobacco: Never  Vaping Use   Vaping status: Never Used  Substance Use Topics   Alcohol use: No    Alcohol/week: 0.0 standard drinks of alcohol   Drug use: No       ROS Full ROS of systems performed and is otherwise negative there than what is stated in the HPI  Physical Exam Blood pressure (!) 165/84, pulse 68, temperature 97.9 F (36.6 C), resp. rate 17, SpO2 100%.  CONSTITUTIONAL: Alert, oriented x 3 RESPIRATORY:  Lungs are clear, and breath sounds are equal bilaterally. Normal respiratory effort without pathologic use of accessory muscles. CARDIOVASCULAR: Heart is regular without murmurs, gallops, or rubs. GI: The abdomen is soft, nontender, and nondistended. There were no palpable masses. There was no hepatosplenomegaly. There were normal bowel sounds. GU: Deferred MUSCULOSKELETAL:  Normal muscle strength and tone in all four extremities.    SKIN: Skin turgor is normal. There are no pathologic skin lesions.  NEUROLOGIC:  Motor and sensation is grossly normal.  Cranial nerves are grossly intact. PSYCH:  Alert and oriented to person, place and time. Affect is normal.  I have personally reviewed the patient's imaging and medical records.    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  Plan    Insertion of Port-A-Cath to start chemotherapy  Lucas Sjogren, MD  Lucas Sjogren 05/14/2024, 6:55 AM

## 2024-05-14 NOTE — Transfer of Care (Signed)
 Immediate Anesthesia Transfer of Care Note  Patient: Rebecca Patton  Procedure(s) Performed: INSERTION, TUNNELED CENTRAL VENOUS DEVICE, WITH PORT (Left: Chest)  Patient Location: PACU  Anesthesia Type:General  Level of Consciousness: drowsy  Airway & Oxygen Therapy: Patient Spontanous Breathing and Patient connected to face mask oxygen  Post-op Assessment: Report given to RN and Post -op Vital signs reviewed and stable  Post vital signs: Reviewed and stable  Last Vitals:  Vitals Value Taken Time  BP 125/48 05/14/24 08:20  Temp 35.8 0820  Pulse 68 05/14/24 08:23  Resp 13 05/14/24 08:23  SpO2 99 % 05/14/24 08:23  Vitals shown include unfiled device data.  Last Pain:  Vitals:   05/14/24 0645  PainSc: 0-No pain         Complications: No notable events documented.

## 2024-05-14 NOTE — Anesthesia Postprocedure Evaluation (Signed)
 Anesthesia Post Note  Patient: Rebecca Patton  Procedure(s) Performed: INSERTION, TUNNELED CENTRAL VENOUS DEVICE, WITH PORT (Left: Chest)  Patient location during evaluation: PACU Anesthesia Type: General Level of consciousness: awake and alert Pain management: pain level controlled Vital Signs Assessment: post-procedure vital signs reviewed and stable Respiratory status: spontaneous breathing, nonlabored ventilation and respiratory function stable Cardiovascular status: blood pressure returned to baseline and stable Postop Assessment: no apparent nausea or vomiting Anesthetic complications: no   No notable events documented.   Last Vitals:  Vitals:   05/14/24 0845 05/14/24 0910  BP: (!) 140/50 (!) 157/67  Pulse: 65 66  Resp: 20 16  Temp:  (!) 36.4 C  SpO2: 99% 99%    Last Pain:  Vitals:   05/14/24 0910  TempSrc: Temporal  PainSc: 0-No pain                 Fairy POUR Daniil Labarge

## 2024-05-14 NOTE — Discharge Instructions (Addendum)
  Diet: Resume home heart healthy regular diet.   Activity: Increase activity as tolerated, but light activity and walking are encouraged. Do not drive or drink alcohol if taking narcotic pain medications.  Wound care: May shower with soapy water  and pat dry (do not rub incisions), but no baths or submerging incision underwater until follow-up. (no swimming)   Medications: Resume all home medications. For mild to moderate pain: acetaminophen  (Tylenol ) or ibuprofen  (if no kidney disease). Combining Tylenol  with alcohol can substantially increase your risk of causing liver disease.   Call office [phone]) at any time if any questions, worsening pain, fevers/chills, bleeding, drainage from incision site, or other concerns.

## 2024-05-14 NOTE — Anesthesia Preprocedure Evaluation (Signed)
 Anesthesia Evaluation  Patient identified by MRN, date of birth, ID band Patient awake    Reviewed: Allergy & Precautions, NPO status , Patient's Chart, lab work & pertinent test results  History of Anesthesia Complications Negative for: history of anesthetic complications  Airway Mallampati: III  TM Distance: <3 FB Neck ROM: full    Dental  (+) Chipped   Pulmonary neg pulmonary ROS, neg COPD, former smoker   Pulmonary exam normal        Cardiovascular Exercise Tolerance: Good hypertension, (-) angina (-) Past MI Normal cardiovascular exam     Neuro/Psych  PSYCHIATRIC DISORDERS      negative neurological ROS     GI/Hepatic Neg liver ROS,GERD  Controlled,,  Endo/Other  negative endocrine ROS    Renal/GU negative Renal ROS  negative genitourinary   Musculoskeletal   Abdominal   Peds  Hematology negative hematology ROS (+)   Anesthesia Other Findings Past Medical History: No date: Anxiety No date: Arthritis 09/2020: COVID-19 No date: Depression No date: GERD (gastroesophageal reflux disease) No date: High blood pressure No date: History of kidney stones     Comment:  seen on PET scan No date: Hyperlipidemia No date: Hypothyroidism No date: Malignant neoplasm of upper-outer quadrant of right breast,  estrogen receptor positive (HCC) 2006: MRSA infection     Comment:  on nose 10/13/2023: Pneumonia No date: Pre-diabetes No date: Supraumbilical hernia No date: Symptomatic cholelithiasis 2009: Uterine cancer (HCC)  Past Surgical History: 09/13/2007: ABDOMINAL HYSTERECTOMY     Comment:  BSO 03/13/2024: AXILLARY LYMPH NODE DISSECTION; Right     Comment:  Procedure: LYMPHADENECTOMY, AXILLARY;  Surgeon:               Rodolph Romano, MD;  Location: [facility];  Service:              General;  Laterality: Right; 02/19/2024: AXILLARY SENTINEL NODE BIOPSY; Right     Comment:  Procedure: BIOPSY, LYMPH NODE,  SENTINEL, AXILLARY;                Surgeon: Rodolph Romano, MD;  Location: [facility];               Service: General;  Laterality: Right; 01/24/2024: BREAST BIOPSY; Right     Comment:  US  RT BREAST BX W LOC DEV EA ADD LESION IMG BX SPEC US                GUIDE 01/24/2024 [facility] 01/24/2024: BREAST BIOPSY; Right     Comment:  US  RT BREAST BX W LOC DEV 1ST LESION IMG BX SPEC US                GUIDE 01/24/2024 [facility] 01/24/2024: BREAST BIOPSY; Right     Comment:  US  RT BREAST BX W LOC DEV EA ADD LESION IMG BX SPEC US                GUIDE 01/24/2024 [facility] 02/19/2024: BREAST BIOPSY; Right     Comment:  MM RT PLC BREAST LOC DEV   1ST LESION  INC MAMMO GUIDE               02/19/2024 [facility] 02/19/2024: BREAST LUMPECTOMY; Right     Comment:  Procedure: BREAST LUMPECTOMY;  Surgeon: Rodolph Romano, MD;  Location: [facility];  Service: General;                Laterality: Right;  with needle localization 03/13/2024: BREAST LUMPECTOMY; Right     Comment:  Procedure: BREAST LUMPECTOMY;  Surgeon: Rodolph Romano, MD;  Location: [facility];  Service: General;                Laterality: Right; No date: BUNIONECTOMY No date: CHOLECYSTECTOMY 01/23/2020: COLONOSCOPY WITH PROPOFOL ; N/A     Comment:  Procedure: COLONOSCOPY WITH PROPOFOL ;  Surgeon: Toledo,               Ladell POUR, MD;  Location: [facility];  Service:               Gastroenterology;  Laterality: N/A; No date: EXCISION NEUROMA 05/18/2018: INSERTION OF MESH; N/A     Comment:  Procedure: INSERTION OF MESH;  Surgeon: Nicholaus Selinda Birmingham, MD;  Location: [facility];  Service: General;                Laterality: N/A; No date: NASAL SINUS SURGERY No date: TUBAL LIGATION 05/18/2018: UMBILICAL HERNIA REPAIR; N/A     Comment:  Procedure: HERNIA REPAIR SUPRA-UMBILICAL ADULT;                Surgeon: Nicholaus Selinda Birmingham, MD;  Location: [facility];                Service:  General;  Laterality: N/A;     Reproductive/Obstetrics negative OB ROS                              Anesthesia Physical Anesthesia Plan  ASA: 2  Anesthesia Plan: General   Post-op Pain Management:    Induction: Intravenous  PONV Risk Score and Plan: Propofol  infusion and TIVA  Airway Management Planned: Natural Airway and Nasal Cannula  Additional Equipment:   Intra-op Plan:   Post-operative Plan:   Informed Consent: I have reviewed the patients History and Physical, chart, labs and discussed the procedure including the risks, benefits and alternatives for the proposed anesthesia with the patient or authorized representative who has indicated his/her understanding and acceptance.     Dental Advisory Given  Plan Discussed with: Anesthesiologist, CRNA and Surgeon  Anesthesia Plan Comments: (Patient consented for risks of anesthesia including but not limited to:  - adverse reactions to medications - risk of airway placement if required - damage to eyes, teeth, lips or other oral mucosa - nerve damage due to positioning  - sore throat or hoarseness - Damage to heart, brain, nerves, lungs, other parts of body or loss of life  Patient voiced understanding and assent.)        Anesthesia Quick Evaluation

## 2024-05-15 ENCOUNTER — Inpatient Hospital Stay

## 2024-05-15 ENCOUNTER — Inpatient Hospital Stay: Attending: Oncology

## 2024-05-15 ENCOUNTER — Ambulatory Visit: Attending: Oncology | Admitting: Occupational Therapy

## 2024-05-15 ENCOUNTER — Inpatient Hospital Stay: Admitting: Oncology

## 2024-05-15 ENCOUNTER — Encounter: Payer: Self-pay | Admitting: Oncology

## 2024-05-15 ENCOUNTER — Encounter: Payer: Self-pay | Admitting: *Deleted

## 2024-05-15 VITALS — BP 133/63 | HR 62

## 2024-05-15 VITALS — BP 153/78 | HR 76 | Temp 98.5°F | Resp 16 | Wt 213.0 lb

## 2024-05-15 DIAGNOSIS — C50411 Malignant neoplasm of upper-outer quadrant of right female breast: Secondary | ICD-10-CM | POA: Diagnosis not present

## 2024-05-15 DIAGNOSIS — M25611 Stiffness of right shoulder, not elsewhere classified: Secondary | ICD-10-CM | POA: Insufficient documentation

## 2024-05-15 DIAGNOSIS — M898X9 Other specified disorders of bone, unspecified site: Secondary | ICD-10-CM | POA: Diagnosis not present

## 2024-05-15 DIAGNOSIS — R350 Frequency of micturition: Secondary | ICD-10-CM | POA: Diagnosis not present

## 2024-05-15 DIAGNOSIS — Z452 Encounter for adjustment and management of vascular access device: Secondary | ICD-10-CM | POA: Insufficient documentation

## 2024-05-15 DIAGNOSIS — G8929 Other chronic pain: Secondary | ICD-10-CM | POA: Diagnosis not present

## 2024-05-15 DIAGNOSIS — R443 Hallucinations, unspecified: Secondary | ICD-10-CM | POA: Diagnosis not present

## 2024-05-15 DIAGNOSIS — L905 Scar conditions and fibrosis of skin: Secondary | ICD-10-CM | POA: Insufficient documentation

## 2024-05-15 DIAGNOSIS — Z5111 Encounter for antineoplastic chemotherapy: Secondary | ICD-10-CM | POA: Diagnosis not present

## 2024-05-15 DIAGNOSIS — M545 Low back pain, unspecified: Secondary | ICD-10-CM | POA: Diagnosis not present

## 2024-05-15 DIAGNOSIS — Z5189 Encounter for other specified aftercare: Secondary | ICD-10-CM | POA: Diagnosis not present

## 2024-05-15 DIAGNOSIS — E039 Hypothyroidism, unspecified: Secondary | ICD-10-CM | POA: Diagnosis not present

## 2024-05-15 DIAGNOSIS — J069 Acute upper respiratory infection, unspecified: Secondary | ICD-10-CM | POA: Diagnosis not present

## 2024-05-15 DIAGNOSIS — Z170 Estrogen receptor positive status [ER+]: Secondary | ICD-10-CM

## 2024-05-15 LAB — CBC WITH DIFFERENTIAL (CANCER CENTER ONLY)
Abs Immature Granulocytes: 0.23 K/uL — ABNORMAL HIGH (ref 0.00–0.07)
Basophils Absolute: 0 K/uL (ref 0.0–0.1)
Basophils Relative: 0 %
Eosinophils Absolute: 0 K/uL (ref 0.0–0.5)
Eosinophils Relative: 0 %
HCT: 37.4 % (ref 36.0–46.0)
Hemoglobin: 12.2 g/dL (ref 12.0–15.0)
Immature Granulocytes: 1 %
Lymphocytes Relative: 7 %
Lymphs Abs: 1.4 K/uL (ref 0.7–4.0)
MCH: 29.3 pg (ref 26.0–34.0)
MCHC: 32.6 g/dL (ref 30.0–36.0)
MCV: 89.9 fL (ref 80.0–100.0)
Monocytes Absolute: 1.5 K/uL — ABNORMAL HIGH (ref 0.1–1.0)
Monocytes Relative: 8 %
Neutro Abs: 15.8 K/uL — ABNORMAL HIGH (ref 1.7–7.7)
Neutrophils Relative %: 84 %
Platelet Count: 206 K/uL (ref 150–400)
RBC: 4.16 MIL/uL (ref 3.87–5.11)
RDW: 14.2 % (ref 11.5–15.5)
WBC Count: 18.9 K/uL — ABNORMAL HIGH (ref 4.0–10.5)
nRBC: 0 % (ref 0.0–0.2)

## 2024-05-15 LAB — CMP (CANCER CENTER ONLY)
ALT: 17 U/L (ref 0–44)
AST: 26 U/L (ref 15–41)
Albumin: 3.7 g/dL (ref 3.5–5.0)
Alkaline Phosphatase: 81 U/L (ref 38–126)
Anion gap: 9 (ref 5–15)
BUN: 22 mg/dL (ref 8–23)
CO2: 25 mmol/L (ref 22–32)
Calcium: 8.8 mg/dL — ABNORMAL LOW (ref 8.9–10.3)
Chloride: 103 mmol/L (ref 98–111)
Creatinine: 0.72 mg/dL (ref 0.44–1.00)
GFR, Estimated: >60 mL/min (ref >60)
Glucose, Bld: 125 mg/dL — ABNORMAL HIGH (ref 70–99)
Potassium: 3.4 mmol/L — ABNORMAL LOW (ref 3.5–5.1)
Sodium: 137 mmol/L (ref 135–145)
Total Bilirubin: 0.5 mg/dL (ref 0.0–1.2)
Total Protein: 6.8 g/dL (ref 6.5–8.1)

## 2024-05-15 MED ORDER — APREPITANT 130 MG/18ML IV EMUL
130.0000 mg | Freq: Once | INTRAVENOUS | Status: AC
  Administered 2024-05-15: 130 mg via INTRAVENOUS
  Filled 2024-05-15: qty 18

## 2024-05-15 MED ORDER — SODIUM CHLORIDE 0.9 % IV SOLN
600.0000 mg/m2 | Freq: Once | INTRAVENOUS | Status: AC
  Administered 2024-05-15: 1260 mg via INTRAVENOUS
  Filled 2024-05-15: qty 63

## 2024-05-15 MED ORDER — DEXAMETHASONE SODIUM PHOSPHATE 10 MG/ML IJ SOLN
10.0000 mg | Freq: Once | INTRAMUSCULAR | Status: AC
  Administered 2024-05-15: 10 mg via INTRAVENOUS
  Filled 2024-05-15: qty 1

## 2024-05-15 MED ORDER — PALONOSETRON HCL INJECTION 0.25 MG/5ML
0.2500 mg | Freq: Once | INTRAVENOUS | Status: AC
  Administered 2024-05-15: 0.25 mg via INTRAVENOUS
  Filled 2024-05-15: qty 5

## 2024-05-15 MED ORDER — DOXORUBICIN HCL CHEMO IV INJECTION 2 MG/ML
60.0000 mg/m2 | Freq: Once | INTRAVENOUS | Status: AC
  Administered 2024-05-15: 126 mg via INTRAVENOUS
  Filled 2024-05-15: qty 63

## 2024-05-15 MED ORDER — SODIUM CHLORIDE 0.9 % IV SOLN
INTRAVENOUS | Status: DC
  Filled 2024-05-15: qty 250

## 2024-05-15 NOTE — Therapy (Signed)
 OUTPATIENT OCCUPATIONAL THERAPY BREAST CANCER POSTOP TREATMENT   Patient Name: Rebecca Patton MRN: [id number] DOB:Jan 21, 1946, 78 y.o., female Today's Date: 05/15/2024  END OF SESSION:  OT End of Session - 05/15/24 1604     Visit Number 5    Number of Visits 12    Date for OT Re-Evaluation 07/03/24    OT Start Time 1150    OT Stop Time 1218    OT Time Calculation (min) 28 min    Activity Tolerance Patient tolerated treatment well    Behavior During Therapy [facility] for tasks assessed/performed          Past Medical History:  Diagnosis Date   Anxiety    Arthritis    COVID-19 09/2020   Depression    GERD (gastroesophageal reflux disease)    High blood pressure    History of kidney stones    seen on PET scan   Hyperlipidemia    Hypothyroidism    Malignant neoplasm of upper-outer quadrant of right breast, estrogen receptor positive (HCC)    MRSA infection 2006   on nose   Pneumonia 10/13/2023   Pre-diabetes    Supraumbilical hernia    Symptomatic cholelithiasis    Uterine cancer (HCC) 2009   Past Surgical History:  Procedure Laterality Date   ABDOMINAL HYSTERECTOMY  09/13/2007   BSO   AXILLARY LYMPH NODE DISSECTION Right 03/13/2024   Procedure: REDGIE HARD;  Surgeon: Rodolph Romano, MD;  Location: [facility];  Service: General;  Laterality: Right;   AXILLARY SENTINEL NODE BIOPSY Right 02/19/2024   Procedure: BIOPSY, LYMPH NODE, SENTINEL, AXILLARY;  Surgeon: Rodolph Romano, MD;  Location: [facility];  Service: General;  Laterality: Right;   BREAST BIOPSY Right 01/24/2024   US  RT BREAST BX W LOC DEV EA ADD LESION IMG BX SPEC US  GUIDE 01/24/2024 [facility]   BREAST BIOPSY Right 01/24/2024   US  RT BREAST BX W LOC DEV 1ST LESION IMG BX SPEC US  GUIDE 01/24/2024 [facility]   BREAST BIOPSY Right 01/24/2024   US  RT BREAST BX W LOC DEV EA ADD LESION IMG BX SPEC US  GUIDE 01/24/2024 [facility]   BREAST BIOPSY Right 02/19/2024   MM RT PLC BREAST LOC  DEV   1ST LESION  INC MAMMO GUIDE 02/19/2024 [facility]   BREAST LUMPECTOMY Right 02/19/2024   Procedure: BREAST LUMPECTOMY;  Surgeon: Rodolph Romano, MD;  Location: [facility];  Service: General;  Laterality: Right;  with needle localization   BREAST LUMPECTOMY Right 03/13/2024   Procedure: BREAST LUMPECTOMY;  Surgeon: Rodolph Romano, MD;  Location: [facility];  Service: General;  Laterality: Right;   BUNIONECTOMY     CHOLECYSTECTOMY     COLONOSCOPY WITH PROPOFOL  N/A 01/23/2020   Procedure: COLONOSCOPY WITH PROPOFOL ;  Surgeon: Toledo, Ladell POUR, MD;  Location: [facility];  Service: Gastroenterology;  Laterality: N/A;   EXCISION NEUROMA     INSERTION OF MESH N/A 05/18/2018   Procedure: INSERTION OF MESH;  Surgeon: Nicholaus Selinda Birmingham, MD;  Location: [facility];  Service: General;  Laterality: N/A;   NASAL SINUS SURGERY     PORTACATH PLACEMENT Left 05/14/2024   Procedure: INSERTION, TUNNELED CENTRAL VENOUS DEVICE, WITH PORT;  Surgeon: Rodolph Romano, MD;  Location: [facility];  Service: General;  Laterality: Left;   TUBAL LIGATION     UMBILICAL HERNIA REPAIR N/A 05/18/2018   Procedure: HERNIA REPAIR SUPRA-UMBILICAL ADULT;  Surgeon: Nicholaus Selinda Birmingham, MD;  Location: [facility];  Service: General;  Laterality: N/A;   Patient Active Problem List  Diagnosis Date Noted   Genetic testing 02/14/2024   Malignant neoplasm of upper-outer quadrant of right breast in female, estrogen receptor positive (HCC) 02/01/2024   Symptomatic cholelithiasis    Incisional hernia, without obstruction or gangrene    Supraumbilical hernia 05/15/2018   Anxiety 03/20/2015   Arthritis 03/20/2015   Benign neoplasm of colon 03/20/2015   Clinical depression 03/20/2015   Malignant neoplasm of corpus uteri, except isthmus (HCC) 03/20/2015   Essential (primary) hypertension 03/20/2015   HLD (hyperlipidemia) 03/20/2015   Cannot sleep 03/20/2015   Osteopenia 03/20/2015   Adult hypothyroidism 03/20/2015     PCP: Dr Bertrum  REFERRING PROVIDER: Dr Melanee MART DIAG: Left shoulder stiffness as well as seroma post lumpectomy.  And reexcision  THERAPY DIAG:  Scar condition and fibrosis of skin  Stiffness of right shoulder, not elsewhere classified  Rationale for Evaluation and Treatment: Rehabilitation  ONSET DATE: 02/19/24  SUBJECTIVE:                                                                                                                                                                                           SUBJECTIVE STATEMENT: The cruise was so good.  We drove down.  I did do my massaging for my lymphatics.  And I also did the range of motion exercises in the wall.  In that small area that was still hard with the seroma was softer and smaller.  The Chemo-Port was not that bad PERTINENT HISTORY:  Patient was diagnosed with right  breast cancer -patient had right lumpectomy on 02/19/2024 by Dr. Cesar.  Patient had positive margins and had a reexcision on 03/13/2024.  11 out of 26 lymph nodes with axillary dissection was positive.  Patient meeting with Dr. Melanee about plan of care next week.  Patient developed a seroma after second incision.  Band-Aid in place reports still some drainage.  PATIENT GOALS:   reduce lymphedema risk and learn post op HEP.   PAIN:  Are you having pain?  No pain reported  PRECAUTIONS: Active CA ; right right upper extremity lymphedema    HAND DOMINANCE: right  WEIGHT BEARING RESTRICTIONS: None  FALLS:  Has patient fallen in last 6 months? No  LIVING ENVIRONMENT: Patient lives with: Alone  OCCUPATION: Patient is retired.  Patient lives alone active in activities around the house.     OBJECTIVE:  COGNITION: Overall cognitive status: Within functional limits for tasks assessed    POSTURE:  Rounded shoulders  UPPER EXTREMITY AROM/PROM:  A/PROM RIGHT   eval  R 04/17/24 R 05/02/24  Shoulder extension     Shoulder flexion 150 160  180  Shoulder abduction 150 160 180  Shoulder internal rotation     Shoulder external rotation 85 90 90    (Blank rows = not tested)  A/PROM LEFT   eval  Shoulder extension   Shoulder flexion   Shoulder abduction   Shoulder internal rotation   Shoulder external rotation     (Blank rows = not tested)  CERVICAL AROM: All within normal limits:    UPPER EXTREMITY STRENGTH: Not tested  LYMPHEDEMA ASSESSMENTS:   LYMPHEDEMA/ONCOLOGY QUESTIONNAIRE - 05/15/24 0001       Right Upper Extremity Lymphedema   15 cm Proximal to Olecranon Process 36.5 cm    10 cm Proximal to Olecranon Process 33.4 cm    Olecranon Process 28 cm    15 cm Proximal to Ulnar Styloid Process 24.5 cm    10 cm Proximal to Ulnar Styloid Process 20 cm    Just Proximal to Ulnar Styloid Process 14.8 cm      Left Upper Extremity Lymphedema   15 cm Proximal to Olecranon Process 35.7 cm    10 cm Proximal to Olecranon Process 32.5 cm    Olecranon Process 26.5 cm    15 cm Proximal to Ulnar Styloid Process 25.8 cm    10 cm Proximal to Ulnar Styloid Process 20 cm           L-DEX LYMPHEDEMA SCREENING:  The patient was assessed using the L-Dex machine today to produce a lymphedema index baseline score. The patient will be reassessed on a regular basis (typically every 3 months) to obtain new L-Dex scores. If the score is > 6.5 points away from his/her baseline score indicating onset of subclinical lymphedema, it will be recommended to wear a compression garment for 4 weeks, 12 hours per day and then be reassessed. If the score continues to be > 6.5 points from baseline at reassessment, we will initiate lymphedema treatment. Assessing in this manner has a 95% rate of preventing clinically significant lymphedema.   L-DEX FLOWSHEETS - 05/15/24 1600       L-DEX LYMPHEDEMA SCREENING   Measurement Type Unilateral    L-DEX MEASUREMENT EXTREMITY Upper Extremity    POSITION  Standing    DOMINANT SIDE Right    At Risk Side  Right    BASELINE SCORE (UNILATERAL) 3.3    L-DEX SCORE (UNILATERAL) 0.8    VALUE CHANGE (UNILAT) -2.5        Within normal range compared to preop.     PATIENT EDUCATION:  Education details: Lymphedema risk reduction and post op shoulder/posture HEP Person educated: Patient Education method: Explanation, Demonstration, Handout Education comprehension: Patient verbalized understanding and returned demonstration    05/02/24 session:  Patient return after being seen last week.  Patient was performing AAROM for shoulder flexion abduction on the wall and external rotations in supine.  Patient showed great progress in active range of motion.  AROM in R shoulder WNL and strength 5/5 in all planes.  Patient has 1 area of the seroma above the lumpectomy scar that was drained that still hard with some scar tissue. Pt improved compare to last week after using chip bag and komprex foam to decrease seroma and the fibrosis.  As well as facilitate some lymphatic flow over that area patient  wore it at nighttime as well as during the day to tolerance under compression bra.  Patient continues with compression bra.   Patient's right upper arm circumference increased last week - this week she maintained upper arm  but elbow increased -  Discussed with patient preventative compression sleeve or unilateral postmastectomy Jovi pack.  Patient is going on a cruise tomorrow - will reassess on 3rd of Sept when she is back.  Lymphedema education was review again with pt scar massage and soft tissue mobs - night time and am - also some MLD over R to L AAA and R AI 10 reps - upper arm and rework Am and Pm - 2 x day .  05/15/24:  Patient was seen today in the infusion room.  Circumference measurement was taken of bilateral upper extremity.  Patient return after being seen l about 2 weeks ago.  Patient was on a cruise.  Had a Chemo-Port placed this week.  And started chemotherapy today.  Patient reports while on a cruise  she was performing AAROM for shoulder flexion abduction on the wall and external rotations in supine.  Patient's active range of motion  R shoulder WNL and strength 5/5 in all planes. Patient report the area with a seroma was drained is much smaller and not as hard.  Encourage patient if needed to continue with chip bag and komprex foam to decrease seroma and the fibrosis.  Patient's circumference in bilateral upper extremities was assessed and compare and is within normal limits.  Right measurements decreased.  And within normal limits when compared to the left.  Patient is at high risk for lymphedema.  Reviewed with patient and daughter again lymphedema signs and symptoms and prevention.  Patient has a handout.  Patient  can continue with MLD- night time and am - also some MLD over R to L AAA and R AI 10 reps - upper arm and rework Am and Pm - 2 x day . After infusion patient came down to treatment room and SOZO was done for her L-Dex score.  Patient within normal range.  CLINICAL IMPRESSION: Patient presented OT evaluation after having right lumpectomy on 02/19/2024 by Dr. Cesar.  Had a reexcision on 7/10/17/2023 because of positive margins.  At that time 11 out of 26 axillary lymph nodes was positive.  Patient developed seroma on the right breast needed drainage.  Patient bilateral shoulder active range of motion strength within normal limits.  Patient return after being on a cruise for a week or 2.  Patient started chemo today.  Patient report the scar tissue around her seroma is improving and getting smaller and softer.  Patient can continue to use Komprex foam and chip bag to decrease fibrosis and scar tissue as needed.  Patient's bilateral circumferences of upper extremities within normal range right compared to the left.  Improved since last time.  Also done her L-Dex score that was in within normal range this date.  Pt can cont with modified MLD throughout her treatment-reviewed with patient and  daughter again signs and symptoms as well as prevention for lymphedema.- patient follow-up prior to radiation with breast navigator to repeat her L-Dex score.  And if within normal range can repeated again after radiation.  Is within normal range then can continue with  L-Dex screens every 3 months for 2 years to detect subclinical lymphedema.  Pt will benefit from skilled therapeutic intervention to improve on the following deficits: Decreased knowledge of precautions and lymphedema education, impaired UE functional use, pain, decreased ROM, postural dysfunction.   OT treatment/interventions: ADL/self-care home management, pt/family education, therapeutic exercise,manual therapy  REHAB POTENTIAL: Good  CLINICAL DECISION MAKING: Stable/uncomplicated  EVALUATION COMPLEXITY: Low   GOALS: Goals reviewed with  patient? YES  LONG TERM GOALS: (STG=LTG)    Name Target Date Goal status  1 Pt will be able to verbalize understanding of pertinent lymphedema risk reduction practices relevant to her dx specifically related to skin care.  Baseline:  No knowledge 12 weeks Initial  2 Pt will be able to return demo and/or verbalize understanding of the post op HEP related to regaining shoulder ROM. Baseline:  No knowledge Today Achieved at eval       4 Pt will demo she has regained full shoulder ROM and function post operatively compared to baselines.  Baseline: See objective measurements taken today. 12 weeks Initial    PLAN:  OT FREQUENCY/DURATION: EVAL and12 follow up appointments depending on progress      Occupational Therapy Information for After Breast Cancer Surgery/Treatment:  Lymphedema is a swelling condition that you may be at risk for in your arm if you have lymph nodes removed from the armpit area.  After a sentinel node biopsy, the risk is approximately 5-9% and is higher after an axillary node dissection.  There is treatment available for this condition and it is not  life-threatening.  Contact your physician or occupational therapist with concerns. You may begin the 4 shoulder/posture exercises (see additional sheet) when permitted by your physician (typically a week after surgery).  If you have drains, you may need to wait until those are removed before beginning range of motion exercises.  A general recommendation is to not lift your arms above shoulder height until drains are removed.  These exercises should be done to your tolerance and gently.  This is not a no pain/no gain type of recovery so listen to your body and stretch into the range of motion that you can tolerate, stopping if you have pain.  If you are having immediate reconstruction, ask your plastic surgeon about doing exercises as he or she may want you to wait. .  While undergoing any medical procedure or treatment, try to avoid blood pressure being taken or needle sticks from occurring on the arm on the side of cancer.   This recommendation begins after surgery and continues for the rest of your life.  This may help reduce your risk of getting lymphedema (swelling in your arm). An excellent resource for those seeking information on lymphedema is the National [employer]'s web site. It can be accessed at www.lymphnet.org If you notice swelling in your hand, arm or breast at any time following surgery (even if it is many years from now), please contact your doctor or occupational therapist to discuss this.  Lymphedema can be treated at any time but it is easier for you if it is treated early on.  If you feel like your shoulder motion is not returning to normal in a reasonable amount of time, please contact your surgeon or occupational therapist.  [facility] [phone]. [address] [place], [place] [address]        Ancel Peters, OTR/L,CLT 05/15/2024, 4:08 PM
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
## 2024-05-15 NOTE — Patient Instructions (Signed)

## 2024-05-16 ENCOUNTER — Other Ambulatory Visit: Payer: Self-pay

## 2024-05-16 ENCOUNTER — Telehealth: Payer: Self-pay

## 2024-05-16 NOTE — Telephone Encounter (Signed)
 Telephone call to patient for follow up after receiving first infusion yesterday.   No answer and mail box is full and cannot accept any messages.

## 2024-05-17 ENCOUNTER — Inpatient Hospital Stay

## 2024-05-17 DIAGNOSIS — C50411 Malignant neoplasm of upper-outer quadrant of right female breast: Secondary | ICD-10-CM

## 2024-05-17 DIAGNOSIS — Z5111 Encounter for antineoplastic chemotherapy: Secondary | ICD-10-CM | POA: Diagnosis not present

## 2024-05-17 MED ORDER — PEGFILGRASTIM-JMDB 6 MG/0.6ML ~~LOC~~ SOSY
6.0000 mg | PREFILLED_SYRINGE | Freq: Once | SUBCUTANEOUS | Status: AC
  Administered 2024-05-17: 6 mg via SUBCUTANEOUS
  Filled 2024-05-17: qty 0.6

## 2024-05-21 ENCOUNTER — Ambulatory Visit: Admitting: Oncology

## 2024-05-23 ENCOUNTER — Encounter: Payer: Self-pay | Admitting: *Deleted

## 2024-05-23 NOTE — Progress Notes (Signed)
 Rebecca Patton called and has not been feeling well since her infusion.  She reports she is having hallucinations--which she describes as weird dreams but not asleep, not sleeping well, nocturia, extreme fatigue, not drinking enough fluids and some constipation alternating with diarrhea.   Dr. Melanee would like her seen in [facility].  Rebecca Patton was not available to be seen today, so an appointment has been scheduled for tomorrow.

## 2024-05-24 ENCOUNTER — Inpatient Hospital Stay

## 2024-05-24 ENCOUNTER — Inpatient Hospital Stay (HOSPITAL_BASED_OUTPATIENT_CLINIC_OR_DEPARTMENT_OTHER): Admitting: Nurse Practitioner

## 2024-05-24 ENCOUNTER — Encounter: Payer: Self-pay | Admitting: Nurse Practitioner

## 2024-05-24 VITALS — BP 143/51 | HR 79 | Temp 99.4°F | Resp 18 | Wt 206.5 lb

## 2024-05-24 DIAGNOSIS — Z5111 Encounter for antineoplastic chemotherapy: Secondary | ICD-10-CM | POA: Diagnosis not present

## 2024-05-24 DIAGNOSIS — Z170 Estrogen receptor positive status [ER+]: Secondary | ICD-10-CM

## 2024-05-24 DIAGNOSIS — M545 Low back pain, unspecified: Secondary | ICD-10-CM | POA: Diagnosis not present

## 2024-05-24 DIAGNOSIS — M898X9 Other specified disorders of bone, unspecified site: Secondary | ICD-10-CM | POA: Diagnosis not present

## 2024-05-24 DIAGNOSIS — R350 Frequency of micturition: Secondary | ICD-10-CM | POA: Diagnosis not present

## 2024-05-24 DIAGNOSIS — C50411 Malignant neoplasm of upper-outer quadrant of right female breast: Secondary | ICD-10-CM | POA: Diagnosis not present

## 2024-05-24 DIAGNOSIS — G8929 Other chronic pain: Secondary | ICD-10-CM | POA: Diagnosis not present

## 2024-05-24 DIAGNOSIS — E039 Hypothyroidism, unspecified: Secondary | ICD-10-CM | POA: Diagnosis not present

## 2024-05-24 LAB — CMP (CANCER CENTER ONLY)
ALT: 30 U/L (ref 0–44)
AST: 25 U/L (ref 15–41)
Albumin: 3.4 g/dL — ABNORMAL LOW (ref 3.5–5.0)
Alkaline Phosphatase: 86 U/L (ref 38–126)
Anion gap: 7 (ref 5–15)
BUN: 23 mg/dL (ref 8–23)
CO2: 23 mmol/L (ref 22–32)
Calcium: 8.3 mg/dL — ABNORMAL LOW (ref 8.9–10.3)
Chloride: 101 mmol/L (ref 98–111)
Creatinine: 0.86 mg/dL (ref 0.44–1.00)
GFR, Estimated: >60 mL/min (ref >60)
Glucose, Bld: 138 mg/dL — ABNORMAL HIGH (ref 70–99)
Potassium: 4 mmol/L (ref 3.5–5.1)
Sodium: 131 mmol/L — ABNORMAL LOW (ref 135–145)
Total Bilirubin: <0.2 mg/dL (ref 0.0–1.2)
Total Protein: 6.4 g/dL — ABNORMAL LOW (ref 6.5–8.1)

## 2024-05-24 LAB — URINALYSIS, COMPLETE (UACMP) WITH MICROSCOPIC
Bacteria, UA: NONE SEEN
Bilirubin Urine: NEGATIVE
Glucose, UA: NEGATIVE mg/dL
Hgb urine dipstick: NEGATIVE
Ketones, ur: NEGATIVE mg/dL
Leukocytes,Ua: NEGATIVE
Nitrite: NEGATIVE
Protein, ur: 30 mg/dL — AB
Specific Gravity, Urine: 1.032 — ABNORMAL HIGH (ref 1.005–1.030)
Squamous Epithelial / HPF: 0 /HPF (ref 0–5)
pH: 5 (ref 5.0–8.0)

## 2024-05-24 LAB — CBC WITH DIFFERENTIAL (CANCER CENTER ONLY)
Abs Immature Granulocytes: 0.26 K/uL — ABNORMAL HIGH (ref 0.00–0.07)
Basophils Absolute: 0.1 K/uL (ref 0.0–0.1)
Basophils Relative: 2 %
Eosinophils Absolute: 0.1 K/uL (ref 0.0–0.5)
Eosinophils Relative: 4 %
HCT: 36.7 % (ref 36.0–46.0)
Hemoglobin: 12.1 g/dL (ref 12.0–15.0)
Immature Granulocytes: 7 %
Lymphocytes Relative: 23 %
Lymphs Abs: 0.9 K/uL (ref 0.7–4.0)
MCH: 29.7 pg (ref 26.0–34.0)
MCHC: 33 g/dL (ref 30.0–36.0)
MCV: 90 fL (ref 80.0–100.0)
Monocytes Absolute: 0.4 K/uL (ref 0.1–1.0)
Monocytes Relative: 9 %
Neutro Abs: 2 K/uL (ref 1.7–7.7)
Neutrophils Relative %: 55 %
Platelet Count: 104 K/uL — ABNORMAL LOW (ref 150–400)
RBC: 4.08 MIL/uL (ref 3.87–5.11)
RDW: 13.6 % (ref 11.5–15.5)
Smear Review: NORMAL
WBC Count: 3.7 K/uL — ABNORMAL LOW (ref 4.0–10.5)
nRBC: 0 % (ref 0.0–0.2)

## 2024-05-24 MED ORDER — OXYCODONE HCL 5 MG PO TABS: 5.0000 mg | ORAL_TABLET | Freq: Four times a day (QID) | ORAL | 0 refills | Status: DC | PRN

## 2024-05-24 MED ORDER — DEXAMETHASONE SODIUM PHOSPHATE 10 MG/ML IJ SOLN
4.0000 mg | Freq: Once | INTRAMUSCULAR | Status: AC
  Administered 2024-05-24: 4 mg via INTRAVENOUS
  Filled 2024-05-24: qty 1

## 2024-05-24 MED ORDER — SODIUM CHLORIDE 0.9 % IV SOLN
INTRAVENOUS | Status: DC
  Filled 2024-05-24 (×2): qty 250

## 2024-05-24 MED ORDER — DEXAMETHASONE 2 MG PO TABS: 2.0000 mg | ORAL_TABLET | Freq: Every morning | ORAL | 0 refills | Status: DC

## 2024-05-24 MED ORDER — MORPHINE SULFATE (PF) 2 MG/ML IV SOLN
2.0000 mg | Freq: Once | INTRAVENOUS | Status: AC
  Administered 2024-05-24: 2 mg via INTRAVENOUS
  Filled 2024-05-24: qty 1
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
## 2024-05-25 LAB — URINE CULTURE: Culture: <10000 — AB

## 2024-05-28 ENCOUNTER — Other Ambulatory Visit: Payer: Self-pay

## 2024-05-28 ENCOUNTER — Inpatient Hospital Stay (HOSPITAL_BASED_OUTPATIENT_CLINIC_OR_DEPARTMENT_OTHER): Admitting: Nurse Practitioner

## 2024-05-28 ENCOUNTER — Encounter: Payer: Self-pay | Admitting: Nurse Practitioner

## 2024-05-28 ENCOUNTER — Telehealth: Admitting: Nurse Practitioner

## 2024-05-28 ENCOUNTER — Telehealth: Payer: Self-pay | Admitting: *Deleted

## 2024-05-28 VITALS — BP 140/51 | HR 72 | Temp 98.6°F | Resp 18

## 2024-05-28 DIAGNOSIS — Z170 Estrogen receptor positive status [ER+]: Secondary | ICD-10-CM | POA: Diagnosis not present

## 2024-05-28 DIAGNOSIS — R350 Frequency of micturition: Secondary | ICD-10-CM | POA: Diagnosis not present

## 2024-05-28 DIAGNOSIS — G8929 Other chronic pain: Secondary | ICD-10-CM | POA: Diagnosis not present

## 2024-05-28 DIAGNOSIS — M545 Low back pain, unspecified: Secondary | ICD-10-CM | POA: Diagnosis not present

## 2024-05-28 DIAGNOSIS — Z452 Encounter for adjustment and management of vascular access device: Secondary | ICD-10-CM | POA: Diagnosis not present

## 2024-05-28 DIAGNOSIS — C50411 Malignant neoplasm of upper-outer quadrant of right female breast: Secondary | ICD-10-CM | POA: Diagnosis not present

## 2024-05-28 DIAGNOSIS — Z5111 Encounter for antineoplastic chemotherapy: Secondary | ICD-10-CM | POA: Diagnosis not present

## 2024-05-28 DIAGNOSIS — E039 Hypothyroidism, unspecified: Secondary | ICD-10-CM | POA: Diagnosis not present

## 2024-05-28 DIAGNOSIS — M898X9 Other specified disorders of bone, unspecified site: Secondary | ICD-10-CM | POA: Diagnosis not present

## 2024-05-28 DIAGNOSIS — R443 Hallucinations, unspecified: Secondary | ICD-10-CM | POA: Diagnosis not present

## 2024-05-28 DIAGNOSIS — J069 Acute upper respiratory infection, unspecified: Secondary | ICD-10-CM | POA: Diagnosis not present

## 2024-05-28 DIAGNOSIS — Z5189 Encounter for other specified aftercare: Secondary | ICD-10-CM | POA: Diagnosis not present

## 2024-05-28 MED ORDER — AZITHROMYCIN 250 MG PO TABS: ORAL_TABLET | ORAL | 0 refills | Status: DC

## 2024-05-28 MED ORDER — HYDROCOD POLI-CHLORPHE POLI ER 10-8 MG/5ML PO SUER: 5.0000 mL | Freq: Every evening | ORAL | 0 refills | Status: AC | PRN

## 2024-05-28 NOTE — Telephone Encounter (Signed)
 The patient said that she came over on 9/12 and got dexamethasone  and morphine .  Today she called and said that she can hear her lungs rattling and in pain and she is having lots of coughing and lots of drainage and she wanted to know if we can give her something to help her get better.
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
## 2024-05-30 ENCOUNTER — Encounter: Payer: Self-pay | Admitting: Oncology

## 2024-05-31 ENCOUNTER — Telehealth: Payer: Self-pay | Admitting: *Deleted

## 2024-05-31 MED ORDER — NYSTATIN 100000 UNIT/GM EX POWD: 1.0000 | Freq: Three times a day (TID) | CUTANEOUS | 0 refills | Status: DC

## 2024-05-31 NOTE — Telephone Encounter (Signed)
 Needs more nystatin  so that she can put it under her belly where he gets yeast there

## 2024-05-31 NOTE — Telephone Encounter (Signed)
 I called the patient and let her know that Dr. Melanee was good to get nystatin  for under the belly part of the and the patient says she will go and pick it up sometime today

## 2024-06-05 ENCOUNTER — Inpatient Hospital Stay (HOSPITAL_BASED_OUTPATIENT_CLINIC_OR_DEPARTMENT_OTHER): Admitting: Oncology

## 2024-06-05 ENCOUNTER — Encounter: Payer: Self-pay | Admitting: Oncology

## 2024-06-05 ENCOUNTER — Inpatient Hospital Stay

## 2024-06-05 ENCOUNTER — Other Ambulatory Visit: Payer: Self-pay

## 2024-06-05 VITALS — BP 123/59 | HR 68 | Resp 18

## 2024-06-05 VITALS — BP 136/79 | HR 86 | Temp 97.9°F | Resp 17 | Ht 66.0 in | Wt 207.8 lb

## 2024-06-05 DIAGNOSIS — C50411 Malignant neoplasm of upper-outer quadrant of right female breast: Secondary | ICD-10-CM

## 2024-06-05 DIAGNOSIS — Z170 Estrogen receptor positive status [ER+]: Secondary | ICD-10-CM | POA: Diagnosis not present

## 2024-06-05 DIAGNOSIS — Z5111 Encounter for antineoplastic chemotherapy: Secondary | ICD-10-CM | POA: Diagnosis not present

## 2024-06-05 LAB — CBC WITH DIFFERENTIAL (CANCER CENTER ONLY)
Abs Immature Granulocytes: 0.29 K/uL — ABNORMAL HIGH (ref 0.00–0.07)
Basophils Absolute: 0.1 K/uL (ref 0.0–0.1)
Basophils Relative: 1 %
Eosinophils Absolute: 0.2 K/uL (ref 0.0–0.5)
Eosinophils Relative: 1 %
HCT: 37.5 % (ref 36.0–46.0)
Hemoglobin: 12.2 g/dL (ref 12.0–15.0)
Immature Granulocytes: 2 %
Lymphocytes Relative: 11 %
Lymphs Abs: 1.4 K/uL (ref 0.7–4.0)
MCH: 29.2 pg (ref 26.0–34.0)
MCHC: 32.5 g/dL (ref 30.0–36.0)
MCV: 89.7 fL (ref 80.0–100.0)
Monocytes Absolute: 1.4 K/uL — ABNORMAL HIGH (ref 0.1–1.0)
Monocytes Relative: 11 %
Neutro Abs: 9.8 K/uL — ABNORMAL HIGH (ref 1.7–7.7)
Neutrophils Relative %: 74 %
Platelet Count: 252 K/uL (ref 150–400)
RBC: 4.18 MIL/uL (ref 3.87–5.11)
RDW: 14.6 % (ref 11.5–15.5)
WBC Count: 13.2 K/uL — ABNORMAL HIGH (ref 4.0–10.5)
nRBC: 0 % (ref 0.0–0.2)

## 2024-06-05 LAB — CMP (CANCER CENTER ONLY)
ALT: 35 U/L (ref 0–44)
AST: 35 U/L (ref 15–41)
Albumin: 3.5 g/dL (ref 3.5–5.0)
Alkaline Phosphatase: 89 U/L (ref 38–126)
Anion gap: 7 (ref 5–15)
BUN: 18 mg/dL (ref 8–23)
CO2: 27 mmol/L (ref 22–32)
Calcium: 9.1 mg/dL (ref 8.9–10.3)
Chloride: 100 mmol/L (ref 98–111)
Creatinine: 0.77 mg/dL (ref 0.44–1.00)
GFR, Estimated: >60 mL/min (ref >60)
Glucose, Bld: 108 mg/dL — ABNORMAL HIGH (ref 70–99)
Potassium: 3.8 mmol/L (ref 3.5–5.1)
Sodium: 134 mmol/L — ABNORMAL LOW (ref 135–145)
Total Bilirubin: 0.5 mg/dL (ref 0.0–1.2)
Total Protein: 6.7 g/dL (ref 6.5–8.1)

## 2024-06-05 MED ORDER — DOXORUBICIN HCL CHEMO IV INJECTION 2 MG/ML
60.0000 mg/m2 | Freq: Once | INTRAVENOUS | Status: AC
  Administered 2024-06-05: 126 mg via INTRAVENOUS
  Filled 2024-06-05: qty 63

## 2024-06-05 MED ORDER — SODIUM CHLORIDE 0.9 % IV SOLN
INTRAVENOUS | Status: DC
  Filled 2024-06-05: qty 250

## 2024-06-05 MED ORDER — PALONOSETRON HCL INJECTION 0.25 MG/5ML
0.2500 mg | Freq: Once | INTRAVENOUS | Status: AC
  Administered 2024-06-05: 0.25 mg via INTRAVENOUS
  Filled 2024-06-05: qty 5

## 2024-06-05 MED ORDER — DEXAMETHASONE SODIUM PHOSPHATE 10 MG/ML IJ SOLN
10.0000 mg | Freq: Once | INTRAMUSCULAR | Status: AC
  Administered 2024-06-05: 10 mg via INTRAVENOUS
  Filled 2024-06-05: qty 1

## 2024-06-05 MED ORDER — SODIUM CHLORIDE 0.9 % IV SOLN
600.0000 mg/m2 | Freq: Once | INTRAVENOUS | Status: AC
  Administered 2024-06-05: 1260 mg via INTRAVENOUS
  Filled 2024-06-05: qty 63

## 2024-06-05 MED ORDER — APREPITANT 130 MG/18ML IV EMUL
130.0000 mg | Freq: Once | INTRAVENOUS | Status: AC
  Administered 2024-06-05: 130 mg via INTRAVENOUS
  Filled 2024-06-05: qty 18

## 2024-06-05 NOTE — Patient Instructions (Signed)
 [facility] - A DEPT OF [facility]  Discharge Instructions: Thank you for choosing [facility] to provide your oncology and hematology care.  If you have a lab appointment with the Cancer Center, please go directly to the Cancer Center and check in at the registration area.  Wear comfortable clothing and clothing appropriate for easy access to any Portacath or PICC line.   We strive to give you quality time with your provider. You may need to reschedule your appointment if you arrive late (15 or more minutes).  Arriving late affects you and other patients whose appointments are after yours.  Also, if you miss three or more appointments without notifying the office, you may be dismissed from the clinic at the provider's discretion.      For prescription refill requests, have your pharmacy contact our office and allow 72 hours for refills to be completed.    Today you received the following chemotherapy and/or immunotherapy agents Doxorubicin , Cyclophosphamide        To help prevent nausea and vomiting after your treatment, we encourage you to take your nausea medication as directed.  BELOW ARE SYMPTOMS THAT SHOULD BE REPORTED IMMEDIATELY: *FEVER GREATER THAN 100.4 F (38 C) OR HIGHER *CHILLS OR SWEATING *NAUSEA AND VOMITING THAT IS NOT CONTROLLED WITH YOUR NAUSEA MEDICATION *UNUSUAL SHORTNESS OF BREATH *UNUSUAL BRUISING OR BLEEDING *URINARY PROBLEMS (pain or burning when urinating, or frequent urination) *BOWEL PROBLEMS (unusual diarrhea, constipation, pain near the anus) TENDERNESS IN MOUTH AND THROAT WITH OR WITHOUT PRESENCE OF ULCERS (sore throat, sores in mouth, or a toothache) UNUSUAL RASH, SWELLING OR PAIN  UNUSUAL VAGINAL DISCHARGE OR ITCHING   Items with * indicate a potential emergency and should be followed up as soon as possible or go to the Emergency Department if any problems should occur.  Please show the CHEMOTHERAPY ALERT  CARD or IMMUNOTHERAPY ALERT CARD at check-in to the Emergency Department and triage nurse.  Should you have questions after your visit or need to cancel or reschedule your appointment, please contact [facility] - A DEPT OF [facility]  [phone] and follow the prompts.  Office hours are 8:00 a.m. to 4:30 p.m. Monday - Friday. Please note that voicemails left after 4:00 p.m. may not be returned until the following business day.  We are closed weekends and major holidays. You have access to a nurse at all times for urgent questions. Please call the main number to the clinic [phone] and follow the prompts.  For any non-urgent questions, you may also contact your provider using MyChart. We now offer e-Visits for anyone 34 and older to request care online for non-urgent symptoms. For details visit [email or web address].   Also download the MyChart app! Go to the app store, search MyChart, open the app, select [place], and log in with your MyChart username and password.

## 2024-06-05 NOTE — Progress Notes (Signed)
 Cough for few weeks; just finished taking arithromycin and dexamethasone . Regarding BM's No more than usual.  Neuropathy for years, remains unchanged.  She said she lost her hair this weekend; scalp was little tingly.
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
## 2024-06-06 ENCOUNTER — Encounter: Payer: Self-pay | Admitting: Oncology

## 2024-06-06 ENCOUNTER — Other Ambulatory Visit: Payer: Self-pay

## 2024-06-07 ENCOUNTER — Inpatient Hospital Stay

## 2024-06-07 DIAGNOSIS — J069 Acute upper respiratory infection, unspecified: Secondary | ICD-10-CM | POA: Diagnosis not present

## 2024-06-07 DIAGNOSIS — M898X9 Other specified disorders of bone, unspecified site: Secondary | ICD-10-CM | POA: Diagnosis not present

## 2024-06-07 DIAGNOSIS — R350 Frequency of micturition: Secondary | ICD-10-CM | POA: Diagnosis not present

## 2024-06-07 DIAGNOSIS — Z5189 Encounter for other specified aftercare: Secondary | ICD-10-CM | POA: Diagnosis not present

## 2024-06-07 DIAGNOSIS — Z5111 Encounter for antineoplastic chemotherapy: Secondary | ICD-10-CM | POA: Diagnosis not present

## 2024-06-07 DIAGNOSIS — C50411 Malignant neoplasm of upper-outer quadrant of right female breast: Secondary | ICD-10-CM

## 2024-06-07 DIAGNOSIS — G8929 Other chronic pain: Secondary | ICD-10-CM | POA: Diagnosis not present

## 2024-06-07 DIAGNOSIS — E039 Hypothyroidism, unspecified: Secondary | ICD-10-CM | POA: Diagnosis not present

## 2024-06-07 DIAGNOSIS — Z452 Encounter for adjustment and management of vascular access device: Secondary | ICD-10-CM | POA: Diagnosis not present

## 2024-06-07 DIAGNOSIS — Z170 Estrogen receptor positive status [ER+]: Secondary | ICD-10-CM | POA: Diagnosis not present

## 2024-06-07 DIAGNOSIS — R443 Hallucinations, unspecified: Secondary | ICD-10-CM | POA: Diagnosis not present

## 2024-06-07 DIAGNOSIS — M545 Low back pain, unspecified: Secondary | ICD-10-CM | POA: Diagnosis not present

## 2024-06-07 MED ORDER — PEGFILGRASTIM-JMDB 6 MG/0.6ML ~~LOC~~ SOSY
6.0000 mg | PREFILLED_SYRINGE | Freq: Once | SUBCUTANEOUS | Status: AC
  Administered 2024-06-07: 6 mg via SUBCUTANEOUS
  Filled 2024-06-07: qty 0.6

## 2024-06-11 ENCOUNTER — Encounter: Payer: Self-pay | Admitting: *Deleted

## 2024-06-11 MED ORDER — TRAZODONE HCL 50 MG PO TABS: 50.0000 mg | ORAL_TABLET | Freq: Every day | ORAL | 1 refills | Status: DC

## 2024-06-11 NOTE — Progress Notes (Signed)
 Rebecca Patton has been having insomnia since starting chemotherapy.   Dr. Melanee said she could try trazadone 50mg  at bedtime, rx sent to [facility] in [place].

## 2024-06-19 ENCOUNTER — Encounter: Payer: Self-pay | Admitting: Oncology

## 2024-06-26 ENCOUNTER — Inpatient Hospital Stay (HOSPITAL_BASED_OUTPATIENT_CLINIC_OR_DEPARTMENT_OTHER): Admitting: Oncology

## 2024-06-26 ENCOUNTER — Encounter: Payer: Self-pay | Admitting: Oncology

## 2024-06-26 ENCOUNTER — Inpatient Hospital Stay: Attending: Oncology

## 2024-06-26 ENCOUNTER — Inpatient Hospital Stay

## 2024-06-26 VITALS — BP 130/56 | HR 72

## 2024-06-26 VITALS — BP 123/58 | HR 72 | Temp 97.7°F | Resp 12 | Ht 66.0 in | Wt 207.2 lb

## 2024-06-26 DIAGNOSIS — T451X5A Adverse effect of antineoplastic and immunosuppressive drugs, initial encounter: Secondary | ICD-10-CM

## 2024-06-26 DIAGNOSIS — Z5111 Encounter for antineoplastic chemotherapy: Secondary | ICD-10-CM

## 2024-06-26 DIAGNOSIS — E785 Hyperlipidemia, unspecified: Secondary | ICD-10-CM | POA: Diagnosis not present

## 2024-06-26 DIAGNOSIS — D6481 Anemia due to antineoplastic chemotherapy: Secondary | ICD-10-CM | POA: Insufficient documentation

## 2024-06-26 DIAGNOSIS — Z5189 Encounter for other specified aftercare: Secondary | ICD-10-CM | POA: Diagnosis not present

## 2024-06-26 DIAGNOSIS — E039 Hypothyroidism, unspecified: Secondary | ICD-10-CM | POA: Diagnosis not present

## 2024-06-26 DIAGNOSIS — C50411 Malignant neoplasm of upper-outer quadrant of right female breast: Secondary | ICD-10-CM

## 2024-06-26 DIAGNOSIS — Z170 Estrogen receptor positive status [ER+]: Secondary | ICD-10-CM | POA: Diagnosis not present

## 2024-06-26 LAB — CBC WITH DIFFERENTIAL (CANCER CENTER ONLY)
Abs Immature Granulocytes: 0.1 K/uL — ABNORMAL HIGH (ref 0.00–0.07)
Basophils Absolute: 0.1 K/uL (ref 0.0–0.1)
Basophils Relative: 1 %
Eosinophils Absolute: 0.1 K/uL (ref 0.0–0.5)
Eosinophils Relative: 1 %
HCT: 32.7 % — ABNORMAL LOW (ref 36.0–46.0)
Hemoglobin: 10.9 g/dL — ABNORMAL LOW (ref 12.0–15.0)
Immature Granulocytes: 1 %
Lymphocytes Relative: 11 %
Lymphs Abs: 1.1 K/uL (ref 0.7–4.0)
MCH: 30.1 pg (ref 26.0–34.0)
MCHC: 33.3 g/dL (ref 30.0–36.0)
MCV: 90.3 fL (ref 80.0–100.0)
Monocytes Absolute: 1.1 K/uL — ABNORMAL HIGH (ref 0.1–1.0)
Monocytes Relative: 12 %
Neutro Abs: 7.2 K/uL (ref 1.7–7.7)
Neutrophils Relative %: 74 %
Platelet Count: 200 K/uL (ref 150–400)
RBC: 3.62 MIL/uL — ABNORMAL LOW (ref 3.87–5.11)
RDW: 16.5 % — ABNORMAL HIGH (ref 11.5–15.5)
WBC Count: 9.7 K/uL (ref 4.0–10.5)
nRBC: 0.2 % (ref 0.0–0.2)

## 2024-06-26 LAB — VITAMIN B12: Vitamin B-12: >4000 pg/mL — ABNORMAL HIGH (ref 180–914)

## 2024-06-26 LAB — IRON AND TIBC
Iron: 64 ug/dL (ref 28–170)
Saturation Ratios: 21 % (ref 10.4–31.8)
TIBC: 308 ug/dL (ref 250–450)
UIBC: 244 ug/dL

## 2024-06-26 LAB — CMP (CANCER CENTER ONLY)
ALT: 17 U/L (ref 0–44)
AST: 23 U/L (ref 15–41)
Albumin: 3.5 g/dL (ref 3.5–5.0)
Alkaline Phosphatase: 76 U/L (ref 38–126)
Anion gap: 7 (ref 5–15)
BUN: 22 mg/dL (ref 8–23)
CO2: 26 mmol/L (ref 22–32)
Calcium: 8.9 mg/dL (ref 8.9–10.3)
Chloride: 102 mmol/L (ref 98–111)
Creatinine: 0.74 mg/dL (ref 0.44–1.00)
GFR, Estimated: >60 mL/min (ref >60)
Glucose, Bld: 105 mg/dL — ABNORMAL HIGH (ref 70–99)
Potassium: 3.9 mmol/L (ref 3.5–5.1)
Sodium: 135 mmol/L (ref 135–145)
Total Bilirubin: 0.5 mg/dL (ref 0.0–1.2)
Total Protein: 6.4 g/dL — ABNORMAL LOW (ref 6.5–8.1)

## 2024-06-26 LAB — FOLATE: Folate: >20 ng/mL (ref >5.9)

## 2024-06-26 LAB — FERRITIN: Ferritin: 266 ng/mL (ref 11–307)

## 2024-06-26 MED ORDER — DEXAMETHASONE SOD PHOSPHATE PF 10 MG/ML IJ SOLN
10.0000 mg | Freq: Once | INTRAMUSCULAR | Status: AC
  Administered 2024-06-26: 10 mg via INTRAVENOUS

## 2024-06-26 MED ORDER — DOXORUBICIN HCL CHEMO IV INJECTION 2 MG/ML
60.0000 mg/m2 | Freq: Once | INTRAVENOUS | Status: AC
  Administered 2024-06-26: 126 mg via INTRAVENOUS
  Filled 2024-06-26: qty 63

## 2024-06-26 MED ORDER — APREPITANT 130 MG/18ML IV EMUL
130.0000 mg | Freq: Once | INTRAVENOUS | Status: AC
  Administered 2024-06-26: 130 mg via INTRAVENOUS
  Filled 2024-06-26: qty 18

## 2024-06-26 MED ORDER — PALONOSETRON HCL INJECTION 0.25 MG/5ML
0.2500 mg | Freq: Once | INTRAVENOUS | Status: AC
  Administered 2024-06-26: 0.25 mg via INTRAVENOUS
  Filled 2024-06-26: qty 5

## 2024-06-26 MED ORDER — SODIUM CHLORIDE 0.9 % IV SOLN
INTRAVENOUS | Status: DC
  Filled 2024-06-26: qty 250

## 2024-06-26 MED ORDER — SODIUM CHLORIDE 0.9 % IV SOLN
600.0000 mg/m2 | Freq: Once | INTRAVENOUS | Status: AC
  Administered 2024-06-26: 1260 mg via INTRAVENOUS
  Filled 2024-06-26: qty 63

## 2024-06-26 NOTE — Progress Notes (Signed)
 Pt would like to know if she can use refresh eye drops, she has dry eyes? Has appt with Dr. Mevelyn.
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
## 2024-06-27 ENCOUNTER — Other Ambulatory Visit: Payer: Self-pay

## 2024-06-27 ENCOUNTER — Encounter: Payer: Self-pay | Admitting: Oncology

## 2024-06-28 ENCOUNTER — Inpatient Hospital Stay

## 2024-06-28 DIAGNOSIS — Z5111 Encounter for antineoplastic chemotherapy: Secondary | ICD-10-CM | POA: Diagnosis not present

## 2024-06-28 DIAGNOSIS — C50411 Malignant neoplasm of upper-outer quadrant of right female breast: Secondary | ICD-10-CM

## 2024-06-28 MED ORDER — PEGFILGRASTIM-JMDB 6 MG/0.6ML ~~LOC~~ SOSY
6.0000 mg | PREFILLED_SYRINGE | Freq: Once | SUBCUTANEOUS | Status: AC
  Administered 2024-06-28: 6 mg via SUBCUTANEOUS
  Filled 2024-06-28: qty 0.6

## 2024-07-03 ENCOUNTER — Other Ambulatory Visit: Payer: Self-pay | Admitting: Oncology

## 2024-07-05 ENCOUNTER — Encounter: Payer: Self-pay | Admitting: Oncology

## 2024-07-05 ENCOUNTER — Encounter: Payer: Self-pay | Admitting: *Deleted

## 2024-07-05 DIAGNOSIS — C50411 Malignant neoplasm of upper-outer quadrant of right female breast: Secondary | ICD-10-CM

## 2024-07-05 NOTE — Progress Notes (Signed)
 Rebecca Patton called to ask about stopping her OTC supplements and when to resume as she has been having increased nausea.    Per Dr. Melanee she may stop the supplements and can restart in 2-3 weeks if she feels better but she doesn't need to restart the supplements either.   Dietician consult also placed for the increased nausea.

## 2024-07-12 ENCOUNTER — Encounter: Payer: Self-pay | Admitting: Oncology

## 2024-07-12 NOTE — Telephone Encounter (Signed)
 Encounter opened in error.

## 2024-07-17 ENCOUNTER — Encounter: Payer: Self-pay | Admitting: Oncology

## 2024-07-17 ENCOUNTER — Inpatient Hospital Stay: Attending: Oncology

## 2024-07-17 ENCOUNTER — Inpatient Hospital Stay

## 2024-07-17 ENCOUNTER — Inpatient Hospital Stay (HOSPITAL_BASED_OUTPATIENT_CLINIC_OR_DEPARTMENT_OTHER): Admitting: Oncology

## 2024-07-17 VITALS — BP 163/85 | HR 70 | Temp 97.5°F | Resp 19 | Ht 66.0 in | Wt 217.0 lb

## 2024-07-17 VITALS — BP 138/53 | HR 70

## 2024-07-17 DIAGNOSIS — C50411 Malignant neoplasm of upper-outer quadrant of right female breast: Secondary | ICD-10-CM | POA: Insufficient documentation

## 2024-07-17 DIAGNOSIS — D6481 Anemia due to antineoplastic chemotherapy: Secondary | ICD-10-CM

## 2024-07-17 DIAGNOSIS — Z170 Estrogen receptor positive status [ER+]: Secondary | ICD-10-CM | POA: Diagnosis not present

## 2024-07-17 DIAGNOSIS — E039 Hypothyroidism, unspecified: Secondary | ICD-10-CM | POA: Diagnosis not present

## 2024-07-17 DIAGNOSIS — T451X5A Adverse effect of antineoplastic and immunosuppressive drugs, initial encounter: Secondary | ICD-10-CM

## 2024-07-17 DIAGNOSIS — E785 Hyperlipidemia, unspecified: Secondary | ICD-10-CM | POA: Insufficient documentation

## 2024-07-17 DIAGNOSIS — G8929 Other chronic pain: Secondary | ICD-10-CM | POA: Diagnosis not present

## 2024-07-17 DIAGNOSIS — Z5111 Encounter for antineoplastic chemotherapy: Secondary | ICD-10-CM | POA: Insufficient documentation

## 2024-07-17 DIAGNOSIS — Z5189 Encounter for other specified aftercare: Secondary | ICD-10-CM | POA: Insufficient documentation

## 2024-07-17 DIAGNOSIS — M549 Dorsalgia, unspecified: Secondary | ICD-10-CM | POA: Diagnosis not present

## 2024-07-17 LAB — CMP (CANCER CENTER ONLY)
ALT: 16 U/L (ref 0–44)
AST: 24 U/L (ref 15–41)
Albumin: 3.6 g/dL (ref 3.5–5.0)
Alkaline Phosphatase: 75 U/L (ref 38–126)
Anion gap: 8 (ref 5–15)
BUN: 20 mg/dL (ref 8–23)
CO2: 26 mmol/L (ref 22–32)
Calcium: 8.9 mg/dL (ref 8.9–10.3)
Chloride: 103 mmol/L (ref 98–111)
Creatinine: 0.61 mg/dL (ref 0.44–1.00)
GFR, Estimated: >60 mL/min (ref >60)
Glucose, Bld: 107 mg/dL — ABNORMAL HIGH (ref 70–99)
Potassium: 3.6 mmol/L (ref 3.5–5.1)
Sodium: 137 mmol/L (ref 135–145)
Total Bilirubin: 0.6 mg/dL (ref 0.0–1.2)
Total Protein: 6.4 g/dL — ABNORMAL LOW (ref 6.5–8.1)

## 2024-07-17 LAB — CBC WITH DIFFERENTIAL (CANCER CENTER ONLY)
Abs Immature Granulocytes: 0.08 K/uL — ABNORMAL HIGH (ref 0.00–0.07)
Basophils Absolute: 0.1 K/uL (ref 0.0–0.1)
Basophils Relative: 1 %
Eosinophils Absolute: 0.1 K/uL (ref 0.0–0.5)
Eosinophils Relative: 2 %
HCT: 32.1 % — ABNORMAL LOW (ref 36.0–46.0)
Hemoglobin: 10.6 g/dL — ABNORMAL LOW (ref 12.0–15.0)
Immature Granulocytes: 1 %
Lymphocytes Relative: 10 %
Lymphs Abs: 0.8 K/uL (ref 0.7–4.0)
MCH: 31 pg (ref 26.0–34.0)
MCHC: 33 g/dL (ref 30.0–36.0)
MCV: 93.9 fL (ref 80.0–100.0)
Monocytes Absolute: 1.1 K/uL — ABNORMAL HIGH (ref 0.1–1.0)
Monocytes Relative: 13 %
Neutro Abs: 6.3 K/uL (ref 1.7–7.7)
Neutrophils Relative %: 73 %
Platelet Count: 213 K/uL (ref 150–400)
RBC: 3.42 MIL/uL — ABNORMAL LOW (ref 3.87–5.11)
RDW: 18.4 % — ABNORMAL HIGH (ref 11.5–15.5)
WBC Count: 8.5 K/uL (ref 4.0–10.5)
nRBC: 0 % (ref 0.0–0.2)

## 2024-07-17 MED ORDER — APREPITANT 130 MG/18ML IV EMUL
130.0000 mg | Freq: Once | INTRAVENOUS | Status: AC
  Administered 2024-07-17: 130 mg via INTRAVENOUS
  Filled 2024-07-17: qty 18

## 2024-07-17 MED ORDER — DEXAMETHASONE SOD PHOSPHATE PF 10 MG/ML IJ SOLN
10.0000 mg | Freq: Once | INTRAMUSCULAR | Status: AC
  Administered 2024-07-17: 10 mg via INTRAVENOUS

## 2024-07-17 MED ORDER — SODIUM CHLORIDE 0.9 % IV SOLN
INTRAVENOUS | Status: DC
  Filled 2024-07-17: qty 250

## 2024-07-17 MED ORDER — PALONOSETRON HCL INJECTION 0.25 MG/5ML
0.2500 mg | Freq: Once | INTRAVENOUS | Status: AC
  Administered 2024-07-17: 0.25 mg via INTRAVENOUS
  Filled 2024-07-17: qty 5

## 2024-07-17 MED ORDER — OXYCODONE HCL 5 MG PO TABS: 5.0000 mg | ORAL_TABLET | Freq: Four times a day (QID) | ORAL | 0 refills | Status: AC | PRN

## 2024-07-17 MED ORDER — SODIUM CHLORIDE 0.9 % IV SOLN
600.0000 mg/m2 | Freq: Once | INTRAVENOUS | Status: AC
  Administered 2024-07-17: 1260 mg via INTRAVENOUS
  Filled 2024-07-17: qty 63

## 2024-07-17 MED ORDER — DOXORUBICIN HCL CHEMO IV INJECTION 2 MG/ML
60.0000 mg/m2 | Freq: Once | INTRAVENOUS | Status: AC
  Administered 2024-07-17: 126 mg via INTRAVENOUS
  Filled 2024-07-17: qty 63

## 2024-07-17 NOTE — Progress Notes (Signed)
 Patient feeling okay with no new or acute concerns.

## 2024-07-17 NOTE — Progress Notes (Signed)
 Nutrition Assessment   Reason for Assessment:   On chemo and experiencing nausea.  ASSESSMENT:   78 year old female with breast cancer.  Past medical history of GERD, HLD, pre-DM, uterine cancer.  Patient receiving chemotherapy (AC then followed by Taxol).    Met with patient during infusion.  Reports few episodes of queasy feeling.  Biggest issue effecting intake is taste change.  Coffee does not taste good.  Feels that appetite is good.  Does not eat very many vegetables     Medications: zofran , compazine , prilosec, MVI   Labs: reviewed   Anthropometrics:   Height: 66 inches Weight: 217 lb UBW: 207 lb per chart BMI: 33   NUTRITION DIAGNOSIS: Inadequate oral intake related to cancer related treatment side effects as evidenced by taste alterations    INTERVENTION:  Discussed strategies to help with taste change.  Handout provided Encouraged protein rich foods Contact information provided   MONITORING, EVALUATION, GOAL: weight trends, intake   Next Visit: Wed, Nov 26 during infusion  Marlys Stegmaier B. Dasie SOLON, CSO, LDN Registered Dietitian [phone]

## 2024-07-17 NOTE — Patient Instructions (Signed)
 [facility] - A DEPT OF [facility]  Discharge Instructions: Thank you for choosing [facility] to provide your oncology and hematology care.  If you have a lab appointment with the Cancer Center, please go directly to the Cancer Center and check in at the registration area.  Wear comfortable clothing and clothing appropriate for easy access to any Portacath or PICC line.   We strive to give you quality time with your provider. You may need to reschedule your appointment if you arrive late (15 or more minutes).  Arriving late affects you and other patients whose appointments are after yours.  Also, if you miss three or more appointments without notifying the office, you may be dismissed from the clinic at the provider's discretion.      For prescription refill requests, have your pharmacy contact our office and allow 72 hours for refills to be completed.    Today you received the following chemotherapy and/or immunotherapy agents- adriamycin , cytoxin      To help prevent nausea and vomiting after your treatment, we encourage you to take your nausea medication as directed.  BELOW ARE SYMPTOMS THAT SHOULD BE REPORTED IMMEDIATELY: *FEVER GREATER THAN 100.4 F (38 C) OR HIGHER *CHILLS OR SWEATING *NAUSEA AND VOMITING THAT IS NOT CONTROLLED WITH YOUR NAUSEA MEDICATION *UNUSUAL SHORTNESS OF BREATH *UNUSUAL BRUISING OR BLEEDING *URINARY PROBLEMS (pain or burning when urinating, or frequent urination) *BOWEL PROBLEMS (unusual diarrhea, constipation, pain near the anus) TENDERNESS IN MOUTH AND THROAT WITH OR WITHOUT PRESENCE OF ULCERS (sore throat, sores in mouth, or a toothache) UNUSUAL RASH, SWELLING OR PAIN  UNUSUAL VAGINAL DISCHARGE OR ITCHING   Items with * indicate a potential emergency and should be followed up as soon as possible or go to the Emergency Department if any problems should occur.  Please show the CHEMOTHERAPY ALERT CARD or  IMMUNOTHERAPY ALERT CARD at check-in to the Emergency Department and triage nurse.  Should you have questions after your visit or need to cancel or reschedule your appointment, please contact [facility] - A DEPT OF [facility]  [phone] and follow the prompts.  Office hours are 8:00 a.m. to 4:30 p.m. Monday - Friday. Please note that voicemails left after 4:00 p.m. may not be returned until the following business day.  We are closed weekends and major holidays. You have access to a nurse at all times for urgent questions. Please call the main number to the clinic [phone] and follow the prompts.  For any non-urgent questions, you may also contact your provider using MyChart. We now offer e-Visits for anyone 23 and older to request care online for non-urgent symptoms. For details visit [email or web address].   Also download the MyChart app! Go to the app store, search MyChart, open the app, select [place], and log in with your MyChart username and password.
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
## 2024-07-18 ENCOUNTER — Other Ambulatory Visit: Payer: Self-pay

## 2024-07-19 ENCOUNTER — Inpatient Hospital Stay

## 2024-07-19 DIAGNOSIS — G8929 Other chronic pain: Secondary | ICD-10-CM | POA: Diagnosis not present

## 2024-07-19 DIAGNOSIS — Z5111 Encounter for antineoplastic chemotherapy: Secondary | ICD-10-CM | POA: Diagnosis not present

## 2024-07-19 DIAGNOSIS — C50411 Malignant neoplasm of upper-outer quadrant of right female breast: Secondary | ICD-10-CM | POA: Diagnosis not present

## 2024-07-19 DIAGNOSIS — Z5189 Encounter for other specified aftercare: Secondary | ICD-10-CM | POA: Diagnosis not present

## 2024-07-19 DIAGNOSIS — Z170 Estrogen receptor positive status [ER+]: Secondary | ICD-10-CM

## 2024-07-19 DIAGNOSIS — E039 Hypothyroidism, unspecified: Secondary | ICD-10-CM | POA: Diagnosis not present

## 2024-07-19 DIAGNOSIS — E785 Hyperlipidemia, unspecified: Secondary | ICD-10-CM | POA: Diagnosis not present

## 2024-07-19 DIAGNOSIS — D6481 Anemia due to antineoplastic chemotherapy: Secondary | ICD-10-CM | POA: Diagnosis not present

## 2024-07-19 DIAGNOSIS — M549 Dorsalgia, unspecified: Secondary | ICD-10-CM | POA: Diagnosis not present

## 2024-07-19 MED ORDER — PEGFILGRASTIM-JMDB 6 MG/0.6ML ~~LOC~~ SOSY
6.0000 mg | PREFILLED_SYRINGE | Freq: Once | SUBCUTANEOUS | Status: AC
  Administered 2024-07-19: 6 mg via SUBCUTANEOUS
  Filled 2024-07-19: qty 0.6

## 2024-07-23 ENCOUNTER — Encounter: Payer: Self-pay | Admitting: Oncology

## 2024-07-25 ENCOUNTER — Other Ambulatory Visit: Payer: Self-pay

## 2024-07-26 ENCOUNTER — Encounter: Payer: Self-pay | Admitting: Oncology

## 2024-07-26 ENCOUNTER — Encounter: Payer: Self-pay | Admitting: *Deleted

## 2024-07-26 ENCOUNTER — Other Ambulatory Visit: Payer: Self-pay

## 2024-07-26 NOTE — Progress Notes (Signed)
 Rebecca Patton noticed the great toe toenail on her right foot is turning blue.   It is not painful and no signs of infection.   Explained that this can happen with chemotherapy and there is not much that can be done.   She will keep an eye on the toe and let us  know if it shows s/s of infection or becomes painful.  She will also send a picture in mychart.

## 2024-07-31 ENCOUNTER — Encounter: Payer: Self-pay | Admitting: Oncology

## 2024-07-31 ENCOUNTER — Inpatient Hospital Stay (HOSPITAL_BASED_OUTPATIENT_CLINIC_OR_DEPARTMENT_OTHER): Admitting: Oncology

## 2024-07-31 ENCOUNTER — Inpatient Hospital Stay

## 2024-07-31 ENCOUNTER — Inpatient Hospital Stay: Admitting: Oncology

## 2024-07-31 VITALS — BP 141/69 | HR 62

## 2024-07-31 VITALS — BP 137/65 | HR 77 | Temp 96.7°F | Resp 18 | Ht 66.0 in | Wt 207.5 lb

## 2024-07-31 DIAGNOSIS — T451X5A Adverse effect of antineoplastic and immunosuppressive drugs, initial encounter: Secondary | ICD-10-CM

## 2024-07-31 DIAGNOSIS — Z170 Estrogen receptor positive status [ER+]: Secondary | ICD-10-CM | POA: Diagnosis not present

## 2024-07-31 DIAGNOSIS — Z5111 Encounter for antineoplastic chemotherapy: Secondary | ICD-10-CM | POA: Diagnosis not present

## 2024-07-31 DIAGNOSIS — E785 Hyperlipidemia, unspecified: Secondary | ICD-10-CM | POA: Diagnosis not present

## 2024-07-31 DIAGNOSIS — D6481 Anemia due to antineoplastic chemotherapy: Secondary | ICD-10-CM

## 2024-07-31 DIAGNOSIS — G8929 Other chronic pain: Secondary | ICD-10-CM | POA: Diagnosis not present

## 2024-07-31 DIAGNOSIS — C50411 Malignant neoplasm of upper-outer quadrant of right female breast: Secondary | ICD-10-CM

## 2024-07-31 DIAGNOSIS — D6959 Other secondary thrombocytopenia: Secondary | ICD-10-CM | POA: Diagnosis not present

## 2024-07-31 DIAGNOSIS — Z5189 Encounter for other specified aftercare: Secondary | ICD-10-CM | POA: Diagnosis not present

## 2024-07-31 DIAGNOSIS — E039 Hypothyroidism, unspecified: Secondary | ICD-10-CM | POA: Diagnosis not present

## 2024-07-31 DIAGNOSIS — M549 Dorsalgia, unspecified: Secondary | ICD-10-CM | POA: Diagnosis not present

## 2024-07-31 LAB — CBC WITH DIFFERENTIAL (CANCER CENTER ONLY)
Abs Immature Granulocytes: 0.47 K/uL — ABNORMAL HIGH (ref 0.00–0.07)
Basophils Absolute: 0.1 K/uL (ref 0.0–0.1)
Basophils Relative: 1 %
Eosinophils Absolute: 0.2 K/uL (ref 0.0–0.5)
Eosinophils Relative: 2 %
HCT: 31.8 % — ABNORMAL LOW (ref 36.0–46.0)
Hemoglobin: 10.2 g/dL — ABNORMAL LOW (ref 12.0–15.0)
Immature Granulocytes: 5 %
Lymphocytes Relative: 9 %
Lymphs Abs: 0.8 K/uL (ref 0.7–4.0)
MCH: 30.6 pg (ref 26.0–34.0)
MCHC: 32.1 g/dL (ref 30.0–36.0)
MCV: 95.5 fL (ref 80.0–100.0)
Monocytes Absolute: 0.9 K/uL (ref 0.1–1.0)
Monocytes Relative: 10 %
Neutro Abs: 6.3 K/uL (ref 1.7–7.7)
Neutrophils Relative %: 73 %
Platelet Count: 117 K/uL — ABNORMAL LOW (ref 150–400)
RBC: 3.33 MIL/uL — ABNORMAL LOW (ref 3.87–5.11)
RDW: 17.8 % — ABNORMAL HIGH (ref 11.5–15.5)
WBC Count: 8.8 K/uL (ref 4.0–10.5)
nRBC: 0.6 % — ABNORMAL HIGH (ref 0.0–0.2)

## 2024-07-31 LAB — CMP (CANCER CENTER ONLY)
ALT: 14 U/L (ref 0–44)
AST: 21 U/L (ref 15–41)
Albumin: 3.6 g/dL (ref 3.5–5.0)
Alkaline Phosphatase: 88 U/L (ref 38–126)
Anion gap: 8 (ref 5–15)
BUN: 16 mg/dL (ref 8–23)
CO2: 25 mmol/L (ref 22–32)
Calcium: 8.7 mg/dL — ABNORMAL LOW (ref 8.9–10.3)
Chloride: 105 mmol/L (ref 98–111)
Creatinine: 0.85 mg/dL (ref 0.44–1.00)
GFR, Estimated: >60 mL/min (ref >60)
Glucose, Bld: 124 mg/dL — ABNORMAL HIGH (ref 70–99)
Potassium: 3.7 mmol/L (ref 3.5–5.1)
Sodium: 138 mmol/L (ref 135–145)
Total Bilirubin: 0.4 mg/dL (ref 0.0–1.2)
Total Protein: 6.3 g/dL — ABNORMAL LOW (ref 6.5–8.1)

## 2024-07-31 MED ORDER — FAMOTIDINE IN NACL 20-0.9 MG/50ML-% IV SOLN
20.0000 mg | Freq: Once | INTRAVENOUS | Status: AC
  Administered 2024-07-31: 20 mg via INTRAVENOUS
  Filled 2024-07-31: qty 50

## 2024-07-31 MED ORDER — DIPHENHYDRAMINE HCL 50 MG/ML IJ SOLN
50.0000 mg | Freq: Once | INTRAMUSCULAR | Status: AC
  Administered 2024-07-31: 50 mg via INTRAVENOUS
  Filled 2024-07-31: qty 1

## 2024-07-31 MED ORDER — DEXAMETHASONE SOD PHOSPHATE PF 10 MG/ML IJ SOLN
10.0000 mg | Freq: Once | INTRAMUSCULAR | Status: AC
  Administered 2024-07-31: 10 mg via INTRAVENOUS

## 2024-07-31 MED ORDER — SODIUM CHLORIDE 0.9 % IV SOLN
INTRAVENOUS | Status: DC
  Filled 2024-07-31: qty 250

## 2024-07-31 MED ORDER — SODIUM CHLORIDE 0.9 % IV SOLN
80.0000 mg/m2 | Freq: Once | INTRAVENOUS | Status: AC
  Administered 2024-07-31: 168 mg via INTRAVENOUS
  Filled 2024-07-31: qty 28

## 2024-07-31 NOTE — Patient Instructions (Signed)
 [facility] - A DEPT OF [facility]  Discharge Instructions: Thank you for choosing [facility] to provide your oncology and hematology care.  If you have a lab appointment with the Cancer Center, please go directly to the Cancer Center and check in at the registration area.  Wear comfortable clothing and clothing appropriate for easy access to any Portacath or PICC line.   We strive to give you quality time with your provider. You may need to reschedule your appointment if you arrive late (15 or more minutes).  Arriving late affects you and other patients whose appointments are after yours.  Also, if you miss three or more appointments without notifying the office, you may be dismissed from the clinic at the provider's discretion.      For prescription refill requests, have your pharmacy contact our office and allow 72 hours for refills to be completed.    Today you received the following chemotherapy and/or immunotherapy agents TAXOL        To help prevent nausea and vomiting after your treatment, we encourage you to take your nausea medication as directed.  BELOW ARE SYMPTOMS THAT SHOULD BE REPORTED IMMEDIATELY: *FEVER GREATER THAN 100.4 F (38 C) OR HIGHER *CHILLS OR SWEATING *NAUSEA AND VOMITING THAT IS NOT CONTROLLED WITH YOUR NAUSEA MEDICATION *UNUSUAL SHORTNESS OF BREATH *UNUSUAL BRUISING OR BLEEDING *URINARY PROBLEMS (pain or burning when urinating, or frequent urination) *BOWEL PROBLEMS (unusual diarrhea, constipation, pain near the anus) TENDERNESS IN MOUTH AND THROAT WITH OR WITHOUT PRESENCE OF ULCERS (sore throat, sores in mouth, or a toothache) UNUSUAL RASH, SWELLING OR PAIN  UNUSUAL VAGINAL DISCHARGE OR ITCHING   Items with * indicate a potential emergency and should be followed up as soon as possible or go to the Emergency Department if any problems should occur.  Please show the CHEMOTHERAPY ALERT CARD or IMMUNOTHERAPY  ALERT CARD at check-in to the Emergency Department and triage nurse.  Should you have questions after your visit or need to cancel or reschedule your appointment, please contact [facility] - A DEPT OF [facility]  [phone] and follow the prompts.  Office hours are 8:00 a.m. to 4:30 p.m. Monday - Friday. Please note that voicemails left after 4:00 p.m. may not be returned until the following business day.  We are closed weekends and major holidays. You have access to a nurse at all times for urgent questions. Please call the main number to the clinic [phone] and follow the prompts.  For any non-urgent questions, you may also contact your provider using MyChart. We now offer e-Visits for anyone 98 and older to request care online for non-urgent symptoms. For details visit [email or web address].   Also download the MyChart app! Go to the app store, search "MyChart", open the app, select [facility], and log in with your MyChart username and password.  Paclitaxel  Injection What is this medication? PACLITAXEL  (PAK li TAX el) treats some types of cancer. It works by slowing down the growth of cancer cells. This medicine may be used for other purposes; ask your health care provider or pharmacist if you have questions. COMMON BRAND NAME(S): Onxol, Taxol  What should I tell my care team before I take this medication? They need to know if you have any of these conditions: Heart disease Liver disease Low white blood cell levels An unusual or allergic reaction to paclitaxel , other medications, foods, dyes, or preservatives If you  or your partner are pregnant or trying to get pregnant Breast-feeding How should I use this medication? This medication is injected into a vein. It is given by your care team in a hospital or clinic setting. Talk to your care team about the use of this medication in children. While it may be given to children for selected conditions,  precautions do apply. Overdosage: If you think you have taken too much of this medicine contact a poison control center or emergency room at once. NOTE: This medicine is only for you. Do not share this medicine with others. What if I miss a dose? Keep appointments for follow-up doses. It is important not to miss your dose. Call your care team if you are unable to keep an appointment. What may interact with this medication? Do not take this medication with any of the following: Live virus vaccines Other medications may affect the way this medication works. Talk with your care team about all of the medications you take. They may suggest changes to your treatment plan to lower the risk of side effects and to make sure your medications work as intended. This list may not describe all possible interactions. Give your health care provider a list of all the medicines, herbs, non-prescription drugs, or dietary supplements you use. Also tell them if you smoke, drink alcohol, or use illegal drugs. Some items may interact with your medicine. What should I watch for while using this medication? Your condition will be monitored carefully while you are receiving this medication. You may need blood work while taking this medication. This medication may make you feel generally unwell. This is not uncommon as chemotherapy can affect healthy cells as well as cancer cells. Report any side effects. Continue your course of treatment even though you feel ill unless your care team tells you to stop. This medication can cause serious allergic reactions. To reduce the risk, your care team may give you other medications to take before receiving this one. Be sure to follow the directions from your care team. This medication may increase your risk of getting an infection. Call your care team for advice if you get a fever, chills, sore throat, or other symptoms of a cold or flu. Do not treat yourself. Try to avoid being around  people who are sick. This medication may increase your risk to bruise or bleed. Call your care team if you notice any unusual bleeding. Be careful brushing or flossing your teeth or using a toothpick because you may get an infection or bleed more easily. If you have any dental work done, tell your dentist you are receiving this medication. Talk to your care team if you may be pregnant. Serious birth defects can occur if you take this medication during pregnancy. Talk to your care team before breastfeeding. Changes to your treatment plan may be needed. What side effects may I notice from receiving this medication? Side effects that you should report to your care team as soon as possible: Allergic reactions--skin rash, itching, hives, swelling of the face, lips, tongue, or throat Heart rhythm changes--fast or irregular heartbeat, dizziness, feeling faint or lightheaded, chest pain, trouble breathing Increase in blood pressure Infection--fever, chills, cough, sore throat, wounds that don't heal, pain or trouble when passing urine, general feeling of discomfort or being unwell Low blood pressure--dizziness, feeling faint or lightheaded, blurry vision Low red blood cell level--unusual weakness or fatigue, dizziness, headache, trouble breathing Painful swelling, warmth, or redness of the skin, blisters or sores  at the infusion site Pain, tingling, or numbness in the hands or feet Slow heartbeat--dizziness, feeling faint or lightheaded, confusion, trouble breathing, unusual weakness or fatigue Unusual bruising or bleeding Side effects that usually do not require medical attention (report to your care team if they continue or are bothersome): Diarrhea Hair loss Joint pain Loss of appetite Muscle pain Nausea Vomiting This list may not describe all possible side effects. Call your doctor for medical advice about side effects. You may report side effects to FDA at 1-800-FDA-1088. Where should I keep  my medication? This medication is given in a hospital or clinic. It will not be stored at home. NOTE: This sheet is a summary. It may not cover all possible information. If you have questions about this medicine, talk to your doctor, pharmacist, or health care provider.  2024 [employer]/Gold Standard (2022-01-18 00:00:00)
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
## 2024-07-31 NOTE — Progress Notes (Signed)
 Patient has a couple of questions she would like to address.

## 2024-08-01 ENCOUNTER — Other Ambulatory Visit: Payer: Self-pay

## 2024-08-04 ENCOUNTER — Telehealth: Payer: Self-pay | Admitting: Oncology

## 2024-08-04 NOTE — Telephone Encounter (Signed)
 Patient complaints knee and ankle pain after taxol  treatment.  She takes PRN oxycodone  and tylenol  with some symptom relief.  Recommend pt to continue pain regimen.

## 2024-08-05 ENCOUNTER — Other Ambulatory Visit: Payer: Self-pay

## 2024-08-06 ENCOUNTER — Encounter: Payer: Self-pay | Admitting: Oncology

## 2024-08-07 ENCOUNTER — Inpatient Hospital Stay

## 2024-08-07 VITALS — BP 132/59 | HR 69 | Temp 98.7°F | Resp 16 | Ht 66.0 in | Wt 205.7 lb

## 2024-08-07 DIAGNOSIS — C50411 Malignant neoplasm of upper-outer quadrant of right female breast: Secondary | ICD-10-CM

## 2024-08-07 DIAGNOSIS — D6481 Anemia due to antineoplastic chemotherapy: Secondary | ICD-10-CM | POA: Diagnosis not present

## 2024-08-07 DIAGNOSIS — G8929 Other chronic pain: Secondary | ICD-10-CM | POA: Diagnosis not present

## 2024-08-07 DIAGNOSIS — Z5111 Encounter for antineoplastic chemotherapy: Secondary | ICD-10-CM | POA: Diagnosis not present

## 2024-08-07 DIAGNOSIS — M549 Dorsalgia, unspecified: Secondary | ICD-10-CM | POA: Diagnosis not present

## 2024-08-07 DIAGNOSIS — Z170 Estrogen receptor positive status [ER+]: Secondary | ICD-10-CM | POA: Diagnosis not present

## 2024-08-07 DIAGNOSIS — E039 Hypothyroidism, unspecified: Secondary | ICD-10-CM | POA: Diagnosis not present

## 2024-08-07 DIAGNOSIS — Z5189 Encounter for other specified aftercare: Secondary | ICD-10-CM | POA: Diagnosis not present

## 2024-08-07 DIAGNOSIS — E785 Hyperlipidemia, unspecified: Secondary | ICD-10-CM | POA: Diagnosis not present

## 2024-08-07 LAB — CBC WITH DIFFERENTIAL (CANCER CENTER ONLY)
Abs Immature Granulocytes: 0.08 K/uL — ABNORMAL HIGH (ref 0.00–0.07)
Basophils Absolute: 0.1 K/uL (ref 0.0–0.1)
Basophils Relative: 1 %
Eosinophils Absolute: 0.1 K/uL (ref 0.0–0.5)
Eosinophils Relative: 2 %
HCT: 29.4 % — ABNORMAL LOW (ref 36.0–46.0)
Hemoglobin: 10 g/dL — ABNORMAL LOW (ref 12.0–15.0)
Immature Granulocytes: 1 %
Lymphocytes Relative: 12 %
Lymphs Abs: 0.7 K/uL (ref 0.7–4.0)
MCH: 31.8 pg (ref 26.0–34.0)
MCHC: 34 g/dL (ref 30.0–36.0)
MCV: 93.6 fL (ref 80.0–100.0)
Monocytes Absolute: 0.6 K/uL (ref 0.1–1.0)
Monocytes Relative: 11 %
Neutro Abs: 4.2 K/uL (ref 1.7–7.7)
Neutrophils Relative %: 73 %
Platelet Count: 230 K/uL (ref 150–400)
RBC: 3.14 MIL/uL — ABNORMAL LOW (ref 3.87–5.11)
RDW: 16.9 % — ABNORMAL HIGH (ref 11.5–15.5)
WBC Count: 5.7 K/uL (ref 4.0–10.5)
nRBC: 0 % (ref 0.0–0.2)

## 2024-08-07 LAB — CMP (CANCER CENTER ONLY)
ALT: 18 U/L (ref 0–44)
AST: 23 U/L (ref 15–41)
Albumin: 3.6 g/dL (ref 3.5–5.0)
Alkaline Phosphatase: 66 U/L (ref 38–126)
Anion gap: 10 (ref 5–15)
BUN: 18 mg/dL (ref 8–23)
CO2: 25 mmol/L (ref 22–32)
Calcium: 8.8 mg/dL — ABNORMAL LOW (ref 8.9–10.3)
Chloride: 102 mmol/L (ref 98–111)
Creatinine: 0.71 mg/dL (ref 0.44–1.00)
GFR, Estimated: >60 mL/min (ref >60)
Glucose, Bld: 145 mg/dL — ABNORMAL HIGH (ref 70–99)
Potassium: 3.4 mmol/L — ABNORMAL LOW (ref 3.5–5.1)
Sodium: 137 mmol/L (ref 135–145)
Total Bilirubin: 0.5 mg/dL (ref 0.0–1.2)
Total Protein: 6.3 g/dL — ABNORMAL LOW (ref 6.5–8.1)

## 2024-08-07 MED ORDER — FAMOTIDINE IN NACL 20-0.9 MG/50ML-% IV SOLN
20.0000 mg | Freq: Once | INTRAVENOUS | Status: AC
  Administered 2024-08-07: 20 mg via INTRAVENOUS
  Filled 2024-08-07: qty 50

## 2024-08-07 MED ORDER — SODIUM CHLORIDE 0.9 % IV SOLN
80.0000 mg/m2 | Freq: Once | INTRAVENOUS | Status: AC
  Administered 2024-08-07: 168 mg via INTRAVENOUS
  Filled 2024-08-07: qty 28

## 2024-08-07 MED ORDER — DEXAMETHASONE SOD PHOSPHATE PF 10 MG/ML IJ SOLN
10.0000 mg | Freq: Once | INTRAMUSCULAR | Status: AC
  Administered 2024-08-07: 10 mg via INTRAVENOUS

## 2024-08-07 MED ORDER — DIPHENHYDRAMINE HCL 50 MG/ML IJ SOLN
50.0000 mg | Freq: Once | INTRAMUSCULAR | Status: AC
  Administered 2024-08-07: 50 mg via INTRAVENOUS
  Filled 2024-08-07: qty 1

## 2024-08-07 MED ORDER — SODIUM CHLORIDE 0.9 % IV SOLN
INTRAVENOUS | Status: DC
  Filled 2024-08-07: qty 250

## 2024-08-07 NOTE — Progress Notes (Signed)
 Nutrition Follow-up:  Patient with breast cancer.  Receiving taxol .  Has completed AC.  Met with patient during infusion.  Reports that taste of some foods being effected but not too bad.  Patient tends not to want sweet foods at this time. Reports some nausea, sinus drainage may have effected this.  Took nausea medication with relief.  Yesterday able to eating protein bar, coffeecake, Fairlife protein shake.  Neighbor brought her a plate of turkey, mashed potatoes, corm and lima beans, green beans and she ate off this 2 different times yesterday.  Drinking milk, 1 bottle of water  (does not like), pepsi.      Medications: reviewed  Labs: reviewed  Anthropometrics:   Weight 205 lb 11 oz today  217 lb on 11/5 207 lb UBW   NUTRITION DIAGNOSIS: Inadequate oral intake - stable   INTERVENTION:  Discussed more savory options.   Written information provided about Metaqil rinse to help with taste change.  Continue protein rich foods and fluids.     MONITORING, EVALUATION, GOAL: weight trends, intake   NEXT VISIT: as needed  Anselmo Reihl B. Dasie SOLON, CSO, LDN Registered Dietitian [phone]

## 2024-08-07 NOTE — Patient Instructions (Signed)
 [facility] - A DEPT OF [facility]  Discharge Instructions: Thank you for choosing [facility] to provide your oncology and hematology care.  If you have a lab appointment with the Cancer Center, please go directly to the Cancer Center and check in at the registration area.  Wear comfortable clothing and clothing appropriate for easy access to any Portacath or PICC line.   We strive to give you quality time with your provider. You may need to reschedule your appointment if you arrive late (15 or more minutes).  Arriving late affects you and other patients whose appointments are after yours.  Also, if you miss three or more appointments without notifying the office, you may be dismissed from the clinic at the provider's discretion.      For prescription refill requests, have your pharmacy contact our office and allow 72 hours for refills to be completed.    Today you received the following chemotherapy and/or immunotherapy agents TAXOL        To help prevent nausea and vomiting after your treatment, we encourage you to take your nausea medication as directed.  BELOW ARE SYMPTOMS THAT SHOULD BE REPORTED IMMEDIATELY: *FEVER GREATER THAN 100.4 F (38 C) OR HIGHER *CHILLS OR SWEATING *NAUSEA AND VOMITING THAT IS NOT CONTROLLED WITH YOUR NAUSEA MEDICATION *UNUSUAL SHORTNESS OF BREATH *UNUSUAL BRUISING OR BLEEDING *URINARY PROBLEMS (pain or burning when urinating, or frequent urination) *BOWEL PROBLEMS (unusual diarrhea, constipation, pain near the anus) TENDERNESS IN MOUTH AND THROAT WITH OR WITHOUT PRESENCE OF ULCERS (sore throat, sores in mouth, or a toothache) UNUSUAL RASH, SWELLING OR PAIN  UNUSUAL VAGINAL DISCHARGE OR ITCHING   Items with * indicate a potential emergency and should be followed up as soon as possible or go to the Emergency Department if any problems should occur.  Please show the CHEMOTHERAPY ALERT CARD or IMMUNOTHERAPY  ALERT CARD at check-in to the Emergency Department and triage nurse.  Should you have questions after your visit or need to cancel or reschedule your appointment, please contact [facility] - A DEPT OF [facility]  [phone] and follow the prompts.  Office hours are 8:00 a.m. to 4:30 p.m. Monday - Friday. Please note that voicemails left after 4:00 p.m. may not be returned until the following business day.  We are closed weekends and major holidays. You have access to a nurse at all times for urgent questions. Please call the main number to the clinic [phone] and follow the prompts.  For any non-urgent questions, you may also contact your provider using MyChart. We now offer e-Visits for anyone 98 and older to request care online for non-urgent symptoms. For details visit [email or web address].   Also download the MyChart app! Go to the app store, search "MyChart", open the app, select [facility], and log in with your MyChart username and password.  Paclitaxel  Injection What is this medication? PACLITAXEL  (PAK li TAX el) treats some types of cancer. It works by slowing down the growth of cancer cells. This medicine may be used for other purposes; ask your health care provider or pharmacist if you have questions. COMMON BRAND NAME(S): Onxol, Taxol  What should I tell my care team before I take this medication? They need to know if you have any of these conditions: Heart disease Liver disease Low white blood cell levels An unusual or allergic reaction to paclitaxel , other medications, foods, dyes, or preservatives If you  or your partner are pregnant or trying to get pregnant Breast-feeding How should I use this medication? This medication is injected into a vein. It is given by your care team in a hospital or clinic setting. Talk to your care team about the use of this medication in children. While it may be given to children for selected conditions,  precautions do apply. Overdosage: If you think you have taken too much of this medicine contact a poison control center or emergency room at once. NOTE: This medicine is only for you. Do not share this medicine with others. What if I miss a dose? Keep appointments for follow-up doses. It is important not to miss your dose. Call your care team if you are unable to keep an appointment. What may interact with this medication? Do not take this medication with any of the following: Live virus vaccines Other medications may affect the way this medication works. Talk with your care team about all of the medications you take. They may suggest changes to your treatment plan to lower the risk of side effects and to make sure your medications work as intended. This list may not describe all possible interactions. Give your health care provider a list of all the medicines, herbs, non-prescription drugs, or dietary supplements you use. Also tell them if you smoke, drink alcohol, or use illegal drugs. Some items may interact with your medicine. What should I watch for while using this medication? Your condition will be monitored carefully while you are receiving this medication. You may need blood work while taking this medication. This medication may make you feel generally unwell. This is not uncommon as chemotherapy can affect healthy cells as well as cancer cells. Report any side effects. Continue your course of treatment even though you feel ill unless your care team tells you to stop. This medication can cause serious allergic reactions. To reduce the risk, your care team may give you other medications to take before receiving this one. Be sure to follow the directions from your care team. This medication may increase your risk of getting an infection. Call your care team for advice if you get a fever, chills, sore throat, or other symptoms of a cold or flu. Do not treat yourself. Try to avoid being around  people who are sick. This medication may increase your risk to bruise or bleed. Call your care team if you notice any unusual bleeding. Be careful brushing or flossing your teeth or using a toothpick because you may get an infection or bleed more easily. If you have any dental work done, tell your dentist you are receiving this medication. Talk to your care team if you may be pregnant. Serious birth defects can occur if you take this medication during pregnancy. Talk to your care team before breastfeeding. Changes to your treatment plan may be needed. What side effects may I notice from receiving this medication? Side effects that you should report to your care team as soon as possible: Allergic reactions--skin rash, itching, hives, swelling of the face, lips, tongue, or throat Heart rhythm changes--fast or irregular heartbeat, dizziness, feeling faint or lightheaded, chest pain, trouble breathing Increase in blood pressure Infection--fever, chills, cough, sore throat, wounds that don't heal, pain or trouble when passing urine, general feeling of discomfort or being unwell Low blood pressure--dizziness, feeling faint or lightheaded, blurry vision Low red blood cell level--unusual weakness or fatigue, dizziness, headache, trouble breathing Painful swelling, warmth, or redness of the skin, blisters or sores  at the infusion site Pain, tingling, or numbness in the hands or feet Slow heartbeat--dizziness, feeling faint or lightheaded, confusion, trouble breathing, unusual weakness or fatigue Unusual bruising or bleeding Side effects that usually do not require medical attention (report to your care team if they continue or are bothersome): Diarrhea Hair loss Joint pain Loss of appetite Muscle pain Nausea Vomiting This list may not describe all possible side effects. Call your doctor for medical advice about side effects. You may report side effects to FDA at 1-800-FDA-1088. Where should I keep  my medication? This medication is given in a hospital or clinic. It will not be stored at home. NOTE: This sheet is a summary. It may not cover all possible information. If you have questions about this medicine, talk to your doctor, pharmacist, or health care provider.  2024 [employer]/Gold Standard (2022-01-18 00:00:00)

## 2024-08-12 ENCOUNTER — Other Ambulatory Visit: Payer: Self-pay

## 2024-08-12 ENCOUNTER — Encounter: Payer: Self-pay | Admitting: *Deleted

## 2024-08-12 DIAGNOSIS — Z170 Estrogen receptor positive status [ER+]: Secondary | ICD-10-CM

## 2024-08-12 MED ORDER — PREDNISONE 10 MG PO TABS: 10.0000 mg | ORAL_TABLET | Freq: Two times a day (BID) | ORAL | 0 refills | Status: DC

## 2024-08-12 NOTE — Progress Notes (Signed)
 Rebecca Patton called asking if there are any alternatives to taking oxycodone  for her Taxol  related pain.   Per Dr. Melanee she can try prednisone  10 mg BID for 5 days starting 24 hours after Taxol  infusion.   Rebecca Patton stated she is willing to try the prednisone .

## 2024-08-14 ENCOUNTER — Encounter: Payer: Self-pay | Admitting: Oncology

## 2024-08-14 ENCOUNTER — Inpatient Hospital Stay

## 2024-08-14 ENCOUNTER — Inpatient Hospital Stay: Attending: Oncology

## 2024-08-14 ENCOUNTER — Inpatient Hospital Stay (HOSPITAL_BASED_OUTPATIENT_CLINIC_OR_DEPARTMENT_OTHER): Admitting: Oncology

## 2024-08-14 ENCOUNTER — Inpatient Hospital Stay: Admitting: Oncology

## 2024-08-14 VITALS — BP 150/69 | HR 72

## 2024-08-14 VITALS — BP 131/60 | HR 76 | Temp 98.8°F | Resp 19 | Ht 66.0 in | Wt 204.1 lb

## 2024-08-14 DIAGNOSIS — Z5111 Encounter for antineoplastic chemotherapy: Secondary | ICD-10-CM | POA: Insufficient documentation

## 2024-08-14 DIAGNOSIS — C50411 Malignant neoplasm of upper-outer quadrant of right female breast: Secondary | ICD-10-CM | POA: Diagnosis not present

## 2024-08-14 DIAGNOSIS — R21 Rash and other nonspecific skin eruption: Secondary | ICD-10-CM | POA: Diagnosis not present

## 2024-08-14 DIAGNOSIS — Z170 Estrogen receptor positive status [ER+]: Secondary | ICD-10-CM

## 2024-08-14 DIAGNOSIS — G620 Drug-induced polyneuropathy: Secondary | ICD-10-CM | POA: Diagnosis not present

## 2024-08-14 DIAGNOSIS — E039 Hypothyroidism, unspecified: Secondary | ICD-10-CM | POA: Insufficient documentation

## 2024-08-14 DIAGNOSIS — E785 Hyperlipidemia, unspecified: Secondary | ICD-10-CM | POA: Insufficient documentation

## 2024-08-14 DIAGNOSIS — T451X5A Adverse effect of antineoplastic and immunosuppressive drugs, initial encounter: Secondary | ICD-10-CM | POA: Diagnosis not present

## 2024-08-14 DIAGNOSIS — J069 Acute upper respiratory infection, unspecified: Secondary | ICD-10-CM | POA: Diagnosis not present

## 2024-08-14 LAB — CMP (CANCER CENTER ONLY)
ALT: 18 U/L (ref 0–44)
AST: 21 U/L (ref 15–41)
Albumin: 3.8 g/dL (ref 3.5–5.0)
Alkaline Phosphatase: 76 U/L (ref 38–126)
Anion gap: 10 (ref 5–15)
BUN: 16 mg/dL (ref 8–23)
CO2: 25 mmol/L (ref 22–32)
Calcium: 9.5 mg/dL (ref 8.9–10.3)
Chloride: 104 mmol/L (ref 98–111)
Creatinine: 0.77 mg/dL (ref 0.44–1.00)
GFR, Estimated: >60 mL/min (ref >60)
Glucose, Bld: 150 mg/dL — ABNORMAL HIGH (ref 70–99)
Potassium: 3.7 mmol/L (ref 3.5–5.1)
Sodium: 139 mmol/L (ref 135–145)
Total Bilirubin: 0.3 mg/dL (ref 0.0–1.2)
Total Protein: 6.2 g/dL — ABNORMAL LOW (ref 6.5–8.1)

## 2024-08-14 LAB — CBC WITH DIFFERENTIAL (CANCER CENTER ONLY)
Abs Immature Granulocytes: 0.05 K/uL (ref 0.00–0.07)
Basophils Absolute: 0.1 K/uL (ref 0.0–0.1)
Basophils Relative: 1 %
Eosinophils Absolute: 0.2 K/uL (ref 0.0–0.5)
Eosinophils Relative: 3 %
HCT: 31 % — ABNORMAL LOW (ref 36.0–46.0)
Hemoglobin: 10.4 g/dL — ABNORMAL LOW (ref 12.0–15.0)
Immature Granulocytes: 1 %
Lymphocytes Relative: 11 %
Lymphs Abs: 0.5 K/uL — ABNORMAL LOW (ref 0.7–4.0)
MCH: 31.9 pg (ref 26.0–34.0)
MCHC: 33.5 g/dL (ref 30.0–36.0)
MCV: 95.1 fL (ref 80.0–100.0)
Monocytes Absolute: 0.5 K/uL (ref 0.1–1.0)
Monocytes Relative: 10 %
Neutro Abs: 3.7 K/uL (ref 1.7–7.7)
Neutrophils Relative %: 74 %
Platelet Count: 239 K/uL (ref 150–400)
RBC: 3.26 MIL/uL — ABNORMAL LOW (ref 3.87–5.11)
RDW: 16.6 % — ABNORMAL HIGH (ref 11.5–15.5)
WBC Count: 5 K/uL (ref 4.0–10.5)
nRBC: 0 % (ref 0.0–0.2)

## 2024-08-14 MED ORDER — SODIUM CHLORIDE 0.9 % IV SOLN
80.0000 mg/m2 | Freq: Once | INTRAVENOUS | Status: DC
  Filled 2024-08-14: qty 28

## 2024-08-14 MED ORDER — SODIUM CHLORIDE 0.9 % IV SOLN
60.0000 mg/m2 | Freq: Once | INTRAVENOUS | Status: AC
  Administered 2024-08-14: 126 mg via INTRAVENOUS
  Filled 2024-08-14: qty 21

## 2024-08-14 MED ORDER — SODIUM CHLORIDE 0.9 % IV SOLN
INTRAVENOUS | Status: DC
  Filled 2024-08-14: qty 250

## 2024-08-14 MED ORDER — DIPHENHYDRAMINE HCL 50 MG/ML IJ SOLN
50.0000 mg | Freq: Once | INTRAMUSCULAR | Status: AC
  Administered 2024-08-14: 50 mg via INTRAVENOUS
  Filled 2024-08-14: qty 1

## 2024-08-14 MED ORDER — FAMOTIDINE IN NACL 20-0.9 MG/50ML-% IV SOLN
20.0000 mg | Freq: Once | INTRAVENOUS | Status: AC
  Administered 2024-08-14: 20 mg via INTRAVENOUS
  Filled 2024-08-14: qty 50

## 2024-08-14 MED ORDER — DEXAMETHASONE SOD PHOSPHATE PF 10 MG/ML IJ SOLN
10.0000 mg | Freq: Once | INTRAMUSCULAR | Status: AC
  Administered 2024-08-14: 10 mg via INTRAVENOUS

## 2024-08-14 MED ORDER — LIDOCAINE-PRILOCAINE 2.5-2.5 % EX CREA: TOPICAL_CREAM | CUTANEOUS | 3 refills | Status: DC

## 2024-08-14 NOTE — Progress Notes (Signed)
 Patient needs a refill on port cream.

## 2024-08-14 NOTE — Patient Instructions (Signed)
 [facility] - A DEPT OF [facility]  Discharge Instructions: Thank you for choosing [facility] to provide your oncology and hematology care.  If you have a lab appointment with the Cancer Center, please go directly to the Cancer Center and check in at the registration area.  Wear comfortable clothing and clothing appropriate for easy access to any Portacath or PICC line.   We strive to give you quality time with your provider. You may need to reschedule your appointment if you arrive late (15 or more minutes).  Arriving late affects you and other patients whose appointments are after yours.  Also, if you miss three or more appointments without notifying the office, you may be dismissed from the clinic at the provider's discretion.      For prescription refill requests, have your pharmacy contact our office and allow 72 hours for refills to be completed.    Today you received the following chemotherapy and/or immunotherapy agents Taxol.      To help prevent nausea and vomiting after your treatment, we encourage you to take your nausea medication as directed.  BELOW ARE SYMPTOMS THAT SHOULD BE REPORTED IMMEDIATELY: *FEVER GREATER THAN 100.4 F (38 C) OR HIGHER *CHILLS OR SWEATING *NAUSEA AND VOMITING THAT IS NOT CONTROLLED WITH YOUR NAUSEA MEDICATION *UNUSUAL SHORTNESS OF BREATH *UNUSUAL BRUISING OR BLEEDING *URINARY PROBLEMS (pain or burning when urinating, or frequent urination) *BOWEL PROBLEMS (unusual diarrhea, constipation, pain near the anus) TENDERNESS IN MOUTH AND THROAT WITH OR WITHOUT PRESENCE OF ULCERS (sore throat, sores in mouth, or a toothache) UNUSUAL RASH, SWELLING OR PAIN  UNUSUAL VAGINAL DISCHARGE OR ITCHING   Items with * indicate a potential emergency and should be followed up as soon as possible or go to the Emergency Department if any problems should occur.  Please show the CHEMOTHERAPY ALERT CARD or IMMUNOTHERAPY ALERT  CARD at check-in to the Emergency Department and triage nurse.  Should you have questions after your visit or need to cancel or reschedule your appointment, please contact [facility] - A DEPT OF [facility]  [phone] and follow the prompts.  Office hours are 8:00 a.m. to 4:30 p.m. Monday - Friday. Please note that voicemails left after 4:00 p.m. may not be returned until the following business day.  We are closed weekends and major holidays. You have access to a nurse at all times for urgent questions. Please call the main number to the clinic [phone] and follow the prompts.  For any non-urgent questions, you may also contact your provider using MyChart. We now offer e-Visits for anyone 82 and older to request care online for non-urgent symptoms. For details visit [email or web address].   Also download the MyChart app! Go to the app store, search "MyChart", open the app, select [facility], and log in with your MyChart username and password.
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
## 2024-08-15 ENCOUNTER — Other Ambulatory Visit: Payer: Self-pay

## 2024-08-21 ENCOUNTER — Inpatient Hospital Stay

## 2024-08-21 ENCOUNTER — Encounter: Payer: Self-pay | Admitting: Oncology

## 2024-08-21 ENCOUNTER — Other Ambulatory Visit: Payer: Self-pay | Admitting: *Deleted

## 2024-08-21 ENCOUNTER — Inpatient Hospital Stay (HOSPITAL_BASED_OUTPATIENT_CLINIC_OR_DEPARTMENT_OTHER): Admitting: Hospice and Palliative Medicine

## 2024-08-21 VITALS — BP 137/68 | HR 82 | Temp 100.5°F | Resp 18 | Wt 204.1 lb

## 2024-08-21 DIAGNOSIS — J069 Acute upper respiratory infection, unspecified: Secondary | ICD-10-CM

## 2024-08-21 DIAGNOSIS — C50411 Malignant neoplasm of upper-outer quadrant of right female breast: Secondary | ICD-10-CM

## 2024-08-21 DIAGNOSIS — R110 Nausea: Secondary | ICD-10-CM

## 2024-08-21 DIAGNOSIS — Z5111 Encounter for antineoplastic chemotherapy: Secondary | ICD-10-CM | POA: Diagnosis not present

## 2024-08-21 LAB — CMP (CANCER CENTER ONLY)
ALT: 17 U/L (ref 0–44)
AST: 18 U/L (ref 15–41)
Albumin: 4 g/dL (ref 3.5–5.0)
Alkaline Phosphatase: 80 U/L (ref 38–126)
Anion gap: 12 (ref 5–15)
BUN: 31 mg/dL — ABNORMAL HIGH (ref 8–23)
CO2: 24 mmol/L (ref 22–32)
Calcium: 9.4 mg/dL (ref 8.9–10.3)
Chloride: 101 mmol/L (ref 98–111)
Creatinine: 0.74 mg/dL (ref 0.44–1.00)
GFR, Estimated: >60 mL/min (ref >60)
Glucose, Bld: 104 mg/dL — ABNORMAL HIGH (ref 70–99)
Potassium: 3.7 mmol/L (ref 3.5–5.1)
Sodium: 137 mmol/L (ref 135–145)
Total Bilirubin: 0.3 mg/dL (ref 0.0–1.2)
Total Protein: 6.1 g/dL — ABNORMAL LOW (ref 6.5–8.1)

## 2024-08-21 LAB — CBC WITH DIFFERENTIAL (CANCER CENTER ONLY)
Abs Immature Granulocytes: 0.09 K/uL — ABNORMAL HIGH (ref 0.00–0.07)
Basophils Absolute: 0.1 K/uL (ref 0.0–0.1)
Basophils Relative: 1 %
Eosinophils Absolute: 0.2 K/uL (ref 0.0–0.5)
Eosinophils Relative: 2 %
HCT: 33.2 % — ABNORMAL LOW (ref 36.0–46.0)
Hemoglobin: 11.2 g/dL — ABNORMAL LOW (ref 12.0–15.0)
Immature Granulocytes: 1 %
Lymphocytes Relative: 7 %
Lymphs Abs: 0.5 K/uL — ABNORMAL LOW (ref 0.7–4.0)
MCH: 32.7 pg (ref 26.0–34.0)
MCHC: 33.7 g/dL (ref 30.0–36.0)
MCV: 96.8 fL (ref 80.0–100.0)
Monocytes Absolute: 0.9 K/uL (ref 0.1–1.0)
Monocytes Relative: 12 %
Neutro Abs: 5.4 K/uL (ref 1.7–7.7)
Neutrophils Relative %: 77 %
Platelet Count: 212 K/uL (ref 150–400)
RBC: 3.43 MIL/uL — ABNORMAL LOW (ref 3.87–5.11)
RDW: 16.1 % — ABNORMAL HIGH (ref 11.5–15.5)
WBC Count: 7.1 K/uL (ref 4.0–10.5)
nRBC: 0 % (ref 0.0–0.2)

## 2024-08-21 LAB — RESPIRATORY PANEL BY PCR
Adenovirus: NOT DETECTED
Bordetella Parapertussis: NOT DETECTED
Bordetella pertussis: NOT DETECTED
Chlamydophila pneumoniae: NOT DETECTED
Coronavirus 229E: NOT DETECTED
Coronavirus HKU1: NOT DETECTED
Coronavirus NL63: NOT DETECTED
Coronavirus OC43: NOT DETECTED
Influenza A: NOT DETECTED
Influenza B: NOT DETECTED
Metapneumovirus: NOT DETECTED
Mycoplasma pneumoniae: NOT DETECTED
Parainfluenza Virus 1: DETECTED — AB
Parainfluenza Virus 2: NOT DETECTED
Parainfluenza Virus 3: NOT DETECTED
Parainfluenza Virus 4: NOT DETECTED
Respiratory Syncytial Virus: NOT DETECTED
Rhinovirus / Enterovirus: DETECTED — AB

## 2024-08-21 MED ORDER — DEXAMETHASONE SOD PHOSPHATE PF 10 MG/ML IJ SOLN
10.0000 mg | Freq: Once | INTRAMUSCULAR | Status: AC
  Administered 2024-08-21: 10 mg via INTRAVENOUS

## 2024-08-21 MED ORDER — FAMOTIDINE IN NACL 20-0.9 MG/50ML-% IV SOLN
20.0000 mg | Freq: Once | INTRAVENOUS | Status: AC
  Administered 2024-08-21: 20 mg via INTRAVENOUS
  Filled 2024-08-21: qty 50

## 2024-08-21 MED ORDER — PREDNISONE 10 MG PO TABS: 10.0000 mg | ORAL_TABLET | Freq: Two times a day (BID) | ORAL | 0 refills | Status: DC

## 2024-08-21 MED ORDER — SODIUM CHLORIDE 0.9 % IV SOLN
INTRAVENOUS | Status: DC
  Filled 2024-08-21: qty 250

## 2024-08-21 MED ORDER — DIPHENHYDRAMINE HCL 50 MG/ML IJ SOLN
50.0000 mg | Freq: Once | INTRAMUSCULAR | Status: AC
  Administered 2024-08-21: 50 mg via INTRAVENOUS
  Filled 2024-08-21: qty 1

## 2024-08-21 MED ORDER — ONDANSETRON HCL 4 MG/2ML IJ SOLN
8.0000 mg | Freq: Once | INTRAMUSCULAR | Status: AC
  Administered 2024-08-21: 8 mg via INTRAVENOUS

## 2024-08-21 MED ORDER — SODIUM CHLORIDE 0.9 % IV SOLN
60.0000 mg/m2 | Freq: Once | INTRAVENOUS | Status: AC
  Administered 2024-08-21: 126 mg via INTRAVENOUS
  Filled 2024-08-21: qty 21

## 2024-08-21 NOTE — Progress Notes (Signed)
 Symptom Management Clinic [facility] at [facility] [phone] [phone]  Patient Care Team: Bertrum Charlie CROME, MD as PCP - General (Family Medicine) [name], OD as Consulting Physician (Optometry) Marcus, Delaine SAILOR, CNM (Inactive) as Midwife (Obstetrics and Gynecology) Cathlyn Seal, MD (Dermatology) Aundria Ladell POUR, MD as Consulting Physician (Gastroenterology) [name], RN as Oncology Nurse Navigator [name], MD as Consulting Physician (Oncology)   NAME OF PATIENT: Rebecca Patton  [id number]  Oct 25, 1945   DATE OF VISIT: 08/21/24  REASON FOR CONSULT: MAYVIS AGUDELO is a 78 y.o. 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   INTERVAL HISTORY: Patient here for weekly Taxol  chemotherapy.  She was an add-on to [facility] for evaluation of upper respiratory symptoms and low-grade fever.  Patient complains of 2 days of rhinorrhea, postnasal drip, sore throat, and intermittent productive cough.  She has not felt feverish or had chills.  Denies muscle aches or bodyaches.  Has felt fatigued.  Also endorses bilateral watery discharge from eyes.  No known sick contacts.  Denies any neurologic complaints. Denies any easy bleeding or bruising. Reports fair appetite and denies weight loss. Denies chest pain. Denies any nausea, vomiting, constipation, or diarrhea. Denies urinary complaints. Patient offers no further specific complaints today.  PAST MEDICAL HISTORY: Past Medical History:  Diagnosis Date   Anxiety    Arthritis    COVID-19 09/2020   Depression    GERD (gastroesophageal reflux disease)    High blood pressure    History of kidney stones    seen on PET scan   Hyperlipidemia    Hypothyroidism    Malignant neoplasm of upper-outer quadrant of right breast, estrogen receptor positive (HCC)    MRSA infection 2006   on nose   Pneumonia 10/13/2023    Pre-diabetes    Supraumbilical hernia    Symptomatic cholelithiasis    Uterine cancer (HCC) 2009    PAST SURGICAL HISTORY:  Past Surgical History:  Procedure Laterality Date   ABDOMINAL HYSTERECTOMY  09/13/2007   BSO   AXILLARY LYMPH NODE DISSECTION Right 03/13/2024   Procedure: REDGIE HARD;  Surgeon: Rodolph Romano, MD;  Location: [facility];  Service: General;  Laterality: Right;   AXILLARY SENTINEL NODE BIOPSY Right 02/19/2024   Procedure: BIOPSY, LYMPH NODE, SENTINEL, AXILLARY;  Surgeon: Rodolph Romano, MD;  Location: [facility];  Service: General;  Laterality: Right;   BREAST BIOPSY Right 01/24/2024   US  RT BREAST BX W LOC DEV EA ADD LESION IMG BX SPEC US  GUIDE 01/24/2024 [facility]   BREAST BIOPSY Right 01/24/2024   US  RT BREAST BX W LOC DEV 1ST LESION IMG BX SPEC US  GUIDE 01/24/2024 [facility]   BREAST BIOPSY Right 01/24/2024   US  RT BREAST BX W LOC DEV EA ADD LESION IMG BX SPEC US  GUIDE 01/24/2024 [facility]   BREAST BIOPSY Right 02/19/2024   MM RT PLC BREAST LOC DEV   1ST LESION  INC MAMMO GUIDE 02/19/2024 [facility]   BREAST LUMPECTOMY Right 02/19/2024   Procedure: BREAST LUMPECTOMY;  Surgeon: Rodolph Romano, MD;  Location: [facility];  Service: General;  Laterality: Right;  with needle localization   BREAST LUMPECTOMY Right 03/13/2024   Procedure: BREAST LUMPECTOMY;  Surgeon: Rodolph Romano, MD;  Location: [facility];  Service: General;  Laterality: Right;   BUNIONECTOMY     CHOLECYSTECTOMY     COLONOSCOPY WITH PROPOFOL  N/A 01/23/2020   Procedure: COLONOSCOPY WITH PROPOFOL ;  Surgeon:  Toledo, Ladell POUR, MD;  Location: [facility];  Service: Gastroenterology;  Laterality: N/A;   EXCISION NEUROMA     INSERTION OF MESH N/A 05/18/2018   Procedure: INSERTION OF MESH;  Surgeon: Nicholaus Selinda Birmingham, MD;  Location: [facility];  Service: General;  Laterality: N/A;   NASAL SINUS SURGERY     PORTACATH PLACEMENT Left 05/14/2024   Procedure:  INSERTION, TUNNELED CENTRAL VENOUS DEVICE, WITH PORT;  Surgeon: Rodolph Romano, MD;  Location: [facility];  Service: General;  Laterality: Left;   TUBAL LIGATION     UMBILICAL HERNIA REPAIR N/A 05/18/2018   Procedure: HERNIA REPAIR SUPRA-UMBILICAL ADULT;  Surgeon: Nicholaus Selinda Birmingham, MD;  Location: [facility];  Service: General;  Laterality: N/A;    HEMATOLOGY/ONCOLOGY HISTORY:  Oncology History  Malignant neoplasm of upper-outer quadrant of right breast in female, estrogen receptor positive (HCC)  02/01/2024 Initial Diagnosis   Malignant neoplasm of upper-outer quadrant of right breast in female, estrogen receptor positive (HCC)   02/01/2024 Cancer Staging   Staging form: Breast, AJCC 8th Edition - Clinical stage from 02/01/2024: Stage IB (cT2, cN0, cM0, G2, ER+, PR+, HER2-) - Signed by [name], MD on 02/01/2024 Method of lymph node assessment: Clinical Nuclear grade: G2 Histologic grading system: 3 grade system    Genetic Testing   Negative genetic testing. No pathogenic variants identified on the [facility] [facility]+RNA panel. The report date is 02/14/2024.  The [facility] Cancer[employer]+RNAinsight Panel includes sequencing, rearrangement analysis, and RNA analysis for the following 40 genes: APC, ATM, BAP1, BARD1, BMPR1A, BRCA1, BRCA2, BRIP1, CDH1, CDKN2A, CHEK2, FH, FLCN, MET, MLH1, MSH2, MSH6, MUTYH, NF1, NTHL1, PALB2, PMS2, PTEN, RAD51C, RAD51D, RPS20, SMAD4, STK11, TP53, TSC1, TSC2, and VHL (sequencing and deletion/duplication); AXIN2, HOXB13, MBD4, MSH3, POLD1 and POLE (sequencing only); EPCAM and GREM1 (deletion/duplication only).   02/26/2024 Cancer Staging   Staging form: Breast, AJCC 8th Edition - Pathologic stage from 02/26/2024: Stage IB (pT2, pN2a, cM0, G2, ER+, PR+, HER2-) - Signed by [name], MD on 03/02/2024 Histologic grading system: 3 grade system   05/15/2024 -  Chemotherapy   Patient is on Treatment Plan : BREAST DOSE DENSE AC q14d / PACLitaxel  q7d       ALLERGIES:   has no known allergies.  MEDICATIONS:  Current Outpatient Medications  Medication Sig Dispense Refill   acetaminophen  (TYLENOL ) 500 MG tablet Take 500 mg by mouth every 6 (six) hours as needed for moderate pain (pain score 4-6).     acidophilus (RISAQUAD) CAPS capsule Take 1 capsule by mouth daily.     ASHWAGANDHA PO Take 1 tablet by mouth at bedtime.     aspirin 81 MG tablet Take 81 mg by mouth See admin instructions. Take 81 mg by mouth every 4 days     celecoxib (CELEBREX) 100 MG capsule Take 100 mg by mouth in the morning.     chlorpheniramine-HYDROcodone  (TUSSIONEX) 10-8 MG/5ML Take 5 mLs by mouth at bedtime as needed for cough. (Patient not taking: Reported on 07/31/2024) 140 mL 0   Collagen-Vitamin C-Biotin (COLLAGEN PO) Take 1 Scoop by mouth daily. (Patient not taking: Reported on 07/31/2024)     dexamethasone  (DECADRON ) 4 MG tablet Take 2 tablets (8 mg total) by mouth daily for 3 days. Start the day after doxorubicin /cyclophosphamide  chemotherapy. Take with food. (Patient not taking: Reported on 08/14/2024) 30 tablet 1   fluticasone  (FLONASE ) 50 MCG/ACT nasal spray USE 1 SPRAY IN EACH NOSTRILDAILY 48 g 2   Garlic 1000 MG CAPS Take 1,000 mg  by mouth daily. (Patient not taking: Reported on 07/31/2024)     hydrochlorothiazide  (HYDRODIURIL ) 25 MG tablet TAKE 1 TABLET DAILY. 90 tablet 3   hydrocortisone cream 1 % Apply 1 Application topically daily as needed for itching.     letrozole  (FEMARA ) 2.5 MG tablet Take 1 tablet (2.5 mg total) by mouth daily. (Patient not taking: Reported on 06/26/2024) 30 tablet 3   levothyroxine  (SYNTHROID ) 75 MCG tablet TAKE 1 TABLET DAILY BEFORE BREAKFAST 90 tablet 1   lidocaine -prilocaine  (EMLA ) cream Apply to affected area once 30 g 3   losartan  (COZAAR ) 100 MG tablet TAKE 1 TABLET(100 MG) BY MOUTH DAILY 90 tablet 1   MAGNESIUM GLYCINATE PO Take 300 mg by mouth at bedtime.     melatonin 3 MG TABS tablet Take 3 mg by mouth at bedtime as needed (sleep).      metoprolol  succinate (TOPROL -XL) 25 MG 24 hr tablet TAKE 1 TABLET DAILY. TAKE  WITH OR IMMEDIATELY        FOLLOWING A MEAL. 30 tablet 0   MULTIPLE VITAMIN PO Take 1 tablet by mouth daily. Centrum Silver (Patient not taking: Reported on 07/31/2024)     Nutritional Supplements (FRUIT & VEGETABLE DAILY) CAPS Take 2 capsules by mouth at bedtime. (1) Fruit + (1) Vegetable (Patient not taking: Reported on 07/31/2024)     nystatin  (MYCOSTATIN /NYSTOP ) powder Apply 1 Application topically 3 (three) times daily. 15 g 0   omeprazole  (PRILOSEC) 20 MG capsule TAKE 1 CAPSULE TWICE DAILY BEFORE MEALS (Patient taking differently: Take 20 mg by mouth daily. TAKE 1 CAPSULE TWICE DAILY BEFORE MEALS) 180 capsule 1   ondansetron  (ZOFRAN ) 8 MG tablet Take 1 tab (8 mg) by mouth every 8 hrs as needed for nausea/vomiting. Start third day after doxorubicin /cyclophosphamide  chemotherapy. (Patient not taking: Reported on 07/31/2024) 30 tablet 1   oxyCODONE  (OXY IR/ROXICODONE ) 5 MG immediate release tablet Take 1 tablet (5 mg total) by mouth every 6 (six) hours as needed for severe pain (pain score 7-10). 30 tablet 0   oxymetazoline (AFRIN) 0.05 % nasal spray Place 1 spray into both nostrils 2 (two) times daily as needed for congestion.     predniSONE  (DELTASONE ) 10 MG tablet Take 1 tablet (10 mg total) by mouth 2 (two) times daily with a meal. 10 tablet 0   prochlorperazine  (COMPAZINE ) 10 MG tablet Take 1 tablet (10 mg total) by mouth every 6 (six) hours as needed for nausea or vomiting. 30 tablet 1   Red Yeast Rice Extract (RED YEAST RICE PO) Take 1 capsule by mouth daily. (Patient not taking: Reported on 07/17/2024)     traZODone  (DESYREL ) 50 MG tablet TAKE 1 TABLET BY MOUTH EVERYDAY AT BEDTIME (Patient not taking: Reported on 07/31/2024) 90 tablet 1   TURMERIC PO Take 1,000 mg by mouth at bedtime. (Patient not taking: Reported on 07/31/2024)     vitamin B-12 (CYANOCOBALAMIN ) 100 MCG tablet Take 200 mcg by mouth daily.     No  current facility-administered medications for this visit.    VITAL SIGNS: There were no vitals taken for this visit. There were no vitals filed for this visit.  Estimated body mass index is 32.95 kg/m as calculated from the following:   Height as of 08/14/24: 5' 6 (1.676 m).   Weight as of an earlier encounter on 08/21/24: 204 lb 2.3 oz (92.6 kg).  LABS: CBC:    Component Value Date/Time   WBC 5.0 08/14/2024 0917   WBC 9.0 05/02/2024 1357  HGB 10.4 (L) 08/14/2024 0917   HGB 13.8 06/13/2022 1014   HCT 31.0 (L) 08/14/2024 0917   HCT 42.2 06/13/2022 1014   PLT 239 08/14/2024 0917   PLT 210 06/13/2022 1014   MCV 95.1 08/14/2024 0917   MCV 87 06/13/2022 1014   MCV 91 03/25/2012 2049   NEUTROABS 3.7 08/14/2024 0917   NEUTROABS 4.7 06/13/2022 1014   LYMPHSABS 0.5 (L) 08/14/2024 0917   LYMPHSABS 1.5 06/13/2022 1014   MONOABS 0.5 08/14/2024 0917   EOSABS 0.2 08/14/2024 0917   EOSABS 0.3 06/13/2022 1014   BASOSABS 0.1 08/14/2024 0917   BASOSABS 0.0 06/13/2022 1014   Comprehensive Metabolic Panel:    Component Value Date/Time   NA 139 08/14/2024 0917   NA 145 (H) 06/13/2022 1014   NA 143 03/25/2012 2049   K 3.7 08/14/2024 0917   K 3.6 03/25/2012 2049   CL 104 08/14/2024 0917   CL 106 03/25/2012 2049   CO2 25 08/14/2024 0917   CO2 28 03/25/2012 2049   BUN 16 08/14/2024 0917   BUN 16 06/13/2022 1014   BUN 13 03/25/2012 2049   CREATININE 0.77 08/14/2024 0917   CREATININE 0.89 03/25/2012 2049   GLUCOSE 150 (H) 08/14/2024 0917   GLUCOSE 133 (H) 03/25/2012 2049   CALCIUM 9.5 08/14/2024 0917   CALCIUM 8.8 03/25/2012 2049   AST 21 08/14/2024 0917   ALT 18 08/14/2024 0917   ALT 25 03/25/2012 2049   ALKPHOS 76 08/14/2024 0917   ALKPHOS 92 03/25/2012 2049   BILITOT 0.3 08/14/2024 0917   PROT 6.2 (L) 08/14/2024 0917   PROT 6.4 06/13/2022 1014   PROT 7.8 03/25/2012 2049   ALBUMIN 3.8 08/14/2024 0917   ALBUMIN 3.9 06/13/2022 1014   ALBUMIN 4.0 03/25/2012 2049     RADIOGRAPHIC STUDIES: No results found.  PERFORMANCE STATUS (ECOG) : 1 - Symptomatic but completely ambulatory  Review of Systems Unless otherwise noted, a complete review of systems is negative.  Physical Exam General: NAD HEENT: No exudate, no lymphadenopathy Cardiovascular: regular rate and rhythm Pulmonary: clear anterior/posterior fields Abdomen: soft, nontender, + bowel sounds GU: no suprapubic tenderness Extremities: no edema, no joint deformities Skin: no rashes Neurological: nonfocal  IMPRESSION/PLAN: Breast cancer-on weekly adjuvant Taxol   URI - suspect viral. Will send resp panel.  Discussed symptomatic treatment.  Patient will notify office if symptoms are worsening or fail to improve.  Case and plan discussed with Dr. Melanee   Patient expressed understanding and was in agreement with this plan. She also understands that She can call clinic at any time with any questions, concerns, or complaints.   Thank you for allowing me to participate in the care of this very pleasant patient.   Time Total: 20 minutes  Visit consisted of counseling and education dealing with the complex and emotionally intense issues of symptom management in the setting of serious illness.Greater than 50%  of this time was spent counseling and coordinating care related to the above assessment and plan.  Signed by: Fonda Mower, PhD, NP-C

## 2024-08-28 ENCOUNTER — Encounter: Payer: Self-pay | Admitting: Oncology

## 2024-08-28 ENCOUNTER — Inpatient Hospital Stay: Admitting: Oncology

## 2024-08-28 ENCOUNTER — Inpatient Hospital Stay

## 2024-08-28 ENCOUNTER — Other Ambulatory Visit: Payer: Self-pay

## 2024-08-28 VITALS — BP 138/70 | HR 82 | Temp 98.3°F | Resp 21 | Ht 66.0 in | Wt 201.3 lb

## 2024-08-28 VITALS — BP 152/76 | HR 87 | Resp 18

## 2024-08-28 DIAGNOSIS — Z170 Estrogen receptor positive status [ER+]: Secondary | ICD-10-CM | POA: Diagnosis not present

## 2024-08-28 DIAGNOSIS — T451X5A Adverse effect of antineoplastic and immunosuppressive drugs, initial encounter: Secondary | ICD-10-CM | POA: Diagnosis not present

## 2024-08-28 DIAGNOSIS — C50411 Malignant neoplasm of upper-outer quadrant of right female breast: Secondary | ICD-10-CM

## 2024-08-28 DIAGNOSIS — Z5111 Encounter for antineoplastic chemotherapy: Secondary | ICD-10-CM

## 2024-08-28 DIAGNOSIS — G620 Drug-induced polyneuropathy: Secondary | ICD-10-CM | POA: Diagnosis not present

## 2024-08-28 LAB — CBC WITH DIFFERENTIAL (CANCER CENTER ONLY)
Abs Immature Granulocytes: 0.11 K/uL — ABNORMAL HIGH (ref 0.00–0.07)
Basophils Absolute: 0.1 K/uL (ref 0.0–0.1)
Basophils Relative: 1 %
Eosinophils Absolute: 0.2 K/uL (ref 0.0–0.5)
Eosinophils Relative: 3 %
HCT: 35.9 % — ABNORMAL LOW (ref 36.0–46.0)
Hemoglobin: 12 g/dL (ref 12.0–15.0)
Immature Granulocytes: 1 %
Lymphocytes Relative: 13 %
Lymphs Abs: 1.1 K/uL (ref 0.7–4.0)
MCH: 32.4 pg (ref 26.0–34.0)
MCHC: 33.4 g/dL (ref 30.0–36.0)
MCV: 97 fL (ref 80.0–100.0)
Monocytes Absolute: 0.7 K/uL (ref 0.1–1.0)
Monocytes Relative: 8 %
Neutro Abs: 6.5 K/uL (ref 1.7–7.7)
Neutrophils Relative %: 74 %
Platelet Count: 220 K/uL (ref 150–400)
RBC: 3.7 MIL/uL — ABNORMAL LOW (ref 3.87–5.11)
RDW: 15.2 % (ref 11.5–15.5)
WBC Count: 8.8 K/uL (ref 4.0–10.5)
nRBC: 0 % (ref 0.0–0.2)

## 2024-08-28 LAB — CMP (CANCER CENTER ONLY)
ALT: 19 U/L (ref 0–44)
AST: 22 U/L (ref 15–41)
Albumin: 4 g/dL (ref 3.5–5.0)
Alkaline Phosphatase: 74 U/L (ref 38–126)
Anion gap: 11 (ref 5–15)
BUN: 32 mg/dL — ABNORMAL HIGH (ref 8–23)
CO2: 25 mmol/L (ref 22–32)
Calcium: 9.5 mg/dL (ref 8.9–10.3)
Chloride: 101 mmol/L (ref 98–111)
Creatinine: 0.87 mg/dL (ref 0.44–1.00)
GFR, Estimated: >60 mL/min (ref >60)
Glucose, Bld: 134 mg/dL — ABNORMAL HIGH (ref 70–99)
Potassium: 3.6 mmol/L (ref 3.5–5.1)
Sodium: 137 mmol/L (ref 135–145)
Total Bilirubin: 0.2 mg/dL (ref 0.0–1.2)
Total Protein: 6.3 g/dL — ABNORMAL LOW (ref 6.5–8.1)

## 2024-08-28 MED ORDER — DEXAMETHASONE SOD PHOSPHATE PF 10 MG/ML IJ SOLN
10.0000 mg | Freq: Once | INTRAMUSCULAR | Status: AC
  Administered 2024-08-28: 12:00:00 10 mg via INTRAVENOUS

## 2024-08-28 MED ORDER — FAMOTIDINE IN NACL 20-0.9 MG/50ML-% IV SOLN
20.0000 mg | Freq: Once | INTRAVENOUS | Status: AC
  Administered 2024-08-28: 12:00:00 20 mg via INTRAVENOUS
  Filled 2024-08-28: qty 50

## 2024-08-28 MED ORDER — AZITHROMYCIN 250 MG PO TABS: ORAL_TABLET | ORAL | 0 refills | Status: AC

## 2024-08-28 MED ORDER — SODIUM CHLORIDE 0.9 % IV SOLN
INTRAVENOUS | Status: DC
  Filled 2024-08-28: qty 250

## 2024-08-28 MED ORDER — DIPHENHYDRAMINE HCL 50 MG/ML IJ SOLN
25.0000 mg | Freq: Once | INTRAMUSCULAR | Status: AC
  Administered 2024-08-28: 12:00:00 25 mg via INTRAVENOUS

## 2024-08-28 MED ORDER — SODIUM CHLORIDE 0.9 % IV SOLN
60.0000 mg/m2 | Freq: Once | INTRAVENOUS | Status: AC
  Administered 2024-08-28: 13:00:00 126 mg via INTRAVENOUS
  Filled 2024-08-28: qty 21

## 2024-08-28 MED ORDER — PREDNISONE 10 MG PO TABS: 10.0000 mg | ORAL_TABLET | Freq: Two times a day (BID) | ORAL | 0 refills | Status: AC

## 2024-08-28 MED ORDER — DIPHENHYDRAMINE HCL 50 MG/ML IJ SOLN
50.0000 mg | Freq: Once | INTRAMUSCULAR | Status: DC
  Filled 2024-08-28: qty 1
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
## 2024-08-28 NOTE — Patient Instructions (Signed)

## 2024-08-28 NOTE — Progress Notes (Signed)
 Patient is having some head congestion, drainage in throat, and respiratory issues. She would like something to help get over this issue.  She is also having some diarrhea and spots popping up all over her body (could be due to treatment).

## 2024-08-30 ENCOUNTER — Other Ambulatory Visit: Payer: Self-pay

## 2024-09-03 ENCOUNTER — Telehealth: Payer: Self-pay | Admitting: *Deleted

## 2024-09-03 DIAGNOSIS — Z170 Estrogen receptor positive status [ER+]: Secondary | ICD-10-CM

## 2024-09-03 DIAGNOSIS — G629 Polyneuropathy, unspecified: Secondary | ICD-10-CM

## 2024-09-03 DIAGNOSIS — J069 Acute upper respiratory infection, unspecified: Secondary | ICD-10-CM

## 2024-09-03 DIAGNOSIS — R051 Acute cough: Secondary | ICD-10-CM

## 2024-09-03 DIAGNOSIS — J988 Other specified respiratory disorders: Secondary | ICD-10-CM

## 2024-09-03 MED ORDER — AMOXICILLIN-POT CLAVULANATE 875-125 MG PO TABS: 1.0000 | ORAL_TABLET | Freq: Two times a day (BID) | ORAL | 0 refills | Status: AC

## 2024-09-03 MED ORDER — DULOXETINE HCL 30 MG PO CPEP: ORAL_CAPSULE | ORAL | 3 refills | Status: AC

## 2024-09-03 NOTE — Telephone Encounter (Signed)
 Neuropathy- ok to send prescrption for cymbalta  30 mg daily for 1 week and increase to 60 mg after 1 week  Cough- can she come for CXR today? Ok to send prescription for augmentin  for 7 days. She was using steroids and that did not help her cough so not giving her another course of steroids

## 2024-09-03 NOTE — Telephone Encounter (Signed)
 Patient called with multiple complaints. RN spoke with patient for almost 30 mins.  She reports a productive cough:  Sputum color and quantity:  thick streaks of mucous. Ongoing x 3 weeks. Current temp at home reported at 100.7, Route taken:  forehead, but she is not sure if this is accurate. She checked it again it was 98. She does not not feels febrile. Denies any chills or sore throat. She was dx with the common cold a few weeks ago.  She reports that her neuropathy is getting worse and would like to be giving an rx for cymbalta .  Patient stated that she called Dr. Melanee this weekend with c/o dizziness. She stated at the time of the call she was informed to stop her Flonase  and prednisone . She had elevated bp at that time at 181/81. Her dizziness is improving and her bp is now 136/72 on average.   She stated that yesterday she experienced several episodes of nausea vomiting I felt like I was vomiting up bile. She reports some episodes of diarrhea. She did not use any imodium for her diarrhea. I instructed patient that if diarrhea reoccur that she needs to use imodium OTC. She did use compazine  for nausea and was able to get relief. No further episodes of vomiting since taking the antiemetics. She reports poor appetite. She would like to manage these symptoms at home with increase po fluid intake.   Dr. Randell advise on neuropathy management with Cymbalta  and any interventions for her productive cough.        See RN assessment below     NURSING SYMPTOM TRIAGE ASSESSMENT & DISPOSITION  Mode of interaction:  Telephone Date of symptom triage interaction:  09/03/2024 Time of symptom triage interaction:  11:20 am- 30 mins phone call.  Cancer diagnosis:  Malignant neoplasm of upper-outer quadrant of right breast in female, estrogen receptor positive (HCC) Patient is on Treatment Plan :  BREAST DOSE DENSE AC q14d / PACLitaxel  q7d  Last treatment date:  08/28/2024-pt on weekly  adjuvant Taxol      Symptom Triage Protocol:  Nausea and Vomiting  History of the problem:  Nausea Severity: 0-10 scale with 0 as not severe and 10 as unimaginably severe 1 - 3 = Mild  Precipitating factors: chemotherapy  Onset: Acute onset last night.  Duration: Acute onset last night- resolving  Relieving factors: compazine   Nonpharmacologic interventions: She tried drinking Ginger ale, Effectiveness: but the taste of the ginger ale made her more nauseated.   Food and fluid intake over past 24 hours: Patient has had limited intake over the last 24 hours. Bites and bits of food  Associated symptoms: Vomiting, diarrhea  Nausea Grade: 1 = Loss of appetite without alteration in eating habits 2 = Oral intake decreased without significant weight loss, dehydration, or malnutrition 3 = Inadequate oral caloric or fluid intake; tube feeding, TPN, or hospitalization indicated 1     History of the problem:  Vomiting Character, color, force, quality of vomit:  Vomiting episodes yesterday, appeared like yellow bile  Other household members experiencing similar symptoms:  no  Are there other GI symptoms:  Diarrhea  Severity: 0-10 scale with 0 as not severe and 10 as unimaginably severe 1 - 3 = Mild  Precipitating factors:  Chemotherapy  Onset:  Acute onset last night.  Duration:    Relieving factors:  Patient took compazine  and was able to get relief yesterday. Last night. And symptoms resolved.  Nonpharmacologic interventions:  She tried drinking Ginger ale,  Effectiveness: but the taste of the ginger ale made her more nauseated.   Food and Fluid intake over past 24 hours:  Patient has had limited intake over the last 24 hours. Bites and bits of food  Associated symptoms:  Dry mucous membranes  Vomiting Grade:   1 = Intervention not indicated  2 = Outpatient IV hydration; medical intervention indicated  3 = Tube feeding, TPN, or hospitalization indicated  4 = Life-threatening  consequences  5 = Death  1     Triage Nurse Guidance:    Signs and Symptoms Action  Grossly bloody stool   Signs of severe dehydration  Severe lethargy or weakness  Heart palpitations  Decreased urine output  Sunken eyes  Orthostatic hypotension  Dizziness   Temperature higher than 100.4 F WITH suspect neutropenia   Symptoms of immune-mediated colitis, such as diarrhea accompanied by blood in the stool, severe abdominal pain, or tenderness   Symptoms of bowel perforation, such as diarrhea associated with severe abdominal pain, fever chills, nausea, and vomiting  Seek emergency care.  Call an ambulance immediately.    Excessive thirst, dry mouth  Temperature higher than 100.4 F WITHOUT suspect neutropenia  Diarrhea for more than five (5) days  More than four to six stools above baseline per day for two days  Swollen or painful abdomen  More than 10 stools per day  Weight loss of more than five (5) pounds since diarrhea began  Continued diarrhea despite antidiarrheal treatment  Decreased turgor; pinched skin does not spring back  Grade 3 or 4 nausea and vomiting   Seek urgent care within 24 hours.   Less than four (4) stools per day  Chronic diarrhea  Other family members with diarrhea  Recent travel to a foreign country  New prescription  Follow homecare instructions.  Notify MD if no improvement.      Patient Instruction  Disclaimer:  Patient specific and [employer] instruction provided verbally and sent through MyChart for active users.    Review with healthcare provider the prescribed antiemetic therapy, dose schedule, and route. Correctly and regularly comply with prescribed medication. Take frequent small sips of fluids.  Eat small amounts of food frequently.  Supplement diet with ginger or foods containing ginger.  Take an antiemetic 20 minutes prior to meals.  Monitor for signs of dehydration. Use distraction therapies (example:  music, breathing techniques,  etc.)  Contact a healthcare provider during clinic hours if symptoms persist or become worse. If the patient is compliant with the antiemetic medication, contact the healthcare provider to receive an alternative antiemetic prescription  Seek Emergency Care Immediately if Any of the Following Occurs: Evidence of dehydration  Unable to eat or drink for 24 hours  Treatment change not effective within six hours  Teach Back Method used:  Yes      History of the problem:  Diarrhea Frequency of stools:  Every time she eats something  Consistency:  Liquid  Color:  brown  Odor:  No odor  Undigested food or fat:  No  Mucous present:  No  Blood present:  No  Number of stools in past 24 hours:  Twice yesterday and 1 loose stool this morning.  Weight loss:  Yes:  3.5 pounds per patient  Urine output:  Normal voiding  Urine Character:  clear  Signs of dehydration:  Dry mouth and Weakness  Precipitating factors:  On chemotherapy  Onset:  Yesterday 09/02/2024  Duration:  acute  Relieving factors:  Has NOT  TAKEN any Imodium.   Patient encouraged/provided instructions to take Imodium AD-2 caplets after the first loose stool; 1 caplet after each subsequent loose stool; but no more than 4 caplets in 24 hours  Associated symptoms:  Weight loss, Nausea, and Vomiting  Changes in ADLs:  No     Diet History  Food intolerance:  none  Food aversions:  none  Food allergies:  none  Consumption of well water :  No  Ingestion of unpasteurized milk or milk products:  No  Consumption of raw seafood:  No  Recent travel abroad:  No  Exposure to farm animals or animal feces  No     Triage Nurse Guidance Signs and Symptoms Action  Grossly bloody stool   Signs of severe dehydration  Severe lethargy or weakness  Heart palpitations  Decreased urine output  Sunken eyes  Orthostatic hypotension  Dizziness   Temperature higher than 100.4 F WITH suspect neutropenia   Symptoms of immune-mediated  colitis, such as diarrhea accompanied by blood in the stool, severe abdominal pain, or tenderness   Symptoms of bowel perforation, such as diarrhea associated with severe abdominal pain, fever chills, nausea, and vomiting  Seek emergency care.  Call an ambulance immediately.    Excessive thirst, dry mouth  Temperature higher than 100.4 F WITHOUT suspect neutropenia  Diarrhea for more than five (5) days  More than four to six stools above baseline per day for two days  Swollen or painful abdomen  More than 10 stools per day  Weight loss of more than five (5) pounds since diarrhea began  Continued diarrhea despite antidiarrheal treatment  Decreased turgor; pinched skin does not spring back  Grade 3 or 4 nausea and vomiting   Seek urgent care within 24 hours.   Less than four (4) stools per day  Chronic diarrhea  Other family members with diarrhea  Recent travel to a foreign country  New prescription  Follow homecare instructions.  Notify MD if no improvement.       Patient Instruction  Disclaimer:  Patient specific and [employer] instruction provided verbally and sent through MyChart for active users.    Consider food and fluid modifications: Drink 8-10 eight-ounce glasses of fluids per day (e.g., water , diluted cranberry juice, sports drinks, decaffeinated tea or coffee). Eat foods high in soluble fiber (e.g., bananas, oatmeal, applesauce, skinned turkey, chicken, rice, toast). Consider foods containing pectin, a natural fiber that decreases diarrhea. These foods include beets, peeled apples, white rice, bananas, baked potatoes without skin, white bread, plain pasta, avocadoes, and asparagus tips. Eat easy-to-digest foods high in protein, calories, and potassium. - Cook all vegetables well. Raw vegetables are difficult to digest. - Eat small, frequent meals. Do not eat large meals. - Eat foods at room temperature, as hot and cold temperature foods may insti-gate diarrhea. Avoid foods  high in insoluble fiber (e.g., raw fruits and vegetables, skins, seeds, legumes). Avoid milk and dairy products, caffeine, alcohol, sucrose, and sorbitol. - Avoid greasy, fatty, spicy, or fried foods and foods containing olestra. - Refrain from taking fiber supplements.  Implement a rectal skin care routine: Clean the perineal area well with mild soap and water  or aloe-based baby wipes. Apply barrier ointment (e.g., zinc oxide) for protection. Sitz baths may add comfort.  Examine the rectal area for red, scaly, or broken skin. If this is present, report it to the healthcare provider. Record the frequency, quality, and volume of stools during the course of treatment.  If diarrhea lasts more  than 24 hours, notify the healthcare provider.  Also, consult a healthcare provider before taking any over-the-counter antidiarrheal medications. These can be very effective but may not be appropriate for this situation.  If treatment with immunotherapy or targeted therapies has been prescribed, consultation with the healthcare team should begin prior to starting antidiarrheal.  If prescribed, keep track of medications administered--type, amount, and frequency.  Report the Following Problems: Inability to keep fluids down for 24 hours Diarrhea lasting more than 24 hours Dark yellow urine or absence of urine production More than four (4) to six (6) bowel movements above baseline per day for two days in a row Grade 2 or higher nausea and vomiting that accompanies diarrhea Dizziness Rectal bleeding Temperature greater than 100.4 F Swollen or painful abdomen Red, scaly, or broken skin of the rectal area  Teach Back Method used:  Yes       History of the problem:  Flu-Like Symptoms--cold/cough   Current symptoms: Cough, productive:  Sputum color and quantity:  thick streaks of mucous, Poor appetite, and Nausea  Onset: Ongoing for 3 weeks  Duration:   Current temperature: 100.7, Route taken:   forehead, but she is not sure if this is accurate. She checked it again it was 98. She does not not feels febrile.   Relieving factors: None-has not tried any otc interventions  Provoking factors: none  Changes in ADLs: none     Additional commentary: Last took Tylenol  2 tablets about an hour ago.     Triage Nurse Guidance Severity Index  Temperature higher than 102F (38.9C) without suspected neutropenia Severe flu-like symptom cluster, limiting ability to carry out activities of daily living Change in mental status Seek emergency care.  Symptoms not relieved by current homecare instructions within 24 hours Seek urgent care within 24 hours.  If flu-like syndrome is expected from the current therapy Follow homecare instructions. Notify MD if no improvement.      Patient Instruction  Disclaimer:  Patient specific and [employer] instruction provided verbally and sent through MyChart for active users.    Fever (non-neutropenic) Use nonsteroidal anti-inflammatory drugs such as acetaminophen  or ibupro-fen unless contraindicated. Aspirin can also be used by adults if platelet count is above 50,000/mm3 Try tepid baths or ice packs as tolerated. Maintain hydration.  Chills For severe chills, a prescription for a narcotic such as meperidine or hydro-morphone may be necessary. Use comfort measures such as warm clothing, blankets, or a hot water  bottle (use caution in areas treated with radiation therapy and risk of burns).  Myalgia and headache Use nonsteroidal anti-inflammatory drugs such as acetaminophen  or ibuprofen  unless contraindicated. Opioids and prednisone  may be prescribed. Apply heat or cold on muscles. Apply a cool cloth on forehead. Rest in a quiet, dimly lit room. Apply heat and massage for headaches at the back of the head.  Malaise/fatigue Balance periods of activity with rest periods. Eat a balanced diet.  Cough Use an antitussive  such as codeine,  dextromethorphan, or benzonatate . Use a decongestant if needed. Use an antihistamine if experiencing a runny nose.  Use a humidifier if experiencing a dry cough.  Seek Emergency Care Immediately if Any of the Following Occurs: Elevated temperature for more than three days Vomiting Seizures Changes in vision Change in mental status  Teach Back Method used:  Yes          Provider Consulted  Provider name and credentials: Msg sent to Dr. Melanee for recommendations  Provider instruction: Neuropathy- ok to send prescrption for  cymbalta  30 mg daily for 1 week and increase to 60 mg after 1 week   Cough- can she come for CXR today? Ok to send prescription for augmentin  for 7 days. She was using steroids and that did not help her cough so not giving her another course of steroids           Nurse Triage Priority:  Non-urgent (review and reinforce self-care strategies)  Barriers to Care:  None identified  Nurse Triage Disposition:  Home care, return call if no improvement within 24 hours   Addendum: Discussed care plan with patient in detail. Patient gave verbal understanding of the plan of care. However, she is hesitant to proceed with chest xray at this time. Ordered entered Patient educated on Cymbalta  side effects and pamphlet on Cymbalta  will be sent to the patient via mychart as well as written triage instructions.  Patient understands to call back if symptoms to do improve.    Protocol Source:  Ruthine HERO., & Eldonna CANDIE Pee.). (2019). Telephone triage for oncology nurses (3rd ed.). [employer].

## 2024-09-09 ENCOUNTER — Encounter: Payer: Self-pay | Admitting: *Deleted

## 2024-09-09 ENCOUNTER — Other Ambulatory Visit: Payer: Self-pay | Admitting: Oncology

## 2024-09-09 DIAGNOSIS — Z170 Estrogen receptor positive status [ER+]: Secondary | ICD-10-CM

## 2024-09-09 NOTE — Progress Notes (Signed)
 Ms. Russell will see Dr. Melanee and Dr. Lenn on 1/7.  Appt. Details given to her.

## 2024-09-11 ENCOUNTER — Inpatient Hospital Stay

## 2024-09-18 ENCOUNTER — Inpatient Hospital Stay

## 2024-09-18 ENCOUNTER — Ambulatory Visit: Payer: Self-pay | Admitting: Oncology

## 2024-09-18 ENCOUNTER — Encounter: Payer: Self-pay | Admitting: Oncology

## 2024-09-18 ENCOUNTER — Other Ambulatory Visit: Payer: Self-pay

## 2024-09-18 ENCOUNTER — Inpatient Hospital Stay: Admitting: Oncology

## 2024-09-18 ENCOUNTER — Ambulatory Visit
Admission: RE | Admit: 2024-09-18 | Discharge: 2024-09-18 | Disposition: A | Discharge status: Home / Self Care | Source: Ambulatory Visit | Attending: Radiation Oncology | Admitting: Radiation Oncology

## 2024-09-18 ENCOUNTER — Inpatient Hospital Stay: Attending: Oncology | Admitting: Oncology

## 2024-09-18 VITALS — BP 117/53 | HR 76 | Temp 97.8°F | Resp 20 | Ht 66.0 in | Wt 205.4 lb

## 2024-09-18 DIAGNOSIS — G620 Drug-induced polyneuropathy: Secondary | ICD-10-CM

## 2024-09-18 DIAGNOSIS — Z170 Estrogen receptor positive status [ER+]: Secondary | ICD-10-CM

## 2024-09-18 DIAGNOSIS — T451X5A Adverse effect of antineoplastic and immunosuppressive drugs, initial encounter: Secondary | ICD-10-CM | POA: Diagnosis not present

## 2024-09-18 DIAGNOSIS — C50411 Malignant neoplasm of upper-outer quadrant of right female breast: Secondary | ICD-10-CM | POA: Insufficient documentation

## 2024-09-18 DIAGNOSIS — R350 Frequency of micturition: Secondary | ICD-10-CM

## 2024-09-18 DIAGNOSIS — E785 Hyperlipidemia, unspecified: Secondary | ICD-10-CM | POA: Insufficient documentation

## 2024-09-18 DIAGNOSIS — E039 Hypothyroidism, unspecified: Secondary | ICD-10-CM | POA: Insufficient documentation

## 2024-09-18 DIAGNOSIS — Z79811 Long term (current) use of aromatase inhibitors: Secondary | ICD-10-CM | POA: Insufficient documentation

## 2024-09-18 DIAGNOSIS — Z87442 Personal history of urinary calculi: Secondary | ICD-10-CM | POA: Insufficient documentation

## 2024-09-18 DIAGNOSIS — Z79899 Other long term (current) drug therapy: Secondary | ICD-10-CM | POA: Insufficient documentation

## 2024-09-18 DIAGNOSIS — M129 Arthropathy, unspecified: Secondary | ICD-10-CM | POA: Insufficient documentation

## 2024-09-18 DIAGNOSIS — Z809 Family history of malignant neoplasm, unspecified: Secondary | ICD-10-CM | POA: Insufficient documentation

## 2024-09-18 DIAGNOSIS — R21 Rash and other nonspecific skin eruption: Secondary | ICD-10-CM | POA: Insufficient documentation

## 2024-09-18 DIAGNOSIS — Z87891 Personal history of nicotine dependence: Secondary | ICD-10-CM | POA: Insufficient documentation

## 2024-09-18 LAB — CBC WITH DIFFERENTIAL/PLATELET
Abs Immature Granulocytes: 0.03 K/uL (ref 0.00–0.07)
Basophils Absolute: 0 K/uL (ref 0.0–0.1)
Basophils Relative: 0 %
Eosinophils Absolute: 0.1 K/uL (ref 0.0–0.5)
Eosinophils Relative: 2 %
HCT: 36.7 % (ref 36.0–46.0)
Hemoglobin: 12.4 g/dL (ref 12.0–15.0)
Immature Granulocytes: 0 %
Lymphocytes Relative: 11 %
Lymphs Abs: 0.9 K/uL (ref 0.7–4.0)
MCH: 31.9 pg (ref 26.0–34.0)
MCHC: 33.8 g/dL (ref 30.0–36.0)
MCV: 94.3 fL (ref 80.0–100.0)
Monocytes Absolute: 0.7 K/uL (ref 0.1–1.0)
Monocytes Relative: 9 %
Neutro Abs: 6.2 K/uL (ref 1.7–7.7)
Neutrophils Relative %: 78 %
Platelets: 177 K/uL (ref 150–400)
RBC: 3.89 MIL/uL (ref 3.87–5.11)
RDW: 13.5 % (ref 11.5–15.5)
WBC: 8 K/uL (ref 4.0–10.5)
nRBC: 0 % (ref 0.0–0.2)

## 2024-09-18 LAB — COMPREHENSIVE METABOLIC PANEL WITH GFR
ALT: 17 U/L (ref 0–44)
AST: 21 U/L (ref 15–41)
Albumin: 3.9 g/dL (ref 3.5–5.0)
Alkaline Phosphatase: 93 U/L (ref 38–126)
Anion gap: 11 (ref 5–15)
BUN: 21 mg/dL (ref 8–23)
CO2: 26 mmol/L (ref 22–32)
Calcium: 9.7 mg/dL (ref 8.9–10.3)
Chloride: 100 mmol/L (ref 98–111)
Creatinine, Ser: 0.86 mg/dL (ref 0.44–1.00)
GFR, Estimated: >60 mL/min
Glucose, Bld: 110 mg/dL — ABNORMAL HIGH (ref 70–99)
Potassium: 3.6 mmol/L (ref 3.5–5.1)
Sodium: 137 mmol/L (ref 135–145)
Total Bilirubin: 0.2 mg/dL (ref 0.0–1.2)
Total Protein: 6.6 g/dL (ref 6.5–8.1)

## 2024-09-18 LAB — URINALYSIS, COMPLETE (UACMP) WITH MICROSCOPIC
Bacteria, UA: NONE SEEN
Bilirubin Urine: NEGATIVE
Glucose, UA: NEGATIVE mg/dL
Ketones, ur: NEGATIVE mg/dL
Nitrite: NEGATIVE
Protein, ur: 30 mg/dL — AB
Specific Gravity, Urine: 1.024 (ref 1.005–1.030)
WBC, UA: >50 WBC/hpf (ref 0–5)
pH: 5 (ref 5.0–8.0)

## 2024-09-18 MED ORDER — AMOXICILLIN-POT CLAVULANATE 875-125 MG PO TABS: 1.0000 | ORAL_TABLET | Freq: Two times a day (BID) | ORAL | 0 refills | Status: AC

## 2024-09-18 NOTE — Consult Note (Signed)
 " NEW PATIENT EVALUATION  Name: Rebecca Patton  MRN: [id number]  Date:   09/18/2024     DOB: [birth year]   This 79 y.o. 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  REFERRING PHYSICIAN: Bertrum Charlie CROME, MD  CHIEF COMPLAINT:  Chief Complaint  Patient presents with   Breast Cancer    DIAGNOSIS: The encounter diagnosis was Malignant neoplasm of upper-outer quadrant of right female breast, unspecified estrogen receptor status (HCC).   PREVIOUS INVESTIGATIONS:  Mammograms ultrasound MRI scans and PET scans all reviewed Pathology reports reviewed Clinical notes reviewed  HPI: Patient is a 79 year old female former patient of mine close to 20 years prior for treatment of cervical cancer who presents with an abnormal mammogram of her right breast.  At that time there are multifocal architectural distortions with corresponding irregular hypoechoic masses in the right breast.  No evidence of right axillary adenopathy was identified.  She underwent 3 biopsies of her right breast showing at the 10 o'clock position invasive mammary carcinoma which was 8 cm from the nipple.  Also at 10 o'clock position 7 cm from the nipple invasive   Tumor was strongly ER/PR positive mammary carcinoma as well as 11 o'clock position 5 cm from the nipple again invasive mammary carcinoma.  She underwent a right lumpectomy for a 5 x 4.6 x 2.5 cm invasive lobular carcinoma strongly ER/PR positive HER2/neu not overexpressed.  5 regional lymph nodes were sampled for lymph nodes had macro metastatic disease size of largest metastatic deposit was 1.9 cm extranodal extension was not identified.  She had a repeat axillary lymph node dissection showing metastatic lobular carcinoma involving 7 of 21 lymph nodes with extracapsular extension.  Also breast excision of the anterior and  superior margins were negative for malignancy.  Oncotype DX was low recurrence risk score at 9 although on second opinion at [facility] she was recommended for adjuvant chemotherapy.  Patient has undergone AC and Taxol  chemotherapy with chemotherapy induced neuropathy.  She completed her AC although had 5 cycles of Taxol  which is now being discontinued.  She has developed a rash which seems to be resolving.  She is seen today for radiation oncology opinion.  She specifically denies breast tenderness cough or bone pain.  She is having no significant swelling in her right upper extremity.  She is seeing [facility] for physical therapy.  Her postop PET scan showed postsurgical changes showing some radiotracer uptake associated with fluid collection which is nonspecific.  No sign of metastatic disease was noted.  PLANNED TREATMENT REGIMEN: Right whole breast and peripheral lymphatic radiation  PAST MEDICAL HISTORY:  has a past medical history of Anxiety, Arthritis, COVID-19 (09/2020), Depression, GERD (gastroesophageal reflux disease), High blood pressure, History of kidney stones, Hyperlipidemia, Hypothyroidism, Malignant neoplasm of upper-outer quadrant of right breast, estrogen receptor positive (HCC), MRSA infection (2006), Pneumonia (10/13/2023), Pre-diabetes, Supraumbilical hernia, Symptomatic cholelithiasis, and Uterine cancer (HCC) (2009).    PAST SURGICAL HISTORY:  Past Surgical History:  Procedure Laterality Date   ABDOMINAL HYSTERECTOMY  09/13/2007   BSO   AXILLARY LYMPH NODE DISSECTION Right 03/13/2024   Procedure: REDGIE HARD;  Surgeon: Rodolph Romano, MD;  Location: [facility];  Service: General;  Laterality: Right;   AXILLARY SENTINEL NODE BIOPSY Right 02/19/2024   Procedure: BIOPSY, LYMPH NODE, SENTINEL, AXILLARY;  Surgeon: Rodolph Romano, MD;  Location: [facility];  Service: General;  Laterality:  Right;   BREAST BIOPSY Right 01/24/2024   US  RT BREAST BX W LOC DEV EA ADD LESION  IMG BX SPEC US  GUIDE 01/24/2024 [facility]   BREAST BIOPSY Right 01/24/2024   US  RT BREAST BX W LOC DEV 1ST LESION IMG BX SPEC US  GUIDE 01/24/2024 [facility]   BREAST BIOPSY Right 01/24/2024   US  RT BREAST BX W LOC DEV EA ADD LESION IMG BX SPEC US  GUIDE 01/24/2024 [facility]   BREAST BIOPSY Right 02/19/2024   MM RT PLC BREAST LOC DEV   1ST LESION  INC MAMMO GUIDE 02/19/2024 [facility]   BREAST LUMPECTOMY Right 02/19/2024   Procedure: BREAST LUMPECTOMY;  Surgeon: Rodolph Romano, MD;  Location: [facility];  Service: General;  Laterality: Right;  with needle localization   BREAST LUMPECTOMY Right 03/13/2024   Procedure: BREAST LUMPECTOMY;  Surgeon: Rodolph Romano, MD;  Location: [facility];  Service: General;  Laterality: Right;   BUNIONECTOMY     CHOLECYSTECTOMY     COLONOSCOPY WITH PROPOFOL  N/A 01/23/2020   Procedure: COLONOSCOPY WITH PROPOFOL ;  Surgeon: Toledo, Ladell POUR, MD;  Location: [facility];  Service: Gastroenterology;  Laterality: N/A;   EXCISION NEUROMA     INSERTION OF MESH N/A 05/18/2018   Procedure: INSERTION OF MESH;  Surgeon: Nicholaus Selinda Birmingham, MD;  Location: [facility];  Service: General;  Laterality: N/A;   NASAL SINUS SURGERY     PORTACATH PLACEMENT Left 05/14/2024   Procedure: INSERTION, TUNNELED CENTRAL VENOUS DEVICE, WITH PORT;  Surgeon: Rodolph Romano, MD;  Location: [facility];  Service: General;  Laterality: Left;   TUBAL LIGATION     UMBILICAL HERNIA REPAIR N/A 05/18/2018   Procedure: HERNIA REPAIR SUPRA-UMBILICAL ADULT;  Surgeon: Nicholaus Selinda Birmingham, MD;  Location: [facility];  Service: General;  Laterality: N/A;    FAMILY HISTORY: family history includes CAD in her father; Cancer in her brother and sister; Diabetes in her father; Pancreatic cancer in her brother; Parkinson's disease in her mother; Tremor in her brother.  SOCIAL HISTORY:  reports that she quit smoking about 27 years ago. Her smoking use included cigarettes. She started  smoking about 52 years ago. She has a 12.5 pack-year smoking history. She has never used smokeless tobacco. She reports that she does not drink alcohol and does not use drugs.  ALLERGIES: Patient has no known allergies.  MEDICATIONS:  Current Outpatient Medications  Medication Sig Dispense Refill   acetaminophen  (TYLENOL ) 500 MG tablet Take 500 mg by mouth every 6 (six) hours as needed for moderate pain (pain score 4-6).     acidophilus (RISAQUAD) CAPS capsule Take 1 capsule by mouth daily.     ASHWAGANDHA PO Take 1 tablet by mouth at bedtime. (Patient not taking: Reported on 09/18/2024)     aspirin 81 MG tablet Take 81 mg by mouth See admin instructions. Take 81 mg by mouth every 4 days     celecoxib (CELEBREX) 100 MG capsule Take 100 mg by mouth in the morning.     chlorpheniramine-HYDROcodone  (TUSSIONEX) 10-8 MG/5ML Take 5 mLs by mouth at bedtime as needed for cough. (Patient not taking: Reported on 09/03/2024) 140 mL 0   Collagen-Vitamin C-Biotin (COLLAGEN PO) Take 1 Scoop by mouth daily. (Patient not taking: Reported on 09/03/2024)     DULoxetine  (CYMBALTA ) 30 MG capsule cymbalta  30 mg daily for 1 week and increase to 60 mg after 1 week if tolerated 60 capsule 3   fluticasone  (FLONASE ) 50 MCG/ACT nasal spray USE 1 SPRAY IN EACH NOSTRILDAILY 48 g 2  Garlic 1000 MG CAPS Take 1,000 mg by mouth daily. (Patient not taking: Reported on 09/03/2024)     hydrochlorothiazide  (HYDRODIURIL ) 25 MG tablet TAKE 1 TABLET DAILY. 90 tablet 3   hydrocortisone cream 1 % Apply 1 Application topically daily as needed for itching.     letrozole  (FEMARA ) 2.5 MG tablet Take 1 tablet (2.5 mg total) by mouth daily. (Patient not taking: Reported on 09/03/2024) 30 tablet 3   levothyroxine  (SYNTHROID ) 75 MCG tablet TAKE 1 TABLET DAILY BEFORE BREAKFAST 90 tablet 1   losartan  (COZAAR ) 100 MG tablet TAKE 1 TABLET(100 MG) BY MOUTH DAILY 90 tablet 1   MAGNESIUM GLYCINATE PO Take 300 mg by mouth at bedtime.     melatonin 3  MG TABS tablet Take 3 mg by mouth at bedtime as needed (sleep). (Patient not taking: Reported on 09/18/2024)     metoprolol  succinate (TOPROL -XL) 25 MG 24 hr tablet TAKE 1 TABLET DAILY. TAKE  WITH OR IMMEDIATELY        FOLLOWING A MEAL. 30 tablet 0   MULTIPLE VITAMIN PO Take 1 tablet by mouth daily. Centrum Silver (Patient not taking: Reported on 09/03/2024)     Nutritional Supplements (FRUIT & VEGETABLE DAILY) CAPS Take 2 capsules by mouth at bedtime. (1) Fruit + (1) Vegetable (Patient not taking: Reported on 09/03/2024)     nystatin  (MYCOSTATIN /NYSTOP ) powder Apply 1 Application topically 3 (three) times daily. 15 g 0   omeprazole  (PRILOSEC) 20 MG capsule TAKE 1 CAPSULE TWICE DAILY BEFORE MEALS 180 capsule 1   oxyCODONE  (OXY IR/ROXICODONE ) 5 MG immediate release tablet Take 1 tablet (5 mg total) by mouth every 6 (six) hours as needed for severe pain (pain score 7-10). 30 tablet 0   oxymetazoline (AFRIN) 0.05 % nasal spray Place 1 spray into both nostrils 2 (two) times daily as needed for congestion.     predniSONE  (DELTASONE ) 10 MG tablet Take 1 tablet (10 mg total) by mouth 2 (two) times daily with a meal. 10 tablet 0   Red Yeast Rice Extract (RED YEAST RICE PO) Take 1 capsule by mouth daily. (Patient not taking: Reported on 09/03/2024)     traZODone  (DESYREL ) 50 MG tablet TAKE 1 TABLET BY MOUTH EVERYDAY AT BEDTIME (Patient not taking: Reported on 09/03/2024) 90 tablet 1   TURMERIC PO Take 1,000 mg by mouth at bedtime. (Patient not taking: Reported on 09/03/2024)     vitamin B-12 (CYANOCOBALAMIN ) 100 MCG tablet Take 200 mcg by mouth daily.     No current facility-administered medications for this encounter.    ECOG PERFORMANCE STATUS:  0 - Asymptomatic  REVIEW OF SYSTEMS: Prior history of cervical cancer Patient denies any weight loss, fatigue, weakness, fever, chills or night sweats. Patient denies any loss of vision, blurred vision. Patient denies any ringing  of the ears or hearing loss. No  irregular heartbeat. Patient denies heart murmur or history of fainting. Patient denies any chest pain or pain radiating to her upper extremities. Patient denies any shortness of breath, difficulty breathing at night, cough or hemoptysis. Patient denies any swelling in the lower legs. Patient denies any nausea vomiting, vomiting of blood, or coffee ground material in the vomitus. Patient denies any stomach pain. Patient states has had normal bowel movements no significant constipation or diarrhea. Patient denies any dysuria, hematuria or significant nocturia. Patient denies any problems walking, swelling in the joints or loss of balance. Patient denies any skin changes, loss of hair or loss of weight. Patient denies any excessive  worrying or anxiety or significant depression. Patient denies any problems with insomnia. Patient denies excessive thirst, polyuria, polydipsia. Patient denies any swollen glands, patient denies easy bruising or easy bleeding. Patient denies any recent infections, allergies or URI. Patient s visual fields have not changed significantly in recent time.   PHYSICAL EXAM: There were no vitals taken for this visit. Right breast there are several incisions which are well-healed no dominant masses noted in either breast no axillary or supraclavicular adenopathy is appreciated.  Well-developed well-nourished patient in NAD. HEENT reveals PERLA, EOMI, discs not visualized.  Oral cavity is clear. No oral mucosal lesions are identified. Neck is clear without evidence of cervical or supraclavicular adenopathy. Lungs are clear to A&P. Cardiac examination is essentially unremarkable with regular rate and rhythm without murmur rub or thrill. Abdomen is benign with no organomegaly or masses noted. Motor sensory and DTR levels are equal and symmetric in the upper and lower extremities. Cranial nerves II through XII are grossly intact. Proprioception is intact. No peripheral adenopathy or edema is  identified. No motor or sensory levels are noted. Crude visual fields are within normal range.  LABORATORY DATA: Pathology reports reviewed    RADIOLOGY RESULTS: Mammograms ultrasounds MRI scans and PET scans all reviewed compatible with above-stated findings   IMPRESSION: Stage IIIa ER positive invasive lobular carcinoma of the right breast status post wide local excision axillary lymph node dissection as well as adjuvant chemotherapy in 79 year old female  PLAN: At this that time based on the poor prognostic factors including the large size of her primary tumor multiple lymph nodes involvement with extranodal extension would offer right whole breast radiation and peripheral lymphatic radiation.  Will treat to 50 Gray in 25 fractions to both areas boosting her scar another 1000 cGy using photon beam therapy.  Risks and benefits of treatment including skin reaction fatigue alteration blood counts possible occlusion of superficial lung possibility of lymphedema right upper extremity based on the large amount of lymph nodes removed and postoperative radiation all were discussed with the patient in detail.  She comprehends my treatment plan well.  Patient also will be candidate for endocrine therapy after completion of radiation.  I have personally set up and ordered CT simulation for next week.  I would like to take this opportunity to thank you for allowing me to participate in the care of your patient.SABRA Marcey Penton, MD         "

## 2024-09-18 NOTE — Telephone Encounter (Signed)
-----   Message from Annah Skene, MD sent at 09/18/2024 11:37 AM EST ----- Urinalysis is positive for UTI.  Please send her a prescription of Augmentin  875 mg twice daily for 5 days

## 2024-09-18 NOTE — Telephone Encounter (Signed)
 Per Dr. Melanee Urinalysis is positive for UTI. Please send her a prescription of Augmentin  875 mg twice daily for 5 days.  Outbound call to patient, informed of above. Patient requested script to be sent to [facility] in [place].

## 2024-09-18 NOTE — Progress Notes (Signed)
 Patient doing okay, though she thinks she may be getting a UTI.
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
## 2024-09-20 LAB — URINE CULTURE: Culture: >=100000 — AB

## 2024-09-24 ENCOUNTER — Ambulatory Visit
Admission: RE | Admit: 2024-09-24 | Discharge: 2024-09-24 | Disposition: A | Discharge status: Home / Self Care | Source: Ambulatory Visit | Attending: Radiation Oncology | Admitting: Radiation Oncology

## 2024-09-24 DIAGNOSIS — C50411 Malignant neoplasm of upper-outer quadrant of right female breast: Secondary | ICD-10-CM | POA: Diagnosis present

## 2024-09-24 DIAGNOSIS — Z510 Encounter for antineoplastic radiation therapy: Secondary | ICD-10-CM | POA: Diagnosis present

## 2024-09-25 ENCOUNTER — Inpatient Hospital Stay

## 2024-09-25 ENCOUNTER — Other Ambulatory Visit: Payer: Self-pay | Admitting: *Deleted

## 2024-09-25 ENCOUNTER — Inpatient Hospital Stay: Admitting: Oncology

## 2024-09-25 DIAGNOSIS — C50411 Malignant neoplasm of upper-outer quadrant of right female breast: Secondary | ICD-10-CM

## 2024-09-27 DIAGNOSIS — Z510 Encounter for antineoplastic radiation therapy: Secondary | ICD-10-CM | POA: Diagnosis not present

## 2024-10-01 ENCOUNTER — Ambulatory Visit

## 2024-10-01 DIAGNOSIS — Z510 Encounter for antineoplastic radiation therapy: Secondary | ICD-10-CM | POA: Diagnosis not present

## 2024-10-02 ENCOUNTER — Other Ambulatory Visit: Payer: Self-pay

## 2024-10-02 ENCOUNTER — Ambulatory Visit
Admission: RE | Admit: 2024-10-02 | Discharge: 2024-10-02 | Disposition: A | Source: Ambulatory Visit | Attending: Radiation Oncology | Admitting: Radiation Oncology

## 2024-10-02 ENCOUNTER — Inpatient Hospital Stay

## 2024-10-02 DIAGNOSIS — Z510 Encounter for antineoplastic radiation therapy: Secondary | ICD-10-CM | POA: Diagnosis not present

## 2024-10-02 LAB — RAD ONC ARIA SESSION SUMMARY
Course Elapsed Days: 0
Plan Fractions Treated to Date: 1
Plan Prescribed Dose Per Fraction: 2 Gy
Plan Total Fractions Prescribed: 25
Plan Total Prescribed Dose: 50 Gy
Reference Point Dosage Given to Date: 2 Gy
Reference Point Session Dosage Given: 2 Gy
Session Number: 1

## 2024-10-03 ENCOUNTER — Other Ambulatory Visit: Payer: Self-pay

## 2024-10-03 ENCOUNTER — Ambulatory Visit
Admission: RE | Admit: 2024-10-03 | Discharge: 2024-10-03 | Disposition: A | Discharge status: Home / Self Care | Source: Ambulatory Visit | Attending: Radiation Oncology | Admitting: Radiation Oncology

## 2024-10-03 DIAGNOSIS — Z510 Encounter for antineoplastic radiation therapy: Secondary | ICD-10-CM | POA: Diagnosis not present

## 2024-10-03 LAB — RAD ONC ARIA SESSION SUMMARY
Course Elapsed Days: 1
Plan Fractions Treated to Date: 2
Plan Prescribed Dose Per Fraction: 2 Gy
Plan Total Fractions Prescribed: 25
Plan Total Prescribed Dose: 50 Gy
Reference Point Dosage Given to Date: 4 Gy
Reference Point Session Dosage Given: 2 Gy
Session Number: 2

## 2024-10-04 ENCOUNTER — Other Ambulatory Visit: Payer: Self-pay

## 2024-10-04 ENCOUNTER — Ambulatory Visit
Admission: RE | Admit: 2024-10-04 | Discharge: 2024-10-04 | Disposition: A | Source: Ambulatory Visit | Attending: Radiation Oncology | Admitting: Radiation Oncology

## 2024-10-04 DIAGNOSIS — Z510 Encounter for antineoplastic radiation therapy: Secondary | ICD-10-CM | POA: Diagnosis not present

## 2024-10-04 LAB — RAD ONC ARIA SESSION SUMMARY
Course Elapsed Days: 2
Plan Fractions Treated to Date: 3
Plan Prescribed Dose Per Fraction: 2 Gy
Plan Total Fractions Prescribed: 25
Plan Total Prescribed Dose: 50 Gy
Reference Point Dosage Given to Date: 6 Gy
Reference Point Session Dosage Given: 2 Gy
Session Number: 3

## 2024-10-07 ENCOUNTER — Other Ambulatory Visit: Payer: Self-pay | Admitting: Oncology

## 2024-10-07 ENCOUNTER — Ambulatory Visit

## 2024-10-08 ENCOUNTER — Ambulatory Visit

## 2024-10-08 ENCOUNTER — Encounter: Payer: Self-pay | Admitting: Oncology

## 2024-10-08 NOTE — Telephone Encounter (Signed)
 Does she want it again? Why am I refilling this?

## 2024-10-09 ENCOUNTER — Other Ambulatory Visit: Payer: Self-pay

## 2024-10-09 ENCOUNTER — Ambulatory Visit
Admission: RE | Admit: 2024-10-09 | Discharge: 2024-10-09 | Disposition: A | Source: Ambulatory Visit | Attending: Radiation Oncology | Admitting: Radiation Oncology

## 2024-10-09 DIAGNOSIS — Z510 Encounter for antineoplastic radiation therapy: Secondary | ICD-10-CM | POA: Diagnosis not present

## 2024-10-09 LAB — RAD ONC ARIA SESSION SUMMARY
Course Elapsed Days: 7
Plan Fractions Treated to Date: 4
Plan Prescribed Dose Per Fraction: 2 Gy
Plan Total Fractions Prescribed: 25
Plan Total Prescribed Dose: 50 Gy
Reference Point Dosage Given to Date: 8 Gy
Reference Point Session Dosage Given: 2 Gy
Session Number: 4

## 2024-10-09 NOTE — Telephone Encounter (Signed)
 Outbound call to patient to see if in need of refill for nystatin .  Per telephone note dated 05/30/24 I called the patient and let her know that Dr. Melanee was good to get nystatin  for under the belly part of the and the patient says she will go and pick it up sometime today.  Patient stated she is in need of cream and it is almost out; requested it be sent to [facility] in [place].

## 2024-10-10 ENCOUNTER — Other Ambulatory Visit: Payer: Self-pay

## 2024-10-10 ENCOUNTER — Ambulatory Visit
Admission: RE | Admit: 2024-10-10 | Discharge: 2024-10-10 | Disposition: A | Discharge status: Home / Self Care | Source: Ambulatory Visit | Attending: Radiation Oncology | Admitting: Radiation Oncology

## 2024-10-10 DIAGNOSIS — Z510 Encounter for antineoplastic radiation therapy: Secondary | ICD-10-CM | POA: Diagnosis not present

## 2024-10-10 LAB — RAD ONC ARIA SESSION SUMMARY
Course Elapsed Days: 8
Plan Fractions Treated to Date: 5
Plan Prescribed Dose Per Fraction: 2 Gy
Plan Total Fractions Prescribed: 25
Plan Total Prescribed Dose: 50 Gy
Reference Point Dosage Given to Date: 10 Gy
Reference Point Session Dosage Given: 2 Gy
Session Number: 5

## 2024-10-11 ENCOUNTER — Ambulatory Visit
Admission: RE | Admit: 2024-10-11 | Discharge: 2024-10-11 | Disposition: A | Source: Ambulatory Visit | Attending: Radiation Oncology | Admitting: Radiation Oncology

## 2024-10-11 ENCOUNTER — Other Ambulatory Visit: Payer: Self-pay

## 2024-10-11 DIAGNOSIS — Z510 Encounter for antineoplastic radiation therapy: Secondary | ICD-10-CM | POA: Diagnosis not present

## 2024-10-11 LAB — RAD ONC ARIA SESSION SUMMARY
Course Elapsed Days: 9
Plan Fractions Treated to Date: 6
Plan Prescribed Dose Per Fraction: 2 Gy
Plan Total Fractions Prescribed: 25
Plan Total Prescribed Dose: 50 Gy
Reference Point Dosage Given to Date: 12 Gy
Reference Point Session Dosage Given: 2 Gy
Session Number: 6

## 2024-10-14 ENCOUNTER — Ambulatory Visit

## 2024-10-15 ENCOUNTER — Ambulatory Visit

## 2024-10-16 ENCOUNTER — Other Ambulatory Visit: Payer: Self-pay

## 2024-10-16 ENCOUNTER — Ambulatory Visit
Admission: RE | Admit: 2024-10-16 | Discharge: 2024-10-16 | Disposition: A | Source: Ambulatory Visit | Attending: Radiation Oncology | Admitting: Radiation Oncology

## 2024-10-16 ENCOUNTER — Inpatient Hospital Stay: Attending: Oncology

## 2024-10-16 DIAGNOSIS — C50411 Malignant neoplasm of upper-outer quadrant of right female breast: Secondary | ICD-10-CM

## 2024-10-16 LAB — CBC (CANCER CENTER ONLY)
HCT: 38.8 % (ref 36.0–46.0)
Hemoglobin: 12.4 g/dL (ref 12.0–15.0)
MCH: 29.6 pg (ref 26.0–34.0)
MCHC: 32 g/dL (ref 30.0–36.0)
MCV: 92.6 fL (ref 80.0–100.0)
Platelet Count: 172 10*3/uL (ref 150–400)
RBC: 4.19 MIL/uL (ref 3.87–5.11)
RDW: 12.9 % (ref 11.5–15.5)
WBC Count: 5.7 10*3/uL (ref 4.0–10.5)
nRBC: 0 % (ref 0.0–0.2)

## 2024-10-16 LAB — RAD ONC ARIA SESSION SUMMARY
Course Elapsed Days: 14
Plan Fractions Treated to Date: 7
Plan Prescribed Dose Per Fraction: 2 Gy
Plan Total Fractions Prescribed: 25
Plan Total Prescribed Dose: 50 Gy
Reference Point Dosage Given to Date: 14 Gy
Reference Point Session Dosage Given: 2 Gy
Session Number: 7

## 2024-10-17 ENCOUNTER — Other Ambulatory Visit: Payer: Self-pay

## 2024-10-17 ENCOUNTER — Ambulatory Visit
Admission: RE | Admit: 2024-10-17 | Discharge: 2024-10-17 | Disposition: A | Source: Ambulatory Visit | Attending: Radiation Oncology | Admitting: Radiation Oncology

## 2024-10-17 LAB — RAD ONC ARIA SESSION SUMMARY
Course Elapsed Days: 15
Plan Fractions Treated to Date: 8
Plan Prescribed Dose Per Fraction: 2 Gy
Plan Total Fractions Prescribed: 25
Plan Total Prescribed Dose: 50 Gy
Reference Point Dosage Given to Date: 16 Gy
Reference Point Session Dosage Given: 2 Gy
Session Number: 8

## 2024-10-18 ENCOUNTER — Ambulatory Visit: Admission: RE | Admit: 2024-10-18 | Source: Ambulatory Visit

## 2024-10-18 ENCOUNTER — Other Ambulatory Visit: Payer: Self-pay

## 2024-10-18 LAB — RAD ONC ARIA SESSION SUMMARY
Course Elapsed Days: 16
Plan Fractions Treated to Date: 9
Plan Prescribed Dose Per Fraction: 2 Gy
Plan Total Fractions Prescribed: 25
Plan Total Prescribed Dose: 50 Gy
Reference Point Dosage Given to Date: 18 Gy
Reference Point Session Dosage Given: 2 Gy
Session Number: 9

## 2024-10-21 ENCOUNTER — Ambulatory Visit

## 2024-10-22 ENCOUNTER — Ambulatory Visit

## 2024-10-23 ENCOUNTER — Ambulatory Visit

## 2024-10-23 ENCOUNTER — Other Ambulatory Visit

## 2024-10-24 ENCOUNTER — Ambulatory Visit

## 2024-10-25 ENCOUNTER — Ambulatory Visit

## 2024-10-28 ENCOUNTER — Ambulatory Visit

## 2024-10-29 ENCOUNTER — Ambulatory Visit

## 2024-10-30 ENCOUNTER — Inpatient Hospital Stay: Admitting: Oncology

## 2024-10-30 ENCOUNTER — Ambulatory Visit

## 2024-10-30 ENCOUNTER — Inpatient Hospital Stay

## 2024-10-31 ENCOUNTER — Ambulatory Visit

## 2024-11-01 ENCOUNTER — Ambulatory Visit

## 2024-11-04 ENCOUNTER — Ambulatory Visit

## 2024-11-05 ENCOUNTER — Ambulatory Visit

## 2024-11-06 ENCOUNTER — Ambulatory Visit

## 2024-11-07 ENCOUNTER — Ambulatory Visit

## 2024-11-08 ENCOUNTER — Ambulatory Visit

## 2024-11-11 ENCOUNTER — Ambulatory Visit

## 2024-11-12 ENCOUNTER — Ambulatory Visit

## 2024-11-12 ENCOUNTER — Inpatient Hospital Stay: Admitting: *Deleted

## 2024-11-13 ENCOUNTER — Ambulatory Visit

## 2024-11-14 ENCOUNTER — Ambulatory Visit

## 2024-11-15 ENCOUNTER — Ambulatory Visit

## 2024-11-18 ENCOUNTER — Ambulatory Visit
